# Patient Record
Sex: Female | Born: 1960 | Race: White | Hispanic: No | Marital: Married | Smoking: Former smoker
Health system: Southern US, Community
[De-identification: ages and names within clinical notes are randomized; demographics above are authoritative.]

## PROBLEM LIST (undated history)

## (undated) DIAGNOSIS — F329 Major depressive disorder, single episode, unspecified: Secondary | ICD-10-CM

## (undated) DIAGNOSIS — F410 Panic disorder [episodic paroxysmal anxiety] without agoraphobia: Secondary | ICD-10-CM

## (undated) DIAGNOSIS — G4730 Sleep apnea, unspecified: Secondary | ICD-10-CM

## (undated) DIAGNOSIS — D649 Anemia, unspecified: Secondary | ICD-10-CM

## (undated) DIAGNOSIS — K5790 Diverticulosis of intestine, part unspecified, without perforation or abscess without bleeding: Secondary | ICD-10-CM

## (undated) DIAGNOSIS — F32A Depression, unspecified: Secondary | ICD-10-CM

## (undated) DIAGNOSIS — G4733 Obstructive sleep apnea (adult) (pediatric): Secondary | ICD-10-CM

## (undated) DIAGNOSIS — R42 Dizziness and giddiness: Secondary | ICD-10-CM

## (undated) DIAGNOSIS — K219 Gastro-esophageal reflux disease without esophagitis: Secondary | ICD-10-CM

## (undated) DIAGNOSIS — I10 Essential (primary) hypertension: Secondary | ICD-10-CM

## (undated) DIAGNOSIS — T753XXA Motion sickness, initial encounter: Secondary | ICD-10-CM

## (undated) DIAGNOSIS — M1990 Unspecified osteoarthritis, unspecified site: Secondary | ICD-10-CM

## (undated) DIAGNOSIS — E785 Hyperlipidemia, unspecified: Secondary | ICD-10-CM

## (undated) DIAGNOSIS — E7800 Pure hypercholesterolemia, unspecified: Secondary | ICD-10-CM

## (undated) DIAGNOSIS — T7840XA Allergy, unspecified, initial encounter: Secondary | ICD-10-CM

## (undated) DIAGNOSIS — Z8619 Personal history of other infectious and parasitic diseases: Secondary | ICD-10-CM

## (undated) DIAGNOSIS — C801 Malignant (primary) neoplasm, unspecified: Secondary | ICD-10-CM

## (undated) DIAGNOSIS — Z820 Family history of epilepsy and other diseases of the nervous system: Secondary | ICD-10-CM

## (undated) HISTORY — DX: Essential (primary) hypertension: I10

## (undated) HISTORY — DX: Family history of epilepsy and other diseases of the nervous system: Z82.0

## (undated) HISTORY — PX: FRACTURE SURGERY: SHX138

## (undated) HISTORY — DX: Major depressive disorder, single episode, unspecified: F32.9

## (undated) HISTORY — DX: Unspecified osteoarthritis, unspecified site: M19.90

## (undated) HISTORY — DX: Pure hypercholesterolemia, unspecified: E78.00

## (undated) HISTORY — PX: ESOPHAGOGASTRODUODENOSCOPY: SHX1529

## (undated) HISTORY — DX: Diverticulosis of intestine, part unspecified, without perforation or abscess without bleeding: K57.90

## (undated) HISTORY — DX: Anemia, unspecified: D64.9

## (undated) HISTORY — DX: Depression, unspecified: F32.A

## (undated) HISTORY — PX: ABDOMINAL HYSTERECTOMY: SHX81

## (undated) HISTORY — PX: COLONOSCOPY: SHX174

## (undated) HISTORY — DX: Allergy, unspecified, initial encounter: T78.40XA

## (undated) HISTORY — DX: Personal history of other infectious and parasitic diseases: Z86.19

---
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
## 1985-10-13 HISTORY — PX: OTHER SURGICAL HISTORY: SHX169

## 1991-10-14 HISTORY — PX: TUBAL LIGATION: SHX77

## 2004-10-13 HISTORY — PX: LAPAROSCOPIC SUPRACERVICAL HYSTERECTOMY: SUR797

## 2005-01-07 ENCOUNTER — Ambulatory Visit: Payer: Self-pay | Admitting: Internal Medicine

## 2005-05-13 ENCOUNTER — Ambulatory Visit: Payer: Self-pay | Admitting: Internal Medicine

## 2005-06-30 ENCOUNTER — Ambulatory Visit: Payer: Self-pay | Admitting: Obstetrics and Gynecology

## 2006-08-31 ENCOUNTER — Ambulatory Visit: Payer: Self-pay | Admitting: Internal Medicine

## 2006-09-04 ENCOUNTER — Emergency Department: Payer: Self-pay | Admitting: Emergency Medicine

## 2006-09-07 ENCOUNTER — Ambulatory Visit: Payer: Self-pay | Admitting: General Practice

## 2006-09-08 ENCOUNTER — Other Ambulatory Visit: Payer: Self-pay

## 2007-09-03 ENCOUNTER — Ambulatory Visit: Payer: Self-pay | Admitting: Internal Medicine

## 2007-10-19 ENCOUNTER — Ambulatory Visit: Payer: Self-pay | Admitting: Gastroenterology

## 2007-10-21 ENCOUNTER — Ambulatory Visit: Payer: Self-pay | Admitting: Internal Medicine

## 2007-10-27 ENCOUNTER — Ambulatory Visit: Payer: Self-pay | Admitting: Gastroenterology

## 2007-12-07 ENCOUNTER — Ambulatory Visit: Payer: Self-pay | Admitting: Gastroenterology

## 2008-03-01 ENCOUNTER — Emergency Department: Payer: Self-pay | Admitting: Emergency Medicine

## 2008-09-04 ENCOUNTER — Ambulatory Visit: Payer: Self-pay | Admitting: Internal Medicine

## 2008-09-12 ENCOUNTER — Ambulatory Visit: Payer: Self-pay | Admitting: Gastroenterology

## 2008-12-26 ENCOUNTER — Ambulatory Visit: Payer: Self-pay | Admitting: Orthopedic Surgery

## 2009-01-02 ENCOUNTER — Ambulatory Visit: Payer: Self-pay | Admitting: Orthopedic Surgery

## 2009-09-10 ENCOUNTER — Ambulatory Visit: Payer: Self-pay | Admitting: Internal Medicine

## 2009-09-10 LAB — HM MAMMOGRAPHY

## 2009-11-09 ENCOUNTER — Ambulatory Visit: Payer: Self-pay | Admitting: Orthopedic Surgery

## 2009-11-16 ENCOUNTER — Ambulatory Visit: Payer: Self-pay | Admitting: Orthopedic Surgery

## 2011-09-08 ENCOUNTER — Ambulatory Visit: Payer: Self-pay | Admitting: Internal Medicine

## 2012-04-29 LAB — HM PAP SMEAR

## 2012-04-30 LAB — BASIC METABOLIC PANEL
Creatinine: 0.8 mg/dL (ref <=1.1)
Glucose: 88 mg/dL
Potassium: 4.9 mmol/L (ref 3.4–5.3)
Sodium: 137 mmol/L (ref 137–147)

## 2012-04-30 LAB — TSH: TSH: 1.05 u[IU]/mL (ref <=5.90)

## 2012-04-30 LAB — LIPID PANEL
Cholesterol: 155 mg/dL (ref 0–200)
HDL: 57 mg/dL (ref 35–70)

## 2012-04-30 LAB — HEPATIC FUNCTION PANEL: AST: 25 U/L (ref 13–35)

## 2012-10-14 ENCOUNTER — Other Ambulatory Visit: Payer: Self-pay | Admitting: Internal Medicine

## 2012-10-14 MED ORDER — AMLODIPINE BESYLATE 5 MG PO TABS: 5.0000 mg | ORAL_TABLET | Freq: Two times a day (BID) | ORAL | Status: DC

## 2012-10-14 NOTE — Telephone Encounter (Signed)
Sent in to pharmacy.  

## 2012-10-14 NOTE — Telephone Encounter (Signed)
Patient has an appointment on 3.18.14  Amlodipine Besylate 5 mg  Take 1 tablet by mouth twice daily  # 60

## 2012-12-21 ENCOUNTER — Encounter: Payer: Self-pay | Admitting: *Deleted

## 2012-12-23 ENCOUNTER — Encounter: Payer: Self-pay | Admitting: *Deleted

## 2012-12-28 ENCOUNTER — Ambulatory Visit: Payer: Self-pay | Admitting: Internal Medicine

## 2013-01-05 ENCOUNTER — Other Ambulatory Visit: Payer: Self-pay | Admitting: *Deleted

## 2013-01-06 MED ORDER — SPIRONOLACTONE 50 MG PO TABS: 50.0000 mg | ORAL_TABLET | Freq: Every day | ORAL | Status: DC

## 2013-01-06 NOTE — Telephone Encounter (Signed)
Rx sent in to pharmacy. 

## 2013-02-01 ENCOUNTER — Ambulatory Visit: Payer: Self-pay | Admitting: Internal Medicine

## 2013-02-09 ENCOUNTER — Ambulatory Visit (INDEPENDENT_AMBULATORY_CARE_PROVIDER_SITE_OTHER): Payer: Managed Care, Other (non HMO) | Admitting: Internal Medicine

## 2013-02-09 ENCOUNTER — Encounter: Payer: Self-pay | Admitting: Internal Medicine

## 2013-02-09 VITALS — BP 130/90 | HR 92 | Temp 98.9°F | Ht 68.0 in | Wt 271.8 lb

## 2013-02-09 DIAGNOSIS — F419 Anxiety disorder, unspecified: Secondary | ICD-10-CM

## 2013-02-09 DIAGNOSIS — E7800 Pure hypercholesterolemia, unspecified: Secondary | ICD-10-CM

## 2013-02-09 DIAGNOSIS — K5790 Diverticulosis of intestine, part unspecified, without perforation or abscess without bleeding: Secondary | ICD-10-CM

## 2013-02-09 DIAGNOSIS — I10 Essential (primary) hypertension: Secondary | ICD-10-CM

## 2013-02-09 DIAGNOSIS — F411 Generalized anxiety disorder: Secondary | ICD-10-CM

## 2013-02-09 DIAGNOSIS — K5730 Diverticulosis of large intestine without perforation or abscess without bleeding: Secondary | ICD-10-CM

## 2013-02-09 LAB — BASIC METABOLIC PANEL
BUN: 10 mg/dL (ref 6–23)
Calcium: 9.2 mg/dL (ref 8.4–10.5)
GFR: 136.7 mL/min (ref >60.00)
Potassium: 4.4 mEq/L (ref 3.5–5.1)
Sodium: 137 mEq/L (ref 135–145)

## 2013-02-09 LAB — CBC WITH DIFFERENTIAL/PLATELET
Basophils Relative: 0.9 % (ref 0.0–3.0)
Eosinophils Absolute: 0.1 10*3/uL (ref 0.0–0.7)
MCHC: 34.2 g/dL (ref 30.0–36.0)
MCV: 96.4 fl (ref 78.0–100.0)
Monocytes Absolute: 0.5 10*3/uL (ref 0.1–1.0)
Neutrophils Relative %: 52.1 % (ref 43.0–77.0)
Platelets: 243 10*3/uL (ref 150.0–400.0)

## 2013-02-09 LAB — HEPATIC FUNCTION PANEL
ALT: 20 U/L (ref 0–35)
AST: 24 U/L (ref 0–37)
Bilirubin, Direct: 0.1 mg/dL (ref 0.0–0.3)
Total Protein: 7.5 g/dL (ref 6.0–8.3)

## 2013-02-09 LAB — LIPID PANEL
Cholesterol: 175 mg/dL (ref 0–200)
VLDL: 25.6 mg/dL (ref 0.0–40.0)

## 2013-02-09 MED ORDER — ALPRAZOLAM 0.25 MG PO TABS: 0.2500 mg | ORAL_TABLET | Freq: Two times a day (BID) | ORAL | Status: DC | PRN

## 2013-02-10 ENCOUNTER — Encounter: Payer: Self-pay | Admitting: Internal Medicine

## 2013-02-10 ENCOUNTER — Ambulatory Visit: Payer: Managed Care, Other (non HMO)

## 2013-02-10 DIAGNOSIS — E039 Hypothyroidism, unspecified: Secondary | ICD-10-CM

## 2013-02-10 DIAGNOSIS — I10 Essential (primary) hypertension: Secondary | ICD-10-CM | POA: Insufficient documentation

## 2013-02-10 DIAGNOSIS — F419 Anxiety disorder, unspecified: Secondary | ICD-10-CM | POA: Insufficient documentation

## 2013-02-10 DIAGNOSIS — E7800 Pure hypercholesterolemia, unspecified: Secondary | ICD-10-CM | POA: Insufficient documentation

## 2013-02-10 DIAGNOSIS — K5790 Diverticulosis of intestine, part unspecified, without perforation or abscess without bleeding: Secondary | ICD-10-CM | POA: Insufficient documentation

## 2013-02-10 LAB — T4, FREE: Free T4: 0.63 ng/dL (ref 0.60–1.60)

## 2013-02-10 NOTE — Assessment & Plan Note (Signed)
Colonoscopy 12/09 - internal hemorrhoids and diverticulosis.  Currently asymptomatic.  Recommended f/u colonoscopy 2019.    
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
## 2013-02-10 NOTE — Assessment & Plan Note (Signed)
Blood pressure slightly elevated today.  Have her check and record her pressure and send in over the next few weeks.  Hold on making changes in her medication.  Follow.  Check metabolic panel.

## 2013-02-10 NOTE — Assessment & Plan Note (Signed)
Remains on Vytorin.  Low cholesterol diet and exercise.  Check lipid panel and liver function.

## 2013-02-10 NOTE — Assessment & Plan Note (Signed)
Increased anxiety with the weather as outlined.  On citalopram and has done well on this medication.  Will give her xanax .25mg  to have if needed.  Discussed referral to a counselor/psychiatrist.  She wants to hold at this point.  Follow.  Notify me if worsening symptoms.

## 2013-02-11 ENCOUNTER — Other Ambulatory Visit: Payer: Self-pay | Admitting: Internal Medicine

## 2013-02-11 ENCOUNTER — Encounter: Payer: Self-pay | Admitting: *Deleted

## 2013-02-11 DIAGNOSIS — R7989 Other specified abnormal findings of blood chemistry: Secondary | ICD-10-CM

## 2013-02-11 NOTE — Telephone Encounter (Signed)
Rx sent to pharmacy by escript  

## 2013-02-11 NOTE — Progress Notes (Signed)
Follow up thyroid function tests ordered.

## 2013-02-28 ENCOUNTER — Other Ambulatory Visit: Payer: Self-pay | Admitting: Internal Medicine

## 2013-02-28 ENCOUNTER — Encounter: Payer: Self-pay | Admitting: Internal Medicine

## 2013-02-28 NOTE — Telephone Encounter (Signed)
Rx sent to pharmacy by escript  

## 2013-03-01 ENCOUNTER — Other Ambulatory Visit: Payer: Self-pay | Admitting: Internal Medicine

## 2013-03-01 NOTE — Telephone Encounter (Signed)
Ok to refill 

## 2013-03-01 NOTE — Telephone Encounter (Signed)
Rx faxed to pharmacy  

## 2013-03-15 ENCOUNTER — Other Ambulatory Visit (INDEPENDENT_AMBULATORY_CARE_PROVIDER_SITE_OTHER): Payer: Managed Care, Other (non HMO)

## 2013-03-15 DIAGNOSIS — R7989 Other specified abnormal findings of blood chemistry: Secondary | ICD-10-CM

## 2013-03-15 DIAGNOSIS — R946 Abnormal results of thyroid function studies: Secondary | ICD-10-CM

## 2013-03-15 LAB — TSH: TSH: 0.45 u[IU]/mL (ref 0.35–5.50)

## 2013-03-15 LAB — T3, FREE: T3, Free: 2.8 pg/mL (ref 2.3–4.2)

## 2013-03-16 ENCOUNTER — Encounter: Payer: Self-pay | Admitting: Internal Medicine

## 2013-03-21 ENCOUNTER — Telehealth: Payer: Self-pay

## 2013-03-21 NOTE — Telephone Encounter (Signed)
My Chart Message: I reviewed your recent lab results. Your thyroid function tests are within normal limits.   Patient notified of recent thyroid function tests are within normal limits

## 2013-03-31 ENCOUNTER — Encounter: Payer: Self-pay | Admitting: Internal Medicine

## 2013-04-14 ENCOUNTER — Encounter: Payer: Self-pay | Admitting: Internal Medicine

## 2013-04-18 ENCOUNTER — Other Ambulatory Visit: Payer: Self-pay | Admitting: *Deleted

## 2013-04-18 MED ORDER — PANTOPRAZOLE SODIUM 40 MG PO TBEC: 40.0000 mg | DELAYED_RELEASE_TABLET | Freq: Two times a day (BID) | ORAL | Status: DC

## 2013-04-19 ENCOUNTER — Telehealth: Payer: Self-pay | Admitting: *Deleted

## 2013-04-19 NOTE — Telephone Encounter (Signed)
Called [phone] for Prior authorization on the Pantoprazole 40 mg, form is being faxed over

## 2013-04-21 ENCOUNTER — Telehealth: Payer: Self-pay | Admitting: *Deleted

## 2013-04-21 NOTE — Telephone Encounter (Signed)
Richard @ [facility] called to check on the status of the Pantoprazole PA  (Member ID# [id number])

## 2013-04-21 NOTE — Telephone Encounter (Signed)
Waiting for the PA request form from the insurance

## 2013-04-22 NOTE — Telephone Encounter (Signed)
Received PA request, placed in Dr. Lorin Picket folder

## 2013-04-26 ENCOUNTER — Other Ambulatory Visit: Payer: Self-pay | Admitting: Internal Medicine

## 2013-04-27 ENCOUNTER — Other Ambulatory Visit: Payer: Self-pay | Admitting: Internal Medicine

## 2013-05-02 ENCOUNTER — Ambulatory Visit (INDEPENDENT_AMBULATORY_CARE_PROVIDER_SITE_OTHER): Payer: Managed Care, Other (non HMO) | Admitting: Internal Medicine

## 2013-05-02 ENCOUNTER — Encounter: Payer: Self-pay | Admitting: Internal Medicine

## 2013-05-02 ENCOUNTER — Telehealth: Payer: Self-pay | Admitting: *Deleted

## 2013-05-02 VITALS — BP 130/80 | HR 95 | Temp 98.5°F | Ht 68.0 in | Wt 267.2 lb

## 2013-05-02 DIAGNOSIS — K5790 Diverticulosis of intestine, part unspecified, without perforation or abscess without bleeding: Secondary | ICD-10-CM

## 2013-05-02 DIAGNOSIS — F411 Generalized anxiety disorder: Secondary | ICD-10-CM

## 2013-05-02 DIAGNOSIS — E7800 Pure hypercholesterolemia, unspecified: Secondary | ICD-10-CM

## 2013-05-02 DIAGNOSIS — I10 Essential (primary) hypertension: Secondary | ICD-10-CM

## 2013-05-02 DIAGNOSIS — F419 Anxiety disorder, unspecified: Secondary | ICD-10-CM

## 2013-05-02 DIAGNOSIS — K5730 Diverticulosis of large intestine without perforation or abscess without bleeding: Secondary | ICD-10-CM

## 2013-05-02 DIAGNOSIS — Z23 Encounter for immunization: Secondary | ICD-10-CM
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
## 2013-05-02 NOTE — Assessment & Plan Note (Signed)
Colonoscopy 12/09 - internal hemorrhoids and diverticulosis.  Currently asymptomatic.  Recommended f/u colonoscopy 2019.    

## 2013-05-02 NOTE — Addendum Note (Signed)
Addended by: Warden Fillers on: 05/02/2013 11:19 AM   Modules accepted: Orders

## 2013-05-02 NOTE — Telephone Encounter (Signed)
I think you have this form.  Thanks.
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
## 2013-05-02 NOTE — Assessment & Plan Note (Signed)
Remains on Vytorin.  Low cholesterol diet and exercise.  Follow lipid panel and liver function.   

## 2013-05-02 NOTE — Assessment & Plan Note (Signed)
Blood pressure better.  Follow metabolic panel.    

## 2013-05-02 NOTE — Telephone Encounter (Signed)
Pt wanted to check on the status of her Protonix Prior authorization-pt in office now for a OV.

## 2013-05-04 ENCOUNTER — Encounter: Payer: Self-pay | Admitting: *Deleted

## 2013-05-09 ENCOUNTER — Encounter: Payer: Managed Care, Other (non HMO) | Admitting: Internal Medicine

## 2013-05-10 ENCOUNTER — Other Ambulatory Visit: Payer: Self-pay | Admitting: Internal Medicine

## 2013-06-06 ENCOUNTER — Encounter: Payer: Self-pay | Admitting: Internal Medicine

## 2013-06-14 ENCOUNTER — Encounter: Payer: Managed Care, Other (non HMO) | Admitting: Internal Medicine

## 2013-06-21 ENCOUNTER — Other Ambulatory Visit (INDEPENDENT_AMBULATORY_CARE_PROVIDER_SITE_OTHER): Payer: Managed Care, Other (non HMO)

## 2013-06-21 DIAGNOSIS — F411 Generalized anxiety disorder: Secondary | ICD-10-CM

## 2013-06-21 DIAGNOSIS — E7800 Pure hypercholesterolemia, unspecified: Secondary | ICD-10-CM

## 2013-06-21 DIAGNOSIS — I10 Essential (primary) hypertension: Secondary | ICD-10-CM

## 2013-06-21 DIAGNOSIS — F419 Anxiety disorder, unspecified: Secondary | ICD-10-CM

## 2013-06-21 LAB — LIPID PANEL
Cholesterol: 169 mg/dL (ref 0–200)
HDL: 49.1 mg/dL (ref >39.00)
LDL Cholesterol: 83 mg/dL (ref 0–99)
Triglycerides: 187 mg/dL — ABNORMAL HIGH (ref 0.0–149.0)
VLDL: 37.4 mg/dL (ref 0.0–40.0)

## 2013-06-21 LAB — TSH: TSH: 0.53 u[IU]/mL (ref 0.35–5.50)

## 2013-06-21 LAB — HEPATIC FUNCTION PANEL
Albumin: 4.5 g/dL (ref 3.5–5.2)
Bilirubin, Direct: 0.1 mg/dL (ref 0.0–0.3)
Total Protein: 7.6 g/dL (ref 6.0–8.3)

## 2013-06-21 LAB — BASIC METABOLIC PANEL
BUN: 14 mg/dL (ref 6–23)
CO2: 27 mEq/L (ref 19–32)
Chloride: 103 mEq/L (ref 96–112)
Creatinine, Ser: 0.5 mg/dL (ref 0.4–1.2)
Glucose, Bld: 96 mg/dL (ref 70–99)

## 2013-06-23 ENCOUNTER — Telehealth: Payer: Self-pay | Admitting: Internal Medicine

## 2013-06-23 NOTE — Telephone Encounter (Signed)
Hold form until her physical.

## 2013-06-23 NOTE — Telephone Encounter (Signed)
Pt dropped off health screening form  In box

## 2013-06-23 NOTE — Telephone Encounter (Signed)
In your folder-I completed the lab portion. She has an appt on 9/16 for physical (in case you want me to hold it until then)

## 2013-06-24 NOTE — Telephone Encounter (Signed)
Noted & pt aware also

## 2013-06-28 ENCOUNTER — Other Ambulatory Visit (HOSPITAL_COMMUNITY)
Admission: RE | Admit: 2013-06-28 | Discharge: 2013-06-28 | Disposition: A | Discharge status: Home / Self Care | Payer: Managed Care, Other (non HMO) | Source: Ambulatory Visit | Attending: Internal Medicine | Admitting: Internal Medicine

## 2013-06-28 ENCOUNTER — Encounter: Payer: Self-pay | Admitting: Internal Medicine

## 2013-06-28 ENCOUNTER — Ambulatory Visit (INDEPENDENT_AMBULATORY_CARE_PROVIDER_SITE_OTHER): Payer: Managed Care, Other (non HMO) | Admitting: Internal Medicine

## 2013-06-28 VITALS — BP 120/80 | HR 82 | Temp 98.7°F | Ht 67.75 in | Wt 267.5 lb

## 2013-06-28 DIAGNOSIS — Z124 Encounter for screening for malignant neoplasm of cervix: Secondary | ICD-10-CM

## 2013-06-28 DIAGNOSIS — Z1151 Encounter for screening for human papillomavirus (HPV): Secondary | ICD-10-CM | POA: Insufficient documentation

## 2013-06-28 DIAGNOSIS — R079 Chest pain, unspecified: Secondary | ICD-10-CM

## 2013-06-28 DIAGNOSIS — K5730 Diverticulosis of large intestine without perforation or abscess without bleeding: Secondary | ICD-10-CM

## 2013-06-28 DIAGNOSIS — E7800 Pure hypercholesterolemia, unspecified: Secondary | ICD-10-CM

## 2013-06-28 DIAGNOSIS — Z23 Encounter for immunization: Secondary | ICD-10-CM

## 2013-06-28 DIAGNOSIS — F419 Anxiety disorder, unspecified: Secondary | ICD-10-CM

## 2013-06-28 DIAGNOSIS — F411 Generalized anxiety disorder: Secondary | ICD-10-CM

## 2013-06-28 DIAGNOSIS — K5790 Diverticulosis of intestine, part unspecified, without perforation or abscess without bleeding: Secondary | ICD-10-CM

## 2013-06-28 DIAGNOSIS — Z01419 Encounter for gynecological examination (general) (routine) without abnormal findings: Secondary | ICD-10-CM | POA: Insufficient documentation

## 2013-06-28 DIAGNOSIS — I10 Essential (primary) hypertension: Secondary | ICD-10-CM

## 2013-07-01 ENCOUNTER — Encounter: Payer: Self-pay | Admitting: Internal Medicine

## 2013-07-02 ENCOUNTER — Other Ambulatory Visit: Payer: Self-pay | Admitting: Internal Medicine

## 2013-07-02 ENCOUNTER — Encounter: Payer: Self-pay | Admitting: Internal Medicine

## 2013-07-02 NOTE — Assessment & Plan Note (Signed)
Blood pressure better.  Follow metabolic panel.    

## 2013-07-02 NOTE — Progress Notes (Signed)
Subjective:    [name], female    DOB: 03-30-1961, 52 y.o.   MRN: [id number]  HPI 52 year old female with past history of GERD, hypertension and hypercholesterolemia who comes in today to follow up on these issues as well as for a complete physical exam.  She states she has been doing relatively well.  Her daughter and granddaughter have moved to [place].  States she is coping well with this.  No increased cough or congestion.  Does report some sob with exertion.  Unable to do a lot of walking secondary to increased sob.  Some chest pressure with exertion.  No acid reflux.  Bowels stable.  No depression.  Anxiety better.  Overall feels good.     Past Medical History  Diagnosis Date  . Hypercholesterolemia   . Diverticulosis   . Anemia   . Hypertension   . Depression   . Allergy   . History of chickenpox   . Osteoarthritis   . FHx: migraine headaches     Outpatient Encounter Prescriptions as of 06/28/2013  Medication Sig Dispense Refill  . acetaminophen (TYLENOL) 650 MG CR tablet Take 650 mg by mouth every 8 (eight) hours as needed for pain.      Marland Kitchen ALPRAZolam (XANAX) 0.25 MG tablet TAKE 1 TABLET BY MOUTH TWICE DAILY AS NEEDED FOR SLEEP  20 tablet  0  . amLODipine (NORVASC) 5 MG tablet TAKE 1 TABLET BY MOUTH TWICE DAILY  60 tablet  5  . atorvastatin (LIPITOR) 20 MG tablet Take 1 tablet (20 mg total) by mouth daily.  30 tablet  5  . Calcium Carbonate-Vit D-Min (CALCIUM 1200 PO) Take by mouth daily.      . Cholecalciferol (VITAMIN D PO) Take 800 Units by mouth daily.      . citalopram (CELEXA) 40 MG tablet TAKE ONE TABLET BY MOUTH DAILY  30 tablet  2  . Lactase (LACTAID FAST ACT) 9000 UNITS TABS Take by mouth.      Marland Kitchen lisinopril (PRINIVIL,ZESTRIL) 40 MG tablet TAKE 1 TABLET BY MOUTH EVERY DAY  30 tablet  5  . Methylcellulose, Laxative, (CITRUCEL PO) Take by mouth.      . metoprolol succinate (TOPROL-XL) 50 MG 24 hr tablet TAKE ONE TABLET BY MOUTH TWICE DAILY  60 tablet  5   . pantoprazole (PROTONIX) 40 MG tablet Take 1 tablet (40 mg total) by mouth 2 (two) times daily.  60 tablet  5  . spironolactone (ALDACTONE) 50 MG tablet TAKE 1 TABLET BY MOUTH EVERY DAY  30 tablet  5   No facility-administered encounter medications on file as of 06/28/2013.    Review of Systems Patient denies any headache, lightheadedness or dizziness.  No cough or congestion.  Some sob with exertion.  Some chest pressure with exertion.  No nausea or vomiting.  No acid reflux.  No abdominal pain or cramping.  No bowel change, such as diarrhea, constipation, BRBPR or melana.  No urine change.  Anxiety better.  No depression.        Objective:   Physical Exam  Filed Vitals:   06/28/13 1537  BP: 120/80  Pulse: 82  Temp: 98.7 F (36.69 C)   52 year old female in no acute distress.   HEENT:  Nares- clear.  Oropharynx - without lesions. NECK:  Supple.  Nontender.  No audible bruit.  HEART:  Appears to be regular. LUNGS:  No crackles or wheezing audible.  Respirations even and unlabored.  RADIAL  PULSE:  Equal bilaterally.    BREASTS:  No nipple discharge or nipple retraction present.  Could not appreciate any distinct nodules or axillary adenopathy.  ABDOMEN:  Soft, nontender.  Bowel sounds present and normal.  No audible abdominal bruit.  GU:  Normal external genitalia.  Vaginal vault without lesions.  Cervix identified.  Pap performed. Could not appreciate any adnexal masses or tenderness.   RECTAL:  Heme negative.   EXTREMITIES:  No increased edema present.  DP pulses palpable and equal bilaterally.          Assessment & Plan:  CARDIOVASCULAR.  Sob and chest tightness with exertion.  Some worsening symptoms.  EKG obtained and revealed SR with TWI in Lead III and flattening in aVF.  Given risk factors, symptoms and EKG - obtain stress test to confirm no active ischemia.  Continue risk factor modification.    PULMONARY.  Some sob with exertion.  Pursue cardiac w/up as outlined.      MSK.  Saw Dr Rosita Kea. S/p injection of her shoulder.  Did not help.  Saw Dr Yves Dill.  Repeat injection.  Pain resolved.    HEALTH MAINTENANCE.  Physical today.  Colonoscopy 09/12/08 - internal hemorrhoids and diverticulosis.  Recommended f/u colonoscopy 2019.  Had mammogram 02/01/13 - Birads I.

## 2013-07-02 NOTE — Assessment & Plan Note (Signed)
Remains on Vytorin.  Low cholesterol diet and exercise.  Follow lipid panel and liver function.   

## 2013-07-02 NOTE — Assessment & Plan Note (Signed)
Colonoscopy 12/09 - internal hemorrhoids and diverticulosis.  Currently asymptomatic.  Recommended f/u colonoscopy 2019.    

## 2013-07-02 NOTE — Assessment & Plan Note (Signed)
Anxiety better.  On citalopram and has done well on this medication.  She has xanax .25mg  to have if needed.  Works well for her.  Rarely takes.  Follow.

## 2013-07-04 NOTE — Telephone Encounter (Signed)
Mailed unread message to pt  

## 2013-07-21 ENCOUNTER — Other Ambulatory Visit: Payer: Self-pay | Admitting: Internal Medicine

## 2013-07-22 ENCOUNTER — Other Ambulatory Visit (INDEPENDENT_AMBULATORY_CARE_PROVIDER_SITE_OTHER): Payer: Managed Care, Other (non HMO)

## 2013-07-22 DIAGNOSIS — R079 Chest pain, unspecified: Secondary | ICD-10-CM

## 2013-08-07 ENCOUNTER — Other Ambulatory Visit: Payer: Self-pay | Admitting: Internal Medicine

## 2013-08-19 ENCOUNTER — Other Ambulatory Visit: Payer: Self-pay | Admitting: Internal Medicine

## 2013-09-01 ENCOUNTER — Ambulatory Visit (INDEPENDENT_AMBULATORY_CARE_PROVIDER_SITE_OTHER): Payer: Managed Care, Other (non HMO) | Admitting: Internal Medicine

## 2013-09-01 ENCOUNTER — Encounter: Payer: Self-pay | Admitting: Internal Medicine

## 2013-09-01 VITALS — BP 118/78 | HR 74 | Temp 98.1°F | Wt 271.2 lb

## 2013-09-01 DIAGNOSIS — K5730 Diverticulosis of large intestine without perforation or abscess without bleeding: Secondary | ICD-10-CM

## 2013-09-01 DIAGNOSIS — E7800 Pure hypercholesterolemia, unspecified: Secondary | ICD-10-CM

## 2013-09-01 DIAGNOSIS — I10 Essential (primary) hypertension: Secondary | ICD-10-CM

## 2013-09-01 DIAGNOSIS — F411 Generalized anxiety disorder: Secondary | ICD-10-CM

## 2013-09-01 DIAGNOSIS — F419 Anxiety disorder, unspecified: Secondary | ICD-10-CM

## 2013-09-01 DIAGNOSIS — K5790 Diverticulosis of intestine, part unspecified, without perforation or abscess without bleeding: Secondary | ICD-10-CM

## 2013-09-01 MED ORDER — ALPRAZOLAM 0.25 MG PO TABS: ORAL_TABLET | ORAL | Status: DC

## 2013-09-01 NOTE — Progress Notes (Signed)
Pre-visit discussion using our clinic review tool. No additional management support is needed unless otherwise documented below in the visit note.  

## 2013-09-04 ENCOUNTER — Encounter: Payer: Self-pay | Admitting: Internal Medicine

## 2013-09-04 NOTE — Progress Notes (Signed)
Subjective:    [name], female    DOB: February 01, 1961, 52 y.o.   MRN: [id number]  HPI 52 year old female with past history of GERD, hypertension and hypercholesterolemia who comes in today for a scheduled follow up.  She states she has been doing relatively well.  Doing better.  Stress better.   No increased cough or congestion.  Did not report any sob today.  No chest pain or tightness.  Recent stress test negative.   No acid reflux.  Bowels stable.  No depression.  Anxiety better.  Overall feels good.     Past Medical History  Diagnosis Date  . Hypercholesterolemia   . Diverticulosis   . Anemia   . Hypertension   . Depression   . Allergy   . History of chickenpox   . Osteoarthritis   . FHx: migraine headaches     Outpatient Encounter Prescriptions as of 09/01/2013  Medication Sig  . acetaminophen (TYLENOL) 650 MG CR tablet Take 650 mg by mouth every 8 (eight) hours as needed for pain.  Marland Kitchen ALPRAZolam (XANAX) 0.25 MG tablet TAKE 1 TABLET BY MOUTH TWICE DAILY AS NEEDED FOR SLEEP  . amLODipine (NORVASC) 5 MG tablet TAKE 1 TABLET BY MOUTH TWICE DAILY  . atorvastatin (LIPITOR) 20 MG tablet Take 1 tablet (20 mg total) by mouth daily.  . citalopram (CELEXA) 40 MG tablet TAKE 1 TABLET BY MOUTH EVERY DAY  . Lactase (LACTAID FAST ACT) 9000 UNITS TABS Take by mouth.  Marland Kitchen lisinopril (PRINIVIL,ZESTRIL) 40 MG tablet TAKE 1 TABLET BY MOUTH EVERY DAY  . metoprolol succinate (TOPROL-XL) 50 MG 24 hr tablet TAKE ONE TABLET BY MOUTH TWICE DAILY  . pantoprazole (PROTONIX) 40 MG tablet Take 40 mg by mouth daily.  . ranitidine (ZANTAC) 150 MG tablet Take 150 mg by mouth at bedtime.  Marland Kitchen spironolactone (ALDACTONE) 50 MG tablet TAKE 1 TABLET BY MOUTH EVERY DAY  . [DISCONTINUED] ALPRAZolam (XANAX) 0.25 MG tablet TAKE 1 TABLET BY MOUTH TWICE DAILY AS NEEDED FOR SLEEP  . [DISCONTINUED] pantoprazole (PROTONIX) 40 MG tablet Take 1 tablet (40 mg total) by mouth 2 (two) times daily.  . [DISCONTINUED]  Calcium Carbonate-Vit D-Min (CALCIUM 1200 PO) Take by mouth daily.  . [DISCONTINUED] Cholecalciferol (VITAMIN D PO) Take 800 Units by mouth daily.  . [DISCONTINUED] Methylcellulose, Laxative, (CITRUCEL PO) Take by mouth.    Review of Systems Patient denies any headache, lightheadedness or dizziness.  No cough or congestion.  Breathing stable.  No chest pain or pressure reported.   No nausea or vomiting.  No acid reflux.  No abdominal pain or cramping.  No bowel change, such as diarrhea, constipation, BRBPR or melana.  No urine change.  Anxiety better.  No depression.        Objective:   Physical Exam  Filed Vitals:   09/01/13 1123  BP: 118/78  Pulse: 74  Temp: 98.1 F (38.13 C)   52 year old female in no acute distress.   HEENT:  Nares- clear.  Oropharynx - without lesions. NECK:  Supple.  Nontender.  No audible bruit.  HEART:  Appears to be regular. LUNGS:  No crackles or wheezing audible.  Respirations even and unlabored.  RADIAL PULSE:  Equal bilaterally.  ABDOMEN:  Soft, nontender.  Bowel sounds present and normal.  No audible abdominal bruit.    EXTREMITIES:  No increased edema present.  DP pulses palpable and equal bilaterally.          Assessment &  Plan:  CARDIOVASCULAR.  No chest pain or tightness reported today.  Recent stress test negative.      PULMONARY.  Breathing ok.  No sob reported today.      MSK.  Saw Dr Rosita Kea. S/p injection of her shoulder.  Did not help.  Saw Dr Yves Dill.  Repeat injection.  Pain resolved.    HEALTH MAINTENANCE.  Physical last visit.   Colonoscopy 09/12/08 - internal hemorrhoids and diverticulosis.  Recommended f/u colonoscopy 2019.  Had mammogram 02/01/13 - Birads I.

## 2013-09-04 NOTE — Assessment & Plan Note (Signed)
Anxiety better.  On citalopram and has done well on this medication.  She has xanax .25mg  to have if needed.  Works well for her.  Rarely takes.  Follow.

## 2013-09-04 NOTE — Assessment & Plan Note (Signed)
Colonoscopy 12/09 - internal hemorrhoids and diverticulosis.  Currently asymptomatic.  Recommended f/u colonoscopy 2019.    

## 2013-09-04 NOTE — Assessment & Plan Note (Signed)
Remains on Vytorin.  Low cholesterol diet and exercise.  Follow lipid panel and liver function.   

## 2013-09-04 NOTE — Assessment & Plan Note (Signed)
Blood pressure better.  Follow metabolic panel.    

## 2013-09-05 ENCOUNTER — Other Ambulatory Visit: Payer: Self-pay | Admitting: Internal Medicine

## 2013-09-05 MED ORDER — PANTOPRAZOLE SODIUM 40 MG PO TBEC: 40.0000 mg | DELAYED_RELEASE_TABLET | Freq: Every day | ORAL | Status: DC

## 2013-09-05 NOTE — Telephone Encounter (Signed)
Rx was printed 09/01/13, left message for pt to return call to see if this refill is necessary

## 2013-09-05 NOTE — Telephone Encounter (Signed)
Pt states that she never received the Xanax Rx at her visit on 09/01/13

## 2013-10-07 ENCOUNTER — Other Ambulatory Visit: Payer: Self-pay | Admitting: Internal Medicine

## 2013-10-07 NOTE — Telephone Encounter (Signed)
Rx phoned to pharmacy.  

## 2013-10-07 NOTE — Telephone Encounter (Signed)
ok'd #20 xanax with no refills.

## 2013-10-07 NOTE — Telephone Encounter (Signed)
Last visit 09/01/13, last refill 09/05/13 #20

## 2013-10-17 ENCOUNTER — Other Ambulatory Visit: Payer: Self-pay | Admitting: Internal Medicine

## 2013-11-11 ENCOUNTER — Other Ambulatory Visit: Payer: Self-pay | Admitting: Internal Medicine

## 2013-11-11 NOTE — Telephone Encounter (Signed)
Refilled xanax #20 with no refills.   

## 2013-11-29 ENCOUNTER — Other Ambulatory Visit: Payer: Managed Care, Other (non HMO)

## 2013-12-02 ENCOUNTER — Ambulatory Visit: Payer: Managed Care, Other (non HMO) | Admitting: Internal Medicine

## 2013-12-07 ENCOUNTER — Other Ambulatory Visit: Payer: Self-pay | Admitting: Internal Medicine

## 2013-12-07 NOTE — Telephone Encounter (Signed)
Refill

## 2013-12-07 NOTE — Telephone Encounter (Signed)
Refilled xanax #20 with no refills.   

## 2013-12-07 NOTE — Telephone Encounter (Signed)
Rx faxed to pharmacy  

## 2013-12-12 ENCOUNTER — Other Ambulatory Visit (INDEPENDENT_AMBULATORY_CARE_PROVIDER_SITE_OTHER): Payer: Managed Care, Other (non HMO)

## 2013-12-12 ENCOUNTER — Encounter: Payer: Self-pay | Admitting: Internal Medicine

## 2013-12-12 DIAGNOSIS — E7800 Pure hypercholesterolemia, unspecified: Secondary | ICD-10-CM

## 2013-12-12 DIAGNOSIS — I10 Essential (primary) hypertension: Secondary | ICD-10-CM

## 2013-12-12 LAB — HEPATIC FUNCTION PANEL
ALBUMIN: 4.4 g/dL (ref 3.5–5.2)
ALT: 18 U/L (ref 0–35)
AST: 16 U/L (ref 0–37)
Alkaline Phosphatase: 33 U/L — ABNORMAL LOW (ref 39–117)
Bilirubin, Direct: 0 mg/dL (ref 0.0–0.3)
TOTAL PROTEIN: 7.6 g/dL (ref 6.0–8.3)
Total Bilirubin: 0.6 mg/dL (ref 0.3–1.2)

## 2013-12-12 LAB — BASIC METABOLIC PANEL
BUN: 14 mg/dL (ref 6–23)
CHLORIDE: 104 meq/L (ref 96–112)
CO2: 26 mEq/L (ref 19–32)
Calcium: 9.7 mg/dL (ref 8.4–10.5)
Creatinine, Ser: 0.6 mg/dL (ref 0.4–1.2)
GFR: 106.31 mL/min (ref >60.00)
Glucose, Bld: 94 mg/dL (ref 70–99)
Potassium: 4.7 mEq/L (ref 3.5–5.1)
SODIUM: 138 meq/L (ref 135–145)

## 2013-12-12 LAB — LIPID PANEL
CHOL/HDL RATIO: 5
Cholesterol: 238 mg/dL — ABNORMAL HIGH (ref 0–200)
HDL: 47.2 mg/dL (ref >39.00)
LDL CALC: 103 mg/dL — AB (ref 0–99)
TRIGLYCERIDES: 438 mg/dL — AB (ref 0.0–149.0)
VLDL: 87.6 mg/dL — ABNORMAL HIGH (ref 0.0–40.0)

## 2013-12-13 ENCOUNTER — Encounter: Payer: Self-pay | Admitting: *Deleted

## 2013-12-14 NOTE — Telephone Encounter (Signed)
Mailed unread message to pt  

## 2013-12-23 ENCOUNTER — Encounter: Payer: Self-pay | Admitting: Internal Medicine

## 2013-12-23 ENCOUNTER — Ambulatory Visit (INDEPENDENT_AMBULATORY_CARE_PROVIDER_SITE_OTHER): Payer: Managed Care, Other (non HMO) | Admitting: Internal Medicine

## 2013-12-23 VITALS — BP 112/80 | HR 78 | Temp 98.2°F | Ht 67.75 in | Wt 265.5 lb

## 2013-12-23 DIAGNOSIS — K5790 Diverticulosis of intestine, part unspecified, without perforation or abscess without bleeding: Secondary | ICD-10-CM

## 2013-12-23 DIAGNOSIS — F411 Generalized anxiety disorder: Secondary | ICD-10-CM

## 2013-12-23 DIAGNOSIS — F419 Anxiety disorder, unspecified: Secondary | ICD-10-CM

## 2013-12-23 DIAGNOSIS — K5730 Diverticulosis of large intestine without perforation or abscess without bleeding: Secondary | ICD-10-CM

## 2013-12-23 DIAGNOSIS — E7800 Pure hypercholesterolemia, unspecified: Secondary | ICD-10-CM

## 2013-12-23 DIAGNOSIS — K219 Gastro-esophageal reflux disease without esophagitis: Secondary | ICD-10-CM

## 2013-12-23 DIAGNOSIS — I10 Essential (primary) hypertension: Secondary | ICD-10-CM

## 2013-12-23 NOTE — Progress Notes (Signed)
Pre-visit discussion using our clinic review tool. No additional management support is needed unless otherwise documented below in the visit note.  

## 2013-12-23 NOTE — Progress Notes (Signed)
Subjective:    [name], female    DOB: 21-Nov-1960, 53 y.o.   MRN: [id number]  HPI 53 year old female with past history of GERD, hypertension and hypercholesterolemia who comes in today for a scheduled follow up.  She states she has been doing relatively well.  Handling stress relatively well.   No increased cough or congestion.  Did not report any sob today.  No chest pain or tightness.  Recent stress test negative.   No acid reflux.  Bowels stable.  No depression.  Overall feels she is doing relatively well.  She did fall on the ice.  Fell forward.  Hurt her left knee.  Is much better.  Did not hit her head.  On omeprazole 20mg  q day.  Controlling her acid reflux.     Past Medical History  Diagnosis Date  . Hypercholesterolemia   . Diverticulosis   . Anemia   . Hypertension   . Depression   . Allergy   . History of chickenpox   . Osteoarthritis   . FHx: migraine headaches     Outpatient Encounter Prescriptions as of 12/23/2013  Medication Sig  . acetaminophen (TYLENOL) 650 MG CR tablet Take 650 mg by mouth every 8 (eight) hours as needed for pain.  Marland Kitchen ALPRAZolam (XANAX) 0.25 MG tablet TAKE 1 TABLET BY MOUTH TWICE DAILY AS NEEDED FOR SLEEP  . amLODipine (NORVASC) 5 MG tablet TAKE 1 TABLET BY MOUTH TWICE DAILY  . atorvastatin (LIPITOR) 20 MG tablet Take 1 tablet (20 mg total) by mouth daily.  . citalopram (CELEXA) 40 MG tablet TAKE ONE TABLET BY MOUTH EVERY DAY  . Lactase (LACTAID FAST ACT) 9000 UNITS TABS Take by mouth.  Marland Kitchen lisinopril (PRINIVIL,ZESTRIL) 40 MG tablet TAKE 1 TABLET BY MOUTH EVERY DAY  . metoprolol succinate (TOPROL-XL) 50 MG 24 hr tablet TAKE 1 TABLET BY MOUTH TWICE DAILY  . omeprazole (PRILOSEC) 20 MG capsule Take 20 mg by mouth daily.  Marland Kitchen spironolactone (ALDACTONE) 50 MG tablet TAKE 1 TABLET BY MOUTH EVERY DAY  . [DISCONTINUED] pantoprazole (PROTONIX) 40 MG tablet Take 1 tablet (40 mg total) by mouth daily.  . [DISCONTINUED] ranitidine (ZANTAC) 150 MG  tablet Take 150 mg by mouth at bedtime.    Review of Systems Patient denies any headache, lightheadedness or dizziness.  No sinus or allergy symptoms.  No cough or congestion.  Breathing stable.  No chest pain or pressure reported.   No nausea or vomiting.  No acid reflux.  No abdominal pain or cramping.  No bowel change, such as diarrhea, constipation, BRBPR or melana.  No urine change.  No depression.  Knee better s/p fall.          Objective:   Physical Exam  Filed Vitals:   12/23/13 1111  BP: 112/80  Pulse: 78  Temp: 98.2 F (36.8 C)   Blood pressure recheck:  55/63  53 year old female in no acute distress.   HEENT:  Nares- clear.  Oropharynx - without lesions. NECK:  Supple.  Nontender.  No audible bruit.  HEART:  Appears to be regular. LUNGS:  No crackles or wheezing audible.  Respirations even and unlabored.  RADIAL PULSE:  Equal bilaterally.  ABDOMEN:  Soft, nontender.  Bowel sounds present and normal.  No audible abdominal bruit.    EXTREMITIES:  No increased edema present.  DP pulses palpable and equal bilaterally.          Assessment & Plan:  CARDIOVASCULAR.  No  chest pain or tightness reported today.  Recent stress test negative.      PULMONARY.  Breathing ok.  No sob reported today.      MSK.  Saw Dr Rudene Christians. S/p injection of her shoulder.  Did not help.  Saw Dr Sharlet Salina.  Repeat injection.  Pain resolved.    HEALTH MAINTENANCE.  Physical 06/28/13.   Colonoscopy 09/12/08 - internal hemorrhoids and diverticulosis.  Recommended f/u colonoscopy 2019.  Had mammogram 02/01/13 - Birads I.

## 2013-12-25 ENCOUNTER — Encounter: Payer: Self-pay | Admitting: Internal Medicine

## 2013-12-25 DIAGNOSIS — K219 Gastro-esophageal reflux disease without esophagitis: Secondary | ICD-10-CM | POA: Insufficient documentation

## 2013-12-25 NOTE — Assessment & Plan Note (Signed)
Controlled on omeprazole.   

## 2013-12-25 NOTE — Assessment & Plan Note (Signed)
On citalopram and has done well on this medication.  She has xanax .25mg  to have if needed.  Works well for her.  Rarely takes.  Follow.

## 2013-12-25 NOTE — Assessment & Plan Note (Signed)
Remains on Vytorin.  Low cholesterol diet and exercise.  Follow lipid panel and liver function.

## 2013-12-25 NOTE — Assessment & Plan Note (Signed)
Blood pressure better.  Follow metabolic panel.

## 2013-12-25 NOTE — Assessment & Plan Note (Signed)
Colonoscopy 12/09 - internal hemorrhoids and diverticulosis.  Currently asymptomatic.  Recommended f/u colonoscopy 2019.

## 2014-01-03 ENCOUNTER — Other Ambulatory Visit: Payer: Self-pay | Admitting: Internal Medicine

## 2014-01-04 NOTE — Telephone Encounter (Signed)
Refilled xanax #30 with no refills.   

## 2014-01-04 NOTE — Telephone Encounter (Signed)
Ok to fill 

## 2014-01-09 ENCOUNTER — Telehealth: Payer: Self-pay | Admitting: Internal Medicine

## 2014-01-09 ENCOUNTER — Ambulatory Visit (INDEPENDENT_AMBULATORY_CARE_PROVIDER_SITE_OTHER)
Admission: RE | Admit: 2014-01-09 | Discharge: 2014-01-09 | Disposition: A | Discharge status: Home / Self Care | Payer: Managed Care, Other (non HMO) | Source: Ambulatory Visit | Attending: Internal Medicine | Admitting: Internal Medicine

## 2014-01-09 ENCOUNTER — Ambulatory Visit (INDEPENDENT_AMBULATORY_CARE_PROVIDER_SITE_OTHER): Payer: Managed Care, Other (non HMO) | Admitting: Internal Medicine

## 2014-01-09 ENCOUNTER — Other Ambulatory Visit: Payer: Self-pay | Admitting: Internal Medicine

## 2014-01-09 ENCOUNTER — Encounter: Payer: Self-pay | Admitting: Internal Medicine

## 2014-01-09 VITALS — BP 162/104 | HR 93 | Temp 98.3°F | Resp 15 | Wt 269.0 lb

## 2014-01-09 DIAGNOSIS — F419 Anxiety disorder, unspecified: Secondary | ICD-10-CM

## 2014-01-09 DIAGNOSIS — M25551 Pain in right hip: Secondary | ICD-10-CM

## 2014-01-09 DIAGNOSIS — F411 Generalized anxiety disorder: Secondary | ICD-10-CM

## 2014-01-09 DIAGNOSIS — I10 Essential (primary) hypertension: Secondary | ICD-10-CM

## 2014-01-09 DIAGNOSIS — M25559 Pain in unspecified hip: Secondary | ICD-10-CM

## 2014-01-09 MED ORDER — TRAMADOL HCL 50 MG PO TABS: 50.0000 mg | ORAL_TABLET | Freq: Three times a day (TID) | ORAL | Status: DC | PRN

## 2014-01-09 NOTE — Telephone Encounter (Signed)
Pt called to schedule acute appt with Dr. Nicki Reaper for R hip pain.  Pt scheduled with Dr. Gilford Rile.  Pt wants Dr. Nicki Reaper to be aware that she is seeing Dr. Gilford Rile.  Advised pt Dr. Nicki Reaper would have access to all records of today's visit.

## 2014-01-09 NOTE — Telephone Encounter (Signed)
Dr. Gilford Rile discussed with Dr. Nicki Reaper

## 2014-01-09 NOTE — Progress Notes (Signed)
Pre visit review using our clinic review tool, if applicable. No additional management support is needed unless otherwise documented below in the visit note. 

## 2014-01-09 NOTE — Assessment & Plan Note (Signed)
Sudden onset right lateral hip pain. Exam remarkable for tenderness right lateral hip but ROM and strength intact. Given recent falls, will check plain xray for evaluation. Will start Tramadol prn pain. Note pt unable to tolerate NSAIDS because of h/o worsening GERD with these meds. Follow up after xray and in 1 week.

## 2014-01-09 NOTE — Assessment & Plan Note (Signed)
BP Readings from Last 3 Encounters:  01/09/14 162/104  12/23/13 112/80  09/01/13 118/78   BP elevated today, however pt has not taken BP meds and is very anxious on exam. Previously BP well controlled. Encouraged her to take BP meds as directed today. Will recheck BP next week in visit.

## 2014-01-09 NOTE — Assessment & Plan Note (Addendum)
Significant recent increase in anxiety with ongoing family issues. Encouraged her to consider counseling. She declines for now. Continue Citalopram and prn Alprazolam. Follow up 1 week.

## 2014-01-09 NOTE — Progress Notes (Signed)
Subjective:    [name], female    DOB: 10/12/1961, 53 y.o.   MRN: [id number]  HPI 53YO female presents for acute visit.  Fell on ice a few weeks ago. No injury noted.  Yesterday developed severe right hip pain. Sharp intermittent pain lateral hip. Occurs both at rest and with ambulation. No weakness or numbness. Taking occasional Tylenol with no improvement.  Notes significant increase in anxiety this week. Uncle passed away unexpectedly last week. Also very anxious about her mother's wellbeing. [place] out of state in [place]. Having some trouble with anxious mood and episodes of panic. Using alprazolam prn with slight improvement. Has had counseling in the past, not interested at present.  Also notes she did not have time to take BP meds this morning. Otherwise, has been compliant with medications. No chest pain, headache, palpitations.  Review of Systems  Constitutional: Negative for fever, chills, appetite change, fatigue and unexpected weight change.  HENT: Negative for congestion, ear pain, sinus pressure, sore throat, trouble swallowing and voice change.   Eyes: Negative for visual disturbance.  Respiratory: Negative for cough, shortness of breath, wheezing and stridor.   Cardiovascular: Negative for chest pain, palpitations and leg swelling.  Gastrointestinal: Negative for nausea, vomiting, abdominal pain, diarrhea, constipation, blood in stool, abdominal distention and anal bleeding.  Genitourinary: Negative for dysuria and flank pain.  Musculoskeletal: Positive for arthralgias (righit hip) and myalgias. Negative for gait problem and neck pain.  Skin: Negative for color change and rash.  Neurological: Negative for dizziness and headaches.  Hematological: Negative for adenopathy. Does not bruise/bleed easily.  Psychiatric/Behavioral: Positive for sleep disturbance and agitation. Negative for suicidal ideas and dysphoric mood. The patient is nervous/anxious.         Objective:    BP 162/104  Pulse 93  Temp(Src) 98.3 F (36.8 C) (Oral)  Resp 15  Wt 269 lb (122.018 kg)  SpO2 96% Physical Exam  Constitutional: She is oriented to person, place, and time. She appears well-developed and well-nourished. No distress.  HENT:  Head: Normocephalic and atraumatic.  Right Ear: External ear normal.  Left Ear: External ear normal.  Nose: Nose normal.  Mouth/Throat: Oropharynx is clear and moist. No oropharyngeal exudate.  Eyes: Conjunctivae are normal. Pupils are equal, round, and reactive to light. Right eye exhibits no discharge. Left eye exhibits no discharge. No scleral icterus.  Neck: Normal range of motion. Neck supple. No tracheal deviation present. No thyromegaly present.  Pulmonary/Chest: Effort normal. No accessory muscle usage. Not tachypneic. She has no decreased breath sounds. She has no rhonchi.  Musculoskeletal: Normal range of motion. She exhibits no edema.       Right hip: She exhibits tenderness (right lateral hip) and bony tenderness (right lateral hip). She exhibits normal range of motion, normal strength, no swelling and no deformity.  Lymphadenopathy:    She has no cervical adenopathy.  Neurological: She is alert and oriented to person, place, and time. No cranial nerve deficit. She exhibits normal muscle tone. Coordination normal.  Skin: Skin is warm and dry. No rash noted. She is not diaphoretic. No erythema. No pallor.  Psychiatric: Judgment and thought content normal. Her mood appears anxious. Her speech is rapid and/or pressured. She is agitated. Cognition and memory are normal.          Assessment & Plan:   Problem List Items Addressed This Visit   Anxiety     Significant recent increase in anxiety with ongoing family issues. Encouraged her  to consider counseling. She declines for now. Continue Citalopram and prn Alprazolam. Follow up 1 week.    Essential hypertension, benign      BP Readings from Last 3  Encounters:  01/09/14 162/104  12/23/13 112/80  09/01/13 118/78   BP elevated today, however pt has not taken BP meds and is very anxious on exam. Previously BP well controlled. Encouraged her to take BP meds as directed today. Will recheck BP next week in visit.    Right hip pain - Primary     Sudden onset right lateral hip pain. Exam remarkable for tenderness right lateral hip but ROM and strength intact. Given recent falls, will check plain xray for evaluation. Will start Tramadol prn pain. Note pt unable to tolerate NSAIDS because of h/o worsening GERD with these meds. Follow up after xray and in 1 week.    Relevant Medications      traMADol (ULTRAM) tablet 50 mg   Other Relevant Orders      DG Hip Complete Right       Return in about 1 week (around 01/16/2014) for Recheck.

## 2014-01-16 ENCOUNTER — Encounter: Payer: Self-pay | Admitting: Adult Health

## 2014-01-16 ENCOUNTER — Ambulatory Visit (INDEPENDENT_AMBULATORY_CARE_PROVIDER_SITE_OTHER): Payer: Managed Care, Other (non HMO) | Admitting: Adult Health

## 2014-01-16 VITALS — BP 168/98 | HR 112 | Temp 98.3°F | Wt 266.0 lb

## 2014-01-16 DIAGNOSIS — M79646 Pain in unspecified finger(s): Secondary | ICD-10-CM

## 2014-01-16 DIAGNOSIS — M79609 Pain in unspecified limb: Secondary | ICD-10-CM

## 2014-01-16 LAB — CBC WITH DIFFERENTIAL/PLATELET
Basophils Absolute: 0 10*3/uL (ref 0.0–0.1)
Basophils Relative: 0.5 % (ref 0.0–3.0)
EOS PCT: 1.2 % (ref 0.0–5.0)
Eosinophils Absolute: 0.1 10*3/uL (ref 0.0–0.7)
HCT: 42.7 % (ref 36.0–46.0)
HEMOGLOBIN: 14.7 g/dL (ref 12.0–15.0)
LYMPHS PCT: 29.1 % (ref 12.0–46.0)
Lymphs Abs: 2.5 10*3/uL (ref 0.7–4.0)
MCHC: 34.4 g/dL (ref 30.0–36.0)
MCV: 99.5 fl (ref 78.0–100.0)
MONOS PCT: 8.3 % (ref 3.0–12.0)
Monocytes Absolute: 0.7 10*3/uL (ref 0.1–1.0)
NEUTROS ABS: 5.2 10*3/uL (ref 1.4–7.7)
Neutrophils Relative %: 60.9 % (ref 43.0–77.0)
Platelets: 233 10*3/uL (ref 150.0–400.0)
RBC: 4.3 Mil/uL (ref 3.87–5.11)
RDW: 13.9 % (ref 11.5–14.6)
WBC: 8.6 10*3/uL (ref 4.5–10.5)

## 2014-01-16 LAB — SEDIMENTATION RATE: Sed Rate: 0 mm/hr (ref 0–22)

## 2014-01-16 LAB — URIC ACID: URIC ACID, SERUM: 5.4 mg/dL (ref 2.4–7.0)

## 2014-01-16 NOTE — Progress Notes (Signed)
Pre visit review using our clinic review tool, if applicable. No additional management support is needed unless otherwise documented below in the visit note. 

## 2014-01-16 NOTE — Progress Notes (Signed)
   Subjective:    [name], female    DOB: 1961/02/27, 53 y.o.   MRN: [id number]  HPI  Patient is a pleasant 53 year old female who presents to clinic with a swollen index finger. Symptoms started on Thursday. She reports that the tip of her finger was very painful Saturday and Sunday. Symptoms started to subside yesterday. Denies history of gout. Patient does have history of arthritis. She has been soaking her finger in salt water solution and applying Neosporin. Denies any systemic symptoms.  Past Medical History  Diagnosis Date  . Hypercholesterolemia   . Diverticulosis   . Anemia   . Hypertension   . Depression   . Allergy   . History of chickenpox   . Osteoarthritis   . FHx: migraine headaches      Review of Systems  Constitutional: Negative.   Musculoskeletal: Positive for arthralgias (pain right index finger) and joint swelling.  All other systems reviewed and are negative.       Objective:   Physical Exam  Constitutional: She is oriented to person, place, and time. No distress.  Cardiovascular: Normal rate and regular rhythm.   Pulmonary/Chest: Effort normal. No respiratory distress.  Musculoskeletal: Normal range of motion. She exhibits edema and tenderness.  Right index finger DIP tender, erythematous. Erythema noted around cuticle area.  Neurological: She is alert and oriented to person, place, and time.  Skin: There is erythema.  Psychiatric: She has a normal mood and affect. Her behavior is normal. Judgment and thought content normal.   BP 168/98  Pulse 112  Temp(Src) 98.3 F (36.8 C) (Oral)  Wt 266 lb (120.657 kg)  SpO2 94%      Assessment & Plan:   1. Pain in finger Right index finger. Appears to be localized infection around cuticle. However, may also be gout. Will check labs. Start ibuprofen. Continue to soak for 10-15 min 2-3 times a day. Apply neosporin. If labs indicative of gout will contact pt with further instructions.  -  Uric acid - CBC with Differential - Sedimentation rate

## 2014-01-16 NOTE — Patient Instructions (Signed)
  Your symptoms appear to be related to a localized infection at the cuticle.  Continue to soak the finger in Epson salt 2-3 times a day. Do this for 10-15 minutes.  Apply Neosporin around the cuticle.  Take ibuprofen 1-2 tablets every 6 hours for the next 2-3 days.  I am checking labs for gout. Should the uric acid levels be elevated I will contact you with further instructions. I will do this through your mychart at count

## 2014-01-27 ENCOUNTER — Ambulatory Visit: Payer: Managed Care, Other (non HMO) | Admitting: Internal Medicine

## 2014-01-30 ENCOUNTER — Other Ambulatory Visit: Payer: Self-pay | Admitting: Internal Medicine

## 2014-01-30 NOTE — Telephone Encounter (Signed)
Is she taking xanax every day now.  She previously just took this prn.  This was just filled on 01/05/14.  Just need a little more information.

## 2014-01-30 NOTE — Telephone Encounter (Signed)
Electronic Rx request, please advise. 

## 2014-02-01 NOTE — Telephone Encounter (Signed)
Pt states that does not take it daily. She just picked up her Celexa yesterday that she takes daily. She states that she does not take the Xanax daily on during storms and etc. She has plenty left. I notified her that I would cancel the refill request.

## 2014-02-27 ENCOUNTER — Other Ambulatory Visit: Payer: Self-pay | Admitting: Internal Medicine

## 2014-02-27 NOTE — Telephone Encounter (Signed)
Refill

## 2014-02-27 NOTE — Telephone Encounter (Signed)
Rx called to pharmacy

## 2014-02-27 NOTE — Telephone Encounter (Signed)
Refilled alprazolam #30 with no refills.   

## 2014-03-27 ENCOUNTER — Other Ambulatory Visit: Payer: Self-pay | Admitting: Internal Medicine

## 2014-04-19 ENCOUNTER — Other Ambulatory Visit: Payer: Managed Care, Other (non HMO)

## 2014-04-24 ENCOUNTER — Ambulatory Visit: Payer: Managed Care, Other (non HMO) | Admitting: Internal Medicine

## 2014-04-28 ENCOUNTER — Encounter: Payer: Self-pay | Admitting: Internal Medicine

## 2014-06-19 ENCOUNTER — Other Ambulatory Visit: Payer: Self-pay | Admitting: Internal Medicine

## 2014-06-20 ENCOUNTER — Other Ambulatory Visit: Payer: Managed Care, Other (non HMO)

## 2014-06-20 NOTE — Telephone Encounter (Signed)
Will need physical scheduled prior to next refill.

## 2014-06-20 NOTE — Telephone Encounter (Signed)
Needs a physical scheduled.  Ok to refill #30 with one refill.  Make sure gets physical scheduled.

## 2014-06-20 NOTE — Telephone Encounter (Signed)
Pt refused to schedule appointment at this time.  States she would call back.

## 2014-06-20 NOTE — Telephone Encounter (Signed)
Last refill 8.11.15, last OV 7.13.15.  Please advise refill

## 2014-06-23 ENCOUNTER — Ambulatory Visit: Payer: Managed Care, Other (non HMO) | Admitting: Internal Medicine

## 2014-07-26 ENCOUNTER — Telehealth: Payer: Self-pay | Admitting: Internal Medicine

## 2014-07-26 NOTE — Telephone Encounter (Addendum)
Heather Blake dropped off paperwork to be filled out (Health Screening Results). I placed the paperwork in the Physician box. Call her if needed. Thank you.

## 2014-07-27 DIAGNOSIS — Z7689 Persons encountering health services in other specified circumstances: Secondary | ICD-10-CM

## 2014-07-27 NOTE — Telephone Encounter (Signed)
Form completed.  In your basket.  She will need to sign her part before information can be sent or released.  Also, she is overdue a f/u appt with me.  Need to schedule.

## 2014-07-27 NOTE — Telephone Encounter (Signed)
Form placed in red folder  

## 2014-07-28 NOTE — Telephone Encounter (Signed)
Pt has signed her portion, form faxed, & copy mailed to patient

## 2014-10-19 ENCOUNTER — Encounter: Payer: Self-pay | Admitting: *Deleted

## 2014-10-19 NOTE — Telephone Encounter (Signed)
From: Myrtie Cruise Sent: 07/24/2014 12:10 PM EDT To: Aviva Kluver Subject: RE:Flu Shot Clinic Saturday, 07/29/14 [place]  Thank you for the offer of a Flu Shot but I already had a Flu Shot on June 29, 2014 at [facility] in [place], [place].  ----- Message ----- From: Genia Plants Sent: 07/24/2014 8:44 AM EDT To: Myrtie Cruise Subject: Flu Shot Clinic Saturday, 07/29/14 [facility] will hold a [employer] on Saturday, October 17 from 9 am to noon. In order to plan appropriately, we ask you to please call the office to schedule an appointment time during the clinic. Our schedulers are ready to assist you, Monday through Friday, 8a to 5p at [phone]. Thank you for choosing [facility] for your healthcare needs.

## 2015-06-07 ENCOUNTER — Telehealth: Payer: Self-pay | Admitting: Internal Medicine

## 2015-06-07 NOTE — Telephone Encounter (Signed)
Pt wants an appoint just for a medication update only. Will Dr Nicki Reaper see pt just to update medication? Pt does not want a CPE. Let me know. Thank You!

## 2015-06-07 NOTE — Telephone Encounter (Signed)
Sent pt a my chart message (per her request) about scheduling an appt.  Ok to schedule med recheck appt.

## 2015-06-07 NOTE — Telephone Encounter (Signed)
I returned pts called by calling the home phone number listed.  Pt became upset that I called home phone number, stated she was going to change providers because I called the home phone.  I attempted to advise pt that since her last OV was 01/2014 she would need a CPE instead of just a medication f/u.  Pt states further stated that she does not want to speak with me that she usually corresponds only with Dr Nicki Reaper via [facility].  I attempted to advise pt that she was speaking with me because she left a voice message and not a MYCHART message.  Pt stated she DOES NOT want a CPE she only wants a medication f/u and if she can not have that she would stop taking all medications while on vacation and change providers once she returns.  Then she hung up on me.

## 2015-06-07 NOTE — Telephone Encounter (Signed)
Sent pt my chart message about scheduling an appt.  I do not mind seeing her for med f/u.

## 2015-06-11 ENCOUNTER — Ambulatory Visit (INDEPENDENT_AMBULATORY_CARE_PROVIDER_SITE_OTHER): Payer: Managed Care, Other (non HMO) | Admitting: Nurse Practitioner

## 2015-06-11 VITALS — BP 132/84 | HR 63 | Temp 98.4°F | Resp 16 | Ht 67.75 in | Wt 241.0 lb

## 2015-06-11 DIAGNOSIS — R221 Localized swelling, mass and lump, neck: Secondary | ICD-10-CM

## 2015-06-11 DIAGNOSIS — I10 Essential (primary) hypertension: Secondary | ICD-10-CM

## 2015-06-11 DIAGNOSIS — E7800 Pure hypercholesterolemia, unspecified: Secondary | ICD-10-CM

## 2015-06-11 DIAGNOSIS — F419 Anxiety disorder, unspecified: Secondary | ICD-10-CM

## 2015-06-11 MED ORDER — AMLODIPINE BESYLATE 5 MG PO TABS: 5.0000 mg | ORAL_TABLET | Freq: Two times a day (BID) | ORAL | Status: DC

## 2015-06-11 MED ORDER — CITALOPRAM HYDROBROMIDE 40 MG PO TABS: 40.0000 mg | ORAL_TABLET | Freq: Every day | ORAL | Status: DC

## 2015-06-11 MED ORDER — METOPROLOL SUCCINATE ER 50 MG PO TB24
50.0000 mg | ORAL_TABLET | Freq: Two times a day (BID) | ORAL | Status: DC
Start: 2015-06-11 — End: 2015-12-06

## 2015-06-11 MED ORDER — ALPRAZOLAM 0.25 MG PO TABS: ORAL_TABLET | ORAL | Status: DC

## 2015-06-11 MED ORDER — SPIRONOLACTONE 50 MG PO TABS: 50.0000 mg | ORAL_TABLET | Freq: Every day | ORAL | Status: DC

## 2015-06-11 MED ORDER — ATORVASTATIN CALCIUM 20 MG PO TABS: 20.0000 mg | ORAL_TABLET | Freq: Every day | ORAL | Status: DC

## 2015-06-11 MED ORDER — LISINOPRIL 40 MG PO TABS: 40.0000 mg | ORAL_TABLET | Freq: Every day | ORAL | Status: DC

## 2015-06-11 NOTE — Progress Notes (Signed)
Pre visit review using our clinic review tool, if applicable. No additional management support is needed unless otherwise documented below in the visit note. 

## 2015-06-11 NOTE — Progress Notes (Signed)
[name], female    DOB: June 29, 1961  Age: 54 y.o. MRN: [id number]  CC: Medication Refill   HPI Heather Blake presents for medication refills.   1) Xanax and Celexa- stable currently on these would like to continue  2) Norvasc, Lisinopril, Metoprolol-  Stable on these would like to continue requesting 90 day supply  3) Lipitor- stable on this medication would like to continue 90 day supply  5) Spironolactone- stable on spironolactone will continue 90 day supply  6) Lump on neck 1.5 inches approx. Pt did not notice until I asked about this. She does not report trouble swallowing   History Heather Blake has a past medical history of Hypercholesterolemia; Diverticulosis; Anemia; Hypertension; Depression; Allergy; History of chickenpox; Osteoarthritis; and FHx: migraine headaches.   She has past surgical history that includes Tubal ligation (1993); Miscarriage (1987); Child Birth [phone] and 20); Tonsillectomy (1963); Laparoscopic supracervical hysterectomy (2006); left knee; and left elbow.   Her family history includes Arthritis in her mother; Diabetes in her mother; Heart disease in her mother and sister; Heart disease (age of onset: 89) in her father; Hypertension in her mother; Migraines in her mother and sister; Nephrolithiasis in her mother.She reports that she has quit smoking. She has never used smokeless tobacco. She reports that she drinks alcohol. She reports that she does not use illicit drugs.  Outpatient Prescriptions Prior to Visit  Medication Sig Dispense Refill  . acetaminophen (TYLENOL) 650 MG CR tablet Take 650 mg by mouth every 8 (eight) hours as needed for pain.    . Lactase (LACTAID FAST ACT) 9000 UNITS TABS Take by mouth.    Marland Kitchen omeprazole (PRILOSEC) 20 MG capsule Take 20 mg by mouth daily.    Marland Kitchen ALPRAZolam (XANAX) 0.25 MG tablet TAKE ONE TABLET BY MOUTH DAILY AS NEEDED FOR ANXIETY 30 tablet 0  . amLODipine (NORVASC) 5 MG tablet TAKE 1 TABLET BY MOUTH TWICE  DAILY 60 tablet 5  . atorvastatin (LIPITOR) 20 MG tablet Take 1 tablet (20 mg total) by mouth daily. 30 tablet 5  . citalopram (CELEXA) 40 MG tablet TAKE 1 TABLET BY MOUTH EVERY DAY 30 tablet 0  . lisinopril (PRINIVIL,ZESTRIL) 40 MG tablet TAKE 1 TABLET BY MOUTH EVERY DAY 30 tablet 5  . metoprolol succinate (TOPROL-XL) 50 MG 24 hr tablet TAKE 1 TABLET BY MOUTH TWICE DAILY 60 tablet 5  . spironolactone (ALDACTONE) 50 MG tablet TAKE 1 TABLET BY MOUTH EVERY DAY 30 tablet 5  . traMADol (ULTRAM) 50 MG tablet Take 1 tablet (50 mg total) by mouth every 8 (eight) hours as needed. (Patient not taking: Reported on 06/11/2015) 30 tablet 0   No facility-administered medications prior to visit.    ROS Review of Systems  Constitutional: Negative for fever, chills, diaphoresis and fatigue.  HENT: Negative for tinnitus and trouble swallowing.   Respiratory: Negative for chest tightness, shortness of breath and wheezing.   Cardiovascular: Negative for chest pain, palpitations and leg swelling.  Gastrointestinal: Negative for nausea, vomiting and diarrhea.  Skin: Negative for rash.  Neurological: Negative for dizziness, weakness, numbness and headaches.  Psychiatric/Behavioral: The patient is not nervous/anxious.    Objective:  BP 132/84 mmHg  Pulse 63  Temp(Src) 98.4 F (36.9 C)  Resp 16  Ht 5' 7.75" (1.721 m)  Wt 241 lb (109.317 kg)  BMI 36.91 kg/m2  SpO2 97%  Physical Exam  Constitutional: She is oriented to person, place, and time. She appears well-developed and well-nourished. No distress.  HENT:  Head: Normocephalic and atraumatic.  Right Ear: External ear normal.  Left Ear: External ear normal.  Neck: Normal range of motion. No thyromegaly present.  1.5 inch mobile lump on anterior left side of neck. Non-tender, able to illuminate   Cardiovascular: Normal rate.   Pulmonary/Chest: Effort normal.  Lymphadenopathy:    She has no cervical adenopathy.  Neurological: She is alert and  oriented to person, place, and time. No cranial nerve deficit. She exhibits normal muscle tone. Coordination normal.  Skin: Skin is warm and dry. No rash noted. She is not diaphoretic.  Psychiatric: She has a normal mood and affect. Her behavior is normal. Judgment and thought content normal.    Assessment & Plan:   Heather Blake was seen today for medication refill.  Diagnoses and all orders for this visit:  Essential hypertension, benign -     Cancel: Comprehensive metabolic panel -     Cancel: CBC with Differential/Platelet -     CBC with Differential/Platelet; Future -     Comprehensive metabolic panel; Future  Anxiety  Hypercholesterolemia -     Cancel: Lipid panel -     Lipid panel; Future  Localized swelling, mass or lump of neck  Other orders -     ALPRAZolam (XANAX) 0.25 MG tablet; TAKE ONE TABLET BY MOUTH DAILY AS NEEDED FOR ANXIETY -     amLODipine (NORVASC) 5 MG tablet; Take 1 tablet (5 mg total) by mouth 2 (two) times daily. -     atorvastatin (LIPITOR) 20 MG tablet; Take 1 tablet (20 mg total) by mouth daily. -     citalopram (CELEXA) 40 MG tablet; Take 1 tablet (40 mg total) by mouth daily. -     lisinopril (PRINIVIL,ZESTRIL) 40 MG tablet; Take 1 tablet (40 mg total) by mouth daily. -     metoprolol succinate (TOPROL-XL) 50 MG 24 hr tablet; Take 1 tablet (50 mg total) by mouth 2 (two) times daily. Take with or immediately following a meal. -     spironolactone (ALDACTONE) 50 MG tablet; Take 1 tablet (50 mg total) by mouth daily.   I have discontinued Heather Blake's traMADol. I have also changed her amLODipine, citalopram, lisinopril, metoprolol succinate, and spironolactone. Additionally, I am having her maintain her acetaminophen, Lactase, omeprazole, ALPRAZolam, and atorvastatin.  Meds ordered this encounter  Medications  . ALPRAZolam (XANAX) 0.25 MG tablet    Sig: TAKE ONE TABLET BY MOUTH DAILY AS NEEDED FOR ANXIETY    Dispense:  30 tablet    Refill:  0    Order  Specific Question:  Supervising Provider    Answer:  Deborra Medina L [[id number]]  . amLODipine (NORVASC) 5 MG tablet    Sig: Take 1 tablet (5 mg total) by mouth 2 (two) times daily.    Dispense:  180 tablet    Refill:  1    Order Specific Question:  Supervising Provider    Answer:  Deborra Medina L [[id number]]  . atorvastatin (LIPITOR) 20 MG tablet    Sig: Take 1 tablet (20 mg total) by mouth daily.    Dispense:  90 tablet    Refill:  1    Order Specific Question:  Supervising Provider    Answer:  Deborra Medina L [[id number]]  . citalopram (CELEXA) 40 MG tablet    Sig: Take 1 tablet (40 mg total) by mouth daily.    Dispense:  90 tablet    Refill:  1    Order Specific Question:  Supervising Provider    Answer:  Crecencio Mc [[id number]]  . lisinopril (PRINIVIL,ZESTRIL) 40 MG tablet    Sig: Take 1 tablet (40 mg total) by mouth daily.    Dispense:  90 tablet    Refill:  1    Order Specific Question:  Supervising Provider    Answer:  Deborra Medina L [[id number]]  . metoprolol succinate (TOPROL-XL) 50 MG 24 hr tablet    Sig: Take 1 tablet (50 mg total) by mouth 2 (two) times daily. Take with or immediately following a meal.    Dispense:  180 tablet    Refill:  1    Order Specific Question:  Supervising Provider    Answer:  Deborra Medina L [[id number]]  . spironolactone (ALDACTONE) 50 MG tablet    Sig: Take 1 tablet (50 mg total) by mouth daily.    Dispense:  90 tablet    Refill:  1    Order Specific Question:  Supervising Provider    Answer:  Crecencio Mc [[id number]]     Follow-up: Return in about 4 weeks (around 07/09/2015) for Lab appointment.

## 2015-06-11 NOTE — Patient Instructions (Addendum)
Follow up in 1 month for labs (make LAB appointment)

## 2015-06-12 ENCOUNTER — Ambulatory Visit: Payer: Managed Care, Other (non HMO) | Admitting: Internal Medicine

## 2015-06-21 ENCOUNTER — Encounter: Payer: Self-pay | Admitting: Nurse Practitioner

## 2015-06-21 DIAGNOSIS — R221 Localized swelling, mass and lump, neck: Secondary | ICD-10-CM | POA: Insufficient documentation

## 2015-06-21 NOTE — Assessment & Plan Note (Signed)
Medications refilled as requested. Xanax was only filled x 1 month due to not being her PCP. [facility] checked for polypharmacy and pt was found compliant.

## 2015-06-21 NOTE — Assessment & Plan Note (Signed)
Possible lipoma. Pt refuses [place] at this time. Discussed w/ Dr. Nicki Reaper since she is pt PCP and this is our first face to face visit. Pt reports due to financial difficulties she must space out her visit/labs/US. Will consider later.

## 2015-06-21 NOTE — Assessment & Plan Note (Signed)
Lipid panel will be obtained today. Filled statin as requested.

## 2015-06-21 NOTE — Assessment & Plan Note (Signed)
Patient's medications as requested.  BP Readings from Last 3 Encounters:  06/11/15 132/84  01/16/14 168/98  01/09/14 162/104   Will obtain CMET and CBC w/ diff today.

## 2015-07-19 ENCOUNTER — Other Ambulatory Visit: Payer: Self-pay | Admitting: Nurse Practitioner

## 2015-07-19 NOTE — Telephone Encounter (Signed)
Please advise 

## 2015-09-11 ENCOUNTER — Other Ambulatory Visit: Payer: Self-pay | Admitting: Nurse Practitioner

## 2015-09-14 ENCOUNTER — Other Ambulatory Visit: Payer: Self-pay | Admitting: Internal Medicine

## 2015-09-14 NOTE — Telephone Encounter (Signed)
Please advise refill as last OV was March of 2015 with you.  Thanks

## 2015-09-16 NOTE — Telephone Encounter (Signed)
I have not seen her since 12/2013.  She was given 30 tablets in 07/2015.  When I last saw her, she was not taking these on a regular basis (was using infrequently).  Needs an earlier f/u appt with me.

## 2015-11-21 ENCOUNTER — Encounter: Payer: Managed Care, Other (non HMO) | Admitting: Internal Medicine

## 2015-12-06 ENCOUNTER — Other Ambulatory Visit: Payer: Self-pay | Admitting: Nurse Practitioner

## 2016-01-17 ENCOUNTER — Encounter: Payer: Managed Care, Other (non HMO) | Admitting: Internal Medicine

## 2016-02-28 ENCOUNTER — Other Ambulatory Visit: Payer: Self-pay

## 2016-02-28 MED ORDER — CITALOPRAM HYDROBROMIDE 40 MG PO TABS: ORAL_TABLET | ORAL | Status: DC

## 2016-02-28 MED ORDER — AMLODIPINE BESYLATE 5 MG PO TABS: ORAL_TABLET | ORAL | Status: DC

## 2016-02-28 MED ORDER — LISINOPRIL 40 MG PO TABS: ORAL_TABLET | ORAL | Status: DC

## 2016-02-28 MED ORDER — METOPROLOL SUCCINATE ER 50 MG PO TB24: ORAL_TABLET | ORAL | Status: DC

## 2016-02-28 MED ORDER — SPIRONOLACTONE 50 MG PO TABS: ORAL_TABLET | ORAL | Status: DC

## 2016-02-29 ENCOUNTER — Ambulatory Visit (INDEPENDENT_AMBULATORY_CARE_PROVIDER_SITE_OTHER): Payer: Managed Care, Other (non HMO) | Admitting: Family Medicine

## 2016-02-29 ENCOUNTER — Encounter: Payer: Self-pay | Admitting: Family Medicine

## 2016-02-29 VITALS — BP 130/86 | HR 65 | Temp 98.6°F | Ht 67.75 in | Wt 254.1 lb

## 2016-02-29 DIAGNOSIS — T148 Other injury of unspecified body region: Secondary | ICD-10-CM

## 2016-02-29 DIAGNOSIS — W57XXXA Bitten or stung by nonvenomous insect and other nonvenomous arthropods, initial encounter: Secondary | ICD-10-CM | POA: Insufficient documentation

## 2016-02-29 MED ORDER — DOXYCYCLINE HYCLATE 100 MG PO TABS: 100.0000 mg | ORAL_TABLET | Freq: Two times a day (BID) | ORAL | Status: DC

## 2016-02-29 NOTE — Assessment & Plan Note (Signed)
New problem. Given erythema and persistence, treating empirically with Doxycycline.

## 2016-02-29 NOTE — Progress Notes (Signed)
   Subjective:  [name], female    DOB: [birth year]  Age: 54 y.o. MRN: [id number]  CC: Tick bite  HPI:  55 year old female presents with complaints of tick bite.  Tick bite  Patient states she was bitten by a tick on 4/23.  Tick was removed without difficulty by her husband.  Since that time she's had pain at the site as well as intermittent redness.  No associated fevers or chills. No rash noted.  She does report that she's been fatigued and has been experiencing headaches more frequently than usual.  No known exacerbating or relieving factors.  No interventions tried other than local wound care.  No other complaints at this time.  Social Hx   Social History   Social History  . Marital Status: Married    Spouse Name: N/A  . Number of Children: N/A  . Years of Education: N/A   Social History Main Topics  . Smoking status: Former Research scientist (life sciences)  . Smokeless tobacco: Never Used  . Alcohol Use: Yes  . Drug Use: No  . Sexual Activity: Not Asked   Other Topics Concern  . None   Social History Narrative   Review of Systems  Constitutional: Positive for fatigue. Negative for fever.  Skin: Negative for rash.   Objective:  BP 130/86 mmHg  Pulse 65  Temp(Src) 98.6 F (37 C) (Oral)  Ht 5' 7.75" (1.721 m)  Wt 254 lb 2 oz (115.27 kg)  BMI 38.92 kg/m2  SpO2 98%  BP/Weight 02/29/2016 [id number]  Systolic BP [id number]  Diastolic BP 86 84 98  Wt. (Lbs) 254.13 241 266  BMI 38.92 36.91 40.74   Physical Exam  Constitutional: She is oriented to person, place, and time. She appears well-developed.  Cardiovascular: Normal rate and regular rhythm.   Pulmonary/Chest: Effort normal. She has no wheezes. She has no rales.  Neurological: She is alert and oriented to person, place, and time.  Skin: She is not diaphoretic.  Left lateral thoracic region - less than 0.5 cm raised area with 2 central small openings. Surrounding erythema. Small amount of  induration. No drainage or fluctuance.  Psychiatric: She has a normal mood and affect.  Vitals reviewed.  Lab Results  Component Value Date   WBC 8.6 01/16/2014   HGB 14.7 01/16/2014   HCT 42.7 01/16/2014   PLT 233.0 01/16/2014   GLUCOSE 94 12/12/2013   CHOL 238* 12/12/2013   TRIG 438.0* 12/12/2013   HDL 47.20 12/12/2013   LDLCALC 103* 12/12/2013   ALT 18 12/12/2013   AST 16 12/12/2013   NA 138 12/12/2013   K 4.7 12/12/2013   CL 104 12/12/2013   CREATININE 0.6 12/12/2013   BUN 14 12/12/2013   CO2 26 12/12/2013   TSH 0.53 06/21/2013    Assessment & Plan:   Problem List Items Addressed This Visit    Tick bite - Primary    New problem. Given erythema and persistence, treating empirically with Doxycycline.         Meds ordered this encounter  Medications  . doxycycline (VIBRA-TABS) 100 MG tablet    Sig: Take 1 tablet (100 mg total) by mouth 2 (two) times daily.    Dispense:  14 tablet    Refill:  0   Follow-up: PRN  [place]

## 2016-02-29 NOTE — Patient Instructions (Signed)
Take the medication as prescribed. Take with food. Don't forget it causes photosensitivity.  Follow-up if you fail to improve or worsen.  Take care  Dr. Lacinda Axon

## 2016-03-11 ENCOUNTER — Other Ambulatory Visit: Payer: Self-pay | Admitting: Family Medicine

## 2016-03-11 MED ORDER — ATORVASTATIN CALCIUM 20 MG PO TABS: ORAL_TABLET | ORAL | Status: DC

## 2016-03-11 NOTE — Telephone Encounter (Signed)
Disregard this patient is Dr.Scott's.

## 2016-03-11 NOTE — Telephone Encounter (Signed)
Last refilled 11/2015. Last seen by Dr.Cook. No future appointment with another provider. Please advise?

## 2016-08-26 ENCOUNTER — Other Ambulatory Visit: Payer: Self-pay | Admitting: Internal Medicine

## 2016-09-15 ENCOUNTER — Other Ambulatory Visit: Payer: Self-pay | Admitting: Internal Medicine

## 2016-12-13 ENCOUNTER — Other Ambulatory Visit: Payer: Self-pay | Admitting: Internal Medicine

## 2016-12-15 NOTE — Telephone Encounter (Signed)
Last office visit was a acute on on 02/29/16, prior to that a yearly was done on 06/11/15 with NP.  Last visit with you was with in 2015, please advise for refills. thanks

## 2016-12-16 NOTE — Telephone Encounter (Signed)
She has not been seen in our office since 2016.  I have not seen her since 2015.  She has been informed multiple times, needs appts before refills.  Needs appt!

## 2017-03-02 ENCOUNTER — Telehealth: Payer: Self-pay | Admitting: Internal Medicine

## 2017-03-02 ENCOUNTER — Other Ambulatory Visit: Payer: Self-pay | Admitting: Internal Medicine

## 2017-03-02 NOTE — Telephone Encounter (Signed)
Pt came in wanting to get fasting labs done. Need orders please and thank you!  Call pt @ [phone]. Message can be left.

## 2017-03-03 NOTE — Telephone Encounter (Signed)
App made with patient for f/u letter mailed with date and time. Patient informed that we will not be able to do any labs until she has been seen for follow up.

## 2017-03-03 NOTE — Telephone Encounter (Signed)
I have not seen her in the last few years.  She needs a scheduled f/u appt and will schedule labs at appt.

## 2017-03-03 NOTE — Telephone Encounter (Signed)
Not sure what labs needed to be done

## 2017-05-22 ENCOUNTER — Encounter: Payer: Self-pay | Admitting: Internal Medicine

## 2017-05-22 ENCOUNTER — Ambulatory Visit (INDEPENDENT_AMBULATORY_CARE_PROVIDER_SITE_OTHER): Payer: Managed Care, Other (non HMO) | Admitting: Internal Medicine

## 2017-05-22 VITALS — BP 152/98 | HR 79 | Temp 99.1°F | Ht 67.75 in | Wt 255.4 lb

## 2017-05-22 DIAGNOSIS — K219 Gastro-esophageal reflux disease without esophagitis: Secondary | ICD-10-CM | POA: Diagnosis not present

## 2017-05-22 DIAGNOSIS — M25552 Pain in left hip: Secondary | ICD-10-CM

## 2017-05-22 DIAGNOSIS — F419 Anxiety disorder, unspecified: Secondary | ICD-10-CM | POA: Diagnosis not present

## 2017-05-22 DIAGNOSIS — E7800 Pure hypercholesterolemia, unspecified: Secondary | ICD-10-CM | POA: Diagnosis not present

## 2017-05-22 DIAGNOSIS — I10 Essential (primary) hypertension: Secondary | ICD-10-CM | POA: Diagnosis not present

## 2017-05-22 LAB — CBC WITH DIFFERENTIAL/PLATELET
BASOS ABS: 96 {cells}/uL (ref 0–200)
Basophils Relative: 1 %
EOS PCT: 1 %
Eosinophils Absolute: 96 cells/uL (ref 15–500)
HCT: 44.2 % (ref 35.0–45.0)
Hemoglobin: 14.9 g/dL (ref 11.7–15.5)
LYMPHS PCT: 31 %
Lymphs Abs: 2976 cells/uL (ref 850–3900)
MCH: 31.5 pg (ref 27.0–33.0)
MCHC: 33.7 g/dL (ref 32.0–36.0)
MCV: 93.4 fL (ref 80.0–100.0)
MONOS PCT: 6 %
MPV: 11.1 fL (ref 7.5–12.5)
Monocytes Absolute: 576 cells/uL (ref 200–950)
NEUTROS ABS: 5856 {cells}/uL (ref 1500–7800)
Neutrophils Relative %: 61 %
PLATELETS: 257 10*3/uL (ref 140–400)
RBC: 4.73 MIL/uL (ref 3.80–5.10)
RDW: 13.6 % (ref 11.0–15.0)
WBC: 9.6 10*3/uL (ref 3.8–10.8)

## 2017-05-22 LAB — TSH: TSH: 0.61 m[IU]/L

## 2017-05-22 MED ORDER — LISINOPRIL 40 MG PO TABS: ORAL_TABLET | ORAL | 2 refills | Status: DC

## 2017-05-22 MED ORDER — CITALOPRAM HYDROBROMIDE 20 MG PO TABS: 20.0000 mg | ORAL_TABLET | Freq: Every day | ORAL | 2 refills | Status: DC

## 2017-05-22 NOTE — Patient Instructions (Signed)
Zantac (ranitidine) 150mg - take one tablet 30 minutes before breakfast and one tablet 30 minutes before your evening meal.   

## 2017-05-22 NOTE — Progress Notes (Signed)
Pre visit review using our clinic review tool, if applicable. No additional management support is needed unless otherwise documented below in the visit note. 

## 2017-05-22 NOTE — Progress Notes (Signed)
[name], female   DOB: 12-18-1960, 56 y.o.   MRN: [id number]   Subjective:    [name], female    DOB: 10/14/1960, 56 y.o.   MRN: [id number]  HPI  Patient here for a scheduled follow up.  I have not seen her since 2015.  She states she is doing relatively well.  Increased stress with her mother's health issues.  Discussed with her today.  She reports some increased anxiety and stress related to this.  Off all medications.  Has not followed up here.  Desires to restart citalopram.  Discussed diet and exercise.  No chest pain.  No sob. Some acid reflux.  Controlled if takes omeprazole.  No abdominal pain.  Bowels moving.  Does report some pian in her left hip.  Previously saw Dr Sharlet Salina.  S/p injection.  Helped.  Flares.  Does not feel needs any further intervention at this time.  Overdue mammogram and labs.     Past Medical History:  Diagnosis Date  . Allergy   . Anemia   . Depression   . Diverticulosis   . FHx: migraine headaches   . History of chickenpox   . Hypercholesterolemia   . Hypertension   . Osteoarthritis    Past Surgical History:  Procedure Laterality Date  . Child Birth  19 and 58  . LAPAROSCOPIC SUPRACERVICAL HYSTERECTOMY  2006   ovaries left in place  . left elbow    . left knee    . Miscarriage  1987  . TONSILLECTOMY  1963  . TUBAL LIGATION  1993   Family History  Problem Relation Age of Onset  . Heart disease Mother        Incomplete heart block  . Hypertension Mother   . Diabetes Mother   . Nephrolithiasis Mother   . Migraines Mother   . Arthritis Mother        degenerative-back,osteoporosis  . Heart disease Father 42       Myocardial infarction  . Heart disease Sister        h/o MI  . Migraines Sister    Social History   Social History  . Marital status: Married    Spouse name: N/A  . Number of children: N/A  . Years of education: N/A   Social History Main Topics  . Smoking status: Former Research scientist (life sciences)  .  Smokeless tobacco: Never Used  . Alcohol use Yes  . Drug use: No  . Sexual activity: Not Asked   Other Topics Concern  . None   Social History Narrative  . None    Outpatient Encounter Prescriptions as of 05/22/2017  Medication Sig  . acetaminophen (TYLENOL) 650 MG CR tablet Take 650 mg by mouth every 8 (eight) hours as needed for pain.  Marland Kitchen atorvastatin (LIPITOR) 20 MG tablet TAKE 1 TABLET BY MOUTH DAILY  . doxycycline (VIBRA-TABS) 100 MG tablet Take 1 tablet (100 mg total) by mouth 2 (two) times daily.  . Lactase (LACTAID FAST ACT) 9000 UNITS TABS Take by mouth.  Marland Kitchen lisinopril (PRINIVIL,ZESTRIL) 40 MG tablet TAKE 1 TABLET(40 MG) BY MOUTH DAILY  . [DISCONTINUED] ALPRAZolam (XANAX) 0.25 MG tablet TAKE 1 TABLET BY MOUTH EVERY DAY AS NEEDED FOR ANXIETY  . [DISCONTINUED] amLODipine (NORVASC) 5 MG tablet TAKE 1 TABLET(5 MG) BY MOUTH TWICE DAILY  . [DISCONTINUED] citalopram (CELEXA) 40 MG tablet TAKE 1 TABLET(40 MG) BY MOUTH DAILY  . [DISCONTINUED] lisinopril (PRINIVIL,ZESTRIL) 40 MG tablet TAKE 1 TABLET(40  MG) BY MOUTH DAILY  . [DISCONTINUED] metoprolol succinate (TOPROL-XL) 50 MG 24 hr tablet TAKE 1 TABLET BY MOUTH 2 TIMES DAILY. TAKE WITH OR IMMEDIATELY AFTER A MEAL.  . [DISCONTINUED] omeprazole (PRILOSEC) 20 MG capsule Take 20 mg by mouth daily.  . [DISCONTINUED] spironolactone (ALDACTONE) 50 MG tablet TAKE 1 TABLET(50 MG) BY MOUTH DAILY  . citalopram (CELEXA) 20 MG tablet Take 1 tablet (20 mg total) by mouth daily.   No facility-administered encounter medications on file as of 05/22/2017.     Review of Systems  Constitutional: Negative for appetite change and unexpected weight change.  HENT: Negative for congestion and sinus pressure.   Respiratory: Negative for cough, chest tightness and shortness of breath.   Cardiovascular: Negative for chest pain, palpitations and leg swelling.  Gastrointestinal: Negative for abdominal pain, diarrhea, nausea and vomiting.       Acid reflux.      Genitourinary: Negative for difficulty urinating and dysuria.  Musculoskeletal: Negative for myalgias.       Hip pain as outlined.    Skin: Negative for color change and rash.  Neurological: Negative for dizziness, light-headedness and headaches.  Psychiatric/Behavioral: Negative for agitation and dysphoric mood.       Increased stress and anxiety as outlined.         Objective:     Blood pressure rechecked by me:  150/88  Physical Exam  Constitutional: She appears well-developed and well-nourished. No distress.  HENT:  Nose: Nose normal.  Mouth/Throat: Oropharynx is clear and moist.  Neck: Neck supple. No thyromegaly present.  Cardiovascular: Normal rate and regular rhythm.   Pulmonary/Chest: Breath sounds normal. No respiratory distress. She has no wheezes.  Abdominal: Soft. Bowel sounds are normal. There is no tenderness.  Musculoskeletal: She exhibits no edema or tenderness.  Lymphadenopathy:    She has no cervical adenopathy.  Skin: No rash noted. No erythema.  Psychiatric: She has a normal mood and affect. Her behavior is normal.    BP (!) 152/98   Pulse 79   Temp 99.1 F (37.3 C) (Oral)   Ht 5' 7.75" (1.721 m)   Wt 255 lb 6.4 oz (115.8 kg)   SpO2 97%   BMI 39.12 kg/m  Wt Readings from Last 3 Encounters:  05/22/17 255 lb 6.4 oz (115.8 kg)  02/29/16 254 lb 2 oz (115.3 kg)  06/11/15 241 lb (109.3 kg)     Lab Results  Component Value Date   WBC 9.6 05/22/2017   HGB 14.9 05/22/2017   HCT 44.2 05/22/2017   PLT 257 05/22/2017   GLUCOSE 91 05/22/2017   CHOL 311 (H) 05/22/2017   TRIG 187 (H) 05/22/2017   HDL 57 05/22/2017   LDLCALC 217 (H) 05/22/2017   ALT 38 (H) 05/22/2017   AST 40 (H) 05/22/2017   NA 137 05/22/2017   K 4.6 05/22/2017   CL 101 05/22/2017   CREATININE 0.65 05/22/2017   BUN 12 05/22/2017   CO2 22 05/22/2017   TSH 0.61 05/22/2017    Dg Hip Complete Right  Result Date: 01/09/2014 CLINICAL DATA:  Lateral right hip pain after a fall  EXAM: RIGHT HIP - COMPLETE 2+ VIEW COMPARISON:  None. FINDINGS: Pelvic bones are intact. No significant hip joint space narrowing. No fracture or dislocation. IMPRESSION: Negative. Electronically Signed   By: Skipper Cliche M.D.   On: 01/09/2014 11:46       Assessment & Plan:   Problem List Items Addressed This Visit    Anxiety  Restart citalopram.  Follow closely.  Citalopram worked well for her in the past.        Relevant Medications   citalopram (CELEXA) 20 MG tablet   Essential hypertension, benign    Blood pressure elevated.  Restart lisinopril 40mg  q day.  Follow pressures.  adjuste medication as needed.  Check metabolic panel.        Relevant Medications   lisinopril (PRINIVIL,ZESTRIL) 40 MG tablet   Other Relevant Orders   CBC with Differential/Platelet (Completed)   TSH (Completed)   Basic metabolic panel (Completed)   GERD (gastroesophageal reflux disease)    Will change from omeprozole to zantac (given restarting citalopram).  Follow.  See if controls symptoms.        Hypercholesterolemia - Primary    Low cholesterol diet and exercise.  Was previously on lipitor.  Off now.  Recheck cholesterol.  Will restart cholesterol medication pending results.        Relevant Medications   lisinopril (PRINIVIL,ZESTRIL) 40 MG tablet   Other Relevant Orders   Hepatic function panel (Completed)   Lipid panel (Completed)    Other Visit Diagnoses    Left hip pain       Previously saw Dr Sharlet Salina.  s/p injection.  does not feel needs any further intervention at this time.  follow.         Einar Pheasant, MD

## 2017-05-23 LAB — BASIC METABOLIC PANEL
BUN: 12 mg/dL (ref 7–25)
CO2: 22 mmol/L (ref 20–32)
CREATININE: 0.65 mg/dL (ref 0.50–1.05)
Calcium: 9.8 mg/dL (ref 8.6–10.4)
Chloride: 101 mmol/L (ref 98–110)
GLUCOSE: 91 mg/dL (ref 65–99)
Potassium: 4.6 mmol/L (ref 3.5–5.3)
Sodium: 137 mmol/L (ref 135–146)

## 2017-05-23 LAB — HEPATIC FUNCTION PANEL
ALBUMIN: 4.6 g/dL (ref 3.6–5.1)
ALK PHOS: 61 U/L (ref 33–130)
ALT: 38 U/L — ABNORMAL HIGH (ref 6–29)
AST: 40 U/L — ABNORMAL HIGH (ref 10–35)
BILIRUBIN TOTAL: 0.7 mg/dL (ref 0.2–1.2)
Bilirubin, Direct: 0.1 mg/dL (ref <=0.2)
Indirect Bilirubin: 0.6 mg/dL (ref 0.2–1.2)
TOTAL PROTEIN: 7.6 g/dL (ref 6.1–8.1)

## 2017-05-23 LAB — LIPID PANEL
CHOL/HDL RATIO: 5.5 ratio — AB (ref <5.0)
CHOLESTEROL: 311 mg/dL — AB (ref <200)
HDL: 57 mg/dL (ref >50)
LDL Cholesterol: 217 mg/dL — ABNORMAL HIGH (ref <100)
TRIGLYCERIDES: 187 mg/dL — AB (ref <150)
VLDL: 37 mg/dL — ABNORMAL HIGH (ref <30)

## 2017-05-24 ENCOUNTER — Other Ambulatory Visit: Payer: Self-pay | Admitting: Internal Medicine

## 2017-05-24 ENCOUNTER — Encounter: Payer: Self-pay | Admitting: Internal Medicine

## 2017-05-24 DIAGNOSIS — R945 Abnormal results of liver function studies: Secondary | ICD-10-CM

## 2017-05-24 DIAGNOSIS — R7989 Other specified abnormal findings of blood chemistry: Secondary | ICD-10-CM

## 2017-05-24 NOTE — Assessment & Plan Note (Addendum)
Will change from omeprozole to zantac (given restarting citalopram).  Follow.  See if controls symptoms.

## 2017-05-24 NOTE — Progress Notes (Signed)
Order placed for f/u liver panel.  

## 2017-05-24 NOTE — Assessment & Plan Note (Signed)
Blood pressure elevated.  Restart lisinopril 40mg  q day.  Follow pressures.  adjuste medication as needed.  Check metabolic panel.

## 2017-05-24 NOTE — Assessment & Plan Note (Signed)
Low cholesterol diet and exercise.  Was previously on lipitor.  Off now.  Recheck cholesterol.  Will restart cholesterol medication pending results.

## 2017-05-24 NOTE — Assessment & Plan Note (Signed)
Restart citalopram.  Follow closely.  Citalopram worked well for her in the past.

## 2017-05-26 ENCOUNTER — Other Ambulatory Visit: Payer: Self-pay | Admitting: Internal Medicine

## 2017-05-26 DIAGNOSIS — R7989 Other specified abnormal findings of blood chemistry: Secondary | ICD-10-CM

## 2017-05-26 DIAGNOSIS — R945 Abnormal results of liver function studies: Secondary | ICD-10-CM

## 2017-05-26 MED ORDER — ATORVASTATIN CALCIUM 20 MG PO TABS: 20.0000 mg | ORAL_TABLET | Freq: Every day | ORAL | 0 refills | Status: DC

## 2017-05-26 NOTE — Addendum Note (Signed)
Addended by: Francella Solian on: 05/26/2017 01:12 PM   Modules accepted: Orders

## 2017-05-26 NOTE — Progress Notes (Signed)
Order placed for f/u liver panel.  

## 2017-06-08 ENCOUNTER — Other Ambulatory Visit: Payer: Managed Care, Other (non HMO)

## 2017-06-09 ENCOUNTER — Ambulatory Visit: Payer: Managed Care, Other (non HMO) | Admitting: Internal Medicine

## 2017-07-16 ENCOUNTER — Telehealth: Payer: Self-pay

## 2017-07-16 NOTE — Telephone Encounter (Signed)
Mailed latter received from patient. Placed in your blue folder.

## 2017-07-17 NOTE — Telephone Encounter (Signed)
Heather Blake can you move patient app can you move app from the 10-19 to 10/10 at 12:30 patient is aware.

## 2017-07-17 NOTE — Telephone Encounter (Signed)
Scheduled as requested.

## 2017-07-17 NOTE — Telephone Encounter (Signed)
Read her letter.  Given her concerns, upcoming cataract surgery, etc- she needs an appt to discuss.  Blood pressure is still a little higher than I would like.  Also need to discuss adding blood pressure medication.  Schedule her for a f/u appt with me on Wednesday 07/22/17 at 12:30.

## 2017-07-22 ENCOUNTER — Encounter: Payer: Self-pay | Admitting: Internal Medicine

## 2017-07-22 ENCOUNTER — Ambulatory Visit (INDEPENDENT_AMBULATORY_CARE_PROVIDER_SITE_OTHER): Payer: Managed Care, Other (non HMO) | Admitting: Internal Medicine

## 2017-07-22 VITALS — BP 140/82 | HR 84 | Temp 98.4°F | Resp 20 | Wt 259.5 lb

## 2017-07-22 DIAGNOSIS — F419 Anxiety disorder, unspecified: Secondary | ICD-10-CM

## 2017-07-22 DIAGNOSIS — I10 Essential (primary) hypertension: Secondary | ICD-10-CM

## 2017-07-22 DIAGNOSIS — R079 Chest pain, unspecified: Secondary | ICD-10-CM | POA: Diagnosis not present

## 2017-07-22 DIAGNOSIS — E7800 Pure hypercholesterolemia, unspecified: Secondary | ICD-10-CM

## 2017-07-22 DIAGNOSIS — K219 Gastro-esophageal reflux disease without esophagitis: Secondary | ICD-10-CM | POA: Diagnosis not present

## 2017-07-22 LAB — HEPATIC FUNCTION PANEL
ALT: 23 U/L (ref 0–35)
AST: 27 U/L (ref 0–37)
Albumin: 4.5 g/dL (ref 3.5–5.2)
Alkaline Phosphatase: 59 U/L (ref 39–117)
BILIRUBIN DIRECT: 0.1 mg/dL (ref 0.0–0.3)
TOTAL PROTEIN: 7.7 g/dL (ref 6.0–8.3)
Total Bilirubin: 0.4 mg/dL (ref 0.2–1.2)

## 2017-07-22 LAB — BASIC METABOLIC PANEL
BUN: 11 mg/dL (ref 6–23)
CHLORIDE: 101 meq/L (ref 96–112)
CO2: 29 mEq/L (ref 19–32)
Calcium: 9.9 mg/dL (ref 8.4–10.5)
Creatinine, Ser: 0.71 mg/dL (ref 0.40–1.20)
GFR: 89.75 mL/min (ref >60.00)
Glucose, Bld: 119 mg/dL — ABNORMAL HIGH (ref 70–99)
POTASSIUM: 4.6 meq/L (ref 3.5–5.1)
Sodium: 139 mEq/L (ref 135–145)

## 2017-07-22 MED ORDER — AMLODIPINE BESYLATE 5 MG PO TABS: 5.0000 mg | ORAL_TABLET | Freq: Every day | ORAL | 1 refills | Status: DC

## 2017-07-22 MED ORDER — CITALOPRAM HYDROBROMIDE 40 MG PO TABS: 40.0000 mg | ORAL_TABLET | Freq: Every day | ORAL | 2 refills | Status: DC

## 2017-07-22 NOTE — Progress Notes (Signed)
[name], female   DOB: Apr 18, 1961, 56 y.o.   MRN: [id number]   Subjective:    [name], female    DOB: 1960-12-01, 56 y.o.   MRN: [id number]  HPI  Patient here as a work in to discuss her elevated blood pressure and plans for cataract surgery.  She is accompanied by her husband.  History obtained from both of them.  Increased stress with her decreased vision.  Has created increased anxiety.  Planning to have cataract surgery on her left eye 08/11/17 and right eye 09/01/17.  Describes intermittent chest pain.  States had an episode three weeks ago that woke her from sleep.  Took aspirin. Described as a piercing pain.  Also has episodes of chest pain described as chest tightness.  Episodes usually do not last more than 30 minutes.  Some sob with exertion.  She has related this to being out of shape.  Also describes some intermittent flares with nausea and diarrhea.  May occur 2-3x/week.  Takes an increased amount of immodium.  Discussed her increased anxiety.  She is on citalopram 20mg  q day.  Was on 40mg  previously.  Feels the citalopram has helped.  Discussed increasing the dose back up to 40mg  q day.  Blood pressure still a little elevated.  Only on lisinopril 40mg  q day now.  Just started back on last visit.     Past Medical History:  Diagnosis Date  . Allergy   . Anemia   . Depression   . Diverticulosis   . FHx: migraine headaches   . History of chickenpox   . Hypercholesterolemia   . Hypertension   . Osteoarthritis    Past Surgical History:  Procedure Laterality Date  . Child Birth  51 and 73  . LAPAROSCOPIC SUPRACERVICAL HYSTERECTOMY  2006   ovaries left in place  . left elbow    . left knee    . Miscarriage  1987  . TONSILLECTOMY  1963  . TUBAL LIGATION  1993   Family History  Problem Relation Age of Onset  . Heart disease Mother        Incomplete heart block  . Hypertension Mother   . Diabetes Mother   . Nephrolithiasis Mother     . Migraines Mother   . Arthritis Mother        degenerative-back,osteoporosis  . Heart disease Father 44       Myocardial infarction  . Heart disease Sister        h/o MI  . Migraines Sister    Social History   Social History  . Marital status: Married    Spouse name: N/A  . Number of children: N/A  . Years of education: N/A   Social History Main Topics  . Smoking status: Former Research scientist (life sciences)  . Smokeless tobacco: Never Used  . Alcohol use Yes  . Drug use: No  . Sexual activity: Not Asked   Other Topics Concern  . None   Social History Narrative  . None    Outpatient Encounter Prescriptions as of 07/22/2017  Medication Sig  . acetaminophen (TYLENOL) 650 MG CR tablet Take 650 mg by mouth every 8 (eight) hours as needed for pain.  Marland Kitchen atorvastatin (LIPITOR) 20 MG tablet TAKE 1 TABLET BY MOUTH DAILY  . Lactase (LACTAID FAST ACT) 9000 UNITS TABS Take by mouth.  Marland Kitchen lisinopril (PRINIVIL,ZESTRIL) 40 MG tablet TAKE 1 TABLET(40 MG) BY MOUTH DAILY  . [DISCONTINUED] citalopram (CELEXA) 20 MG  tablet Take 1 tablet (20 mg total) by mouth daily.  Marland Kitchen amLODipine (NORVASC) 5 MG tablet Take 1 tablet (5 mg total) by mouth daily.  . citalopram (CELEXA) 40 MG tablet Take 1 tablet (40 mg total) by mouth daily.  Marland Kitchen doxycycline (VIBRA-TABS) 100 MG tablet Take 1 tablet (100 mg total) by mouth 2 (two) times daily. (Patient not taking: Reported on 07/22/2017)  . [DISCONTINUED] atorvastatin (LIPITOR) 20 MG tablet Take 1 tablet (20 mg total) by mouth daily.   No facility-administered encounter medications on file as of 07/22/2017.     Review of Systems  Constitutional: Negative for appetite change and unexpected weight change.  HENT: Negative for congestion.   Respiratory: Positive for shortness of breath. Negative for cough and chest tightness.   Cardiovascular: Positive for chest pain. Negative for palpitations and leg swelling.  Gastrointestinal: Positive for diarrhea and nausea. Negative for abdominal  pain and vomiting.  Genitourinary: Negative for difficulty urinating and dysuria.  Musculoskeletal: Negative for joint swelling and myalgias.  Skin: Negative for color change and rash.  Neurological: Negative for dizziness, light-headedness and headaches.  Psychiatric/Behavioral: Negative for dysphoric mood.       Increased anxiety as outlined.         Objective:    Physical Exam  Constitutional: She appears well-developed and well-nourished. No distress.  HENT:  Nose: Nose normal.  Mouth/Throat: Oropharynx is clear and moist.  Neck: Neck supple. No thyromegaly present.  Cardiovascular: Normal rate and regular rhythm.   Pulmonary/Chest: Breath sounds normal. No respiratory distress. She has no wheezes.  Abdominal: Soft. Bowel sounds are normal. There is no tenderness.  Musculoskeletal: She exhibits no edema or tenderness.  Lymphadenopathy:    She has no cervical adenopathy.  Skin: No rash noted. No erythema.  Psychiatric: She has a normal mood and affect. Her behavior is normal.    BP 140/82 (BP Location: Left Arm, Patient Position: Sitting, Cuff Size: Normal)   Pulse 84   Temp 98.4 F (36.9 C) (Oral)   Resp 20   Wt 259 lb 8 oz (117.7 kg)   SpO2 98%   BMI 39.75 kg/m  Wt Readings from Last 3 Encounters:  07/22/17 259 lb 8 oz (117.7 kg)  05/22/17 255 lb 6.4 oz (115.8 kg)  02/29/16 254 lb 2 oz (115.3 kg)     Lab Results  Component Value Date   WBC 9.6 05/22/2017   HGB 14.9 05/22/2017   HCT 44.2 05/22/2017   PLT 257 05/22/2017   GLUCOSE 119 (H) 07/22/2017   CHOL 311 (H) 05/22/2017   TRIG 187 (H) 05/22/2017   HDL 57 05/22/2017   LDLCALC 217 (H) 05/22/2017   ALT 23 07/22/2017   AST 27 07/22/2017   NA 139 07/22/2017   K 4.6 07/22/2017   CL 101 07/22/2017   CREATININE 0.71 07/22/2017   BUN 11 07/22/2017   CO2 29 07/22/2017   TSH 0.61 05/22/2017    Dg Hip Complete Right  Result Date: 01/09/2014 CLINICAL DATA:  Lateral right hip pain after a fall EXAM: RIGHT  HIP - COMPLETE 2+ VIEW COMPARISON:  None. FINDINGS: Pelvic bones are intact. No significant hip joint space narrowing. No fracture or dislocation. IMPRESSION: Negative. Electronically Signed   By: Skipper Cliche M.D.   On: 01/09/2014 11:46       Assessment & Plan:   Problem List Items Addressed This Visit    Anxiety    Increased anxiety.  Discussed at length with her today.  Discussed  increasing citalopram.  Agrees.  Will increase to 40mg  q day.  Follow closely.        Relevant Medications   citalopram (CELEXA) 40 MG tablet   Chest pain - Primary    EKG obtained today.  SR with no acute ischemic changes.  Feel needs further cardiac w/up given persistent intermittent chest pain and with plans for upcoming surgery.  Discussed at length with her and her husband.  Continue risk factor modification.  Appt made with cardiology for Friday 07/24/17.        Relevant Orders   EKG 12-Lead (Completed)   Essential hypertension, benign    Blood pressure remains elevated.  Add amlodipine 5mg  q day.  Follow pressures.  Follow metabolic panel.       Relevant Medications   amLODipine (NORVASC) 5 MG tablet   Other Relevant Orders   Basic metabolic panel (Completed)   GERD (gastroesophageal reflux disease)    Zantac.  Follow.        Hypercholesterolemia    On lipitor.  Low cholesterol diet and exercise.  Follow lipid panel and liver function tests.        Relevant Medications   amLODipine (NORVASC) 5 MG tablet   Other Relevant Orders   Hepatic function panel (Completed)     I spent 45 minutes with the patient and more than 50% of the time was spent in consultation regarding the above.  Time spent discussing current symptoms and increased stress and anxiety.  Time also spent discussing plans for further w/up and treatment.     Einar Pheasant, MD

## 2017-07-22 NOTE — Patient Instructions (Signed)
Appointment with Dr Ubaldo Glassing Friday (07/24/17) at 10:30

## 2017-07-23 ENCOUNTER — Encounter: Payer: Self-pay | Admitting: Internal Medicine

## 2017-07-25 ENCOUNTER — Encounter: Payer: Self-pay | Admitting: Internal Medicine

## 2017-07-25 DIAGNOSIS — R079 Chest pain, unspecified: Secondary | ICD-10-CM | POA: Insufficient documentation

## 2017-07-25 NOTE — Assessment & Plan Note (Signed)
Zantac.  Follow.

## 2017-07-25 NOTE — Assessment & Plan Note (Signed)
Increased anxiety.  Discussed at length with her today.  Discussed increasing citalopram.  Agrees.  Will increase to 40mg  q day.  Follow closely.

## 2017-07-25 NOTE — Assessment & Plan Note (Signed)
On lipitor.  Low cholesterol diet and exercise.  Follow lipid panel and liver function tests.   

## 2017-07-25 NOTE — Assessment & Plan Note (Signed)
Blood pressure remains elevated.  Add amlodipine 5mg q day.  Follow pressures.  Follow metabolic panel.   

## 2017-07-25 NOTE — Assessment & Plan Note (Signed)
EKG obtained today.  SR with no acute ischemic changes.  Feel needs further cardiac w/up given persistent intermittent chest pain and with plans for upcoming surgery.  Discussed at length with her and her husband.  Continue risk factor modification.  Appt made with cardiology for Friday 07/24/17.

## 2017-07-28 NOTE — Telephone Encounter (Signed)
Mailed hard copy

## 2017-07-29 ENCOUNTER — Telehealth: Payer: Self-pay

## 2017-07-29 DIAGNOSIS — Z1239 Encounter for other screening for malignant neoplasm of breast: Secondary | ICD-10-CM

## 2017-07-29 NOTE — Telephone Encounter (Signed)
Called mammogram app is made at [facility] for 09/18/17 at 1pm. I have called patient and let her know that as well as mailed app information with contact information.

## 2017-07-29 NOTE — Telephone Encounter (Signed)
Letter brought by husband when he came in for his appointment. I have placed in your blue folder for review. I have put order in for mammogram will need to call after lunch for app.

## 2017-07-29 NOTE — Telephone Encounter (Signed)
Noted.  Mammogram appt scheduled.  To review note when return to office.

## 2017-07-31 ENCOUNTER — Encounter: Payer: Managed Care, Other (non HMO) | Admitting: Internal Medicine

## 2017-08-04 ENCOUNTER — Encounter: Payer: Self-pay | Admitting: *Deleted

## 2017-08-11 ENCOUNTER — Ambulatory Visit: Payer: Managed Care, Other (non HMO) | Admitting: Certified Registered"

## 2017-08-11 ENCOUNTER — Encounter
Admission: RE | Disposition: A | Discharge status: Home / Self Care | Payer: Self-pay | Source: Ambulatory Visit | Attending: Ophthalmology

## 2017-08-11 ENCOUNTER — Ambulatory Visit
Admission: RE | Admit: 2017-08-11 | Discharge: 2017-08-11 | Disposition: A | Discharge status: Home / Self Care | Payer: Managed Care, Other (non HMO) | Source: Ambulatory Visit | Attending: Ophthalmology | Admitting: Ophthalmology

## 2017-08-11 DIAGNOSIS — Z87891 Personal history of nicotine dependence: Secondary | ICD-10-CM | POA: Diagnosis not present

## 2017-08-11 DIAGNOSIS — F419 Anxiety disorder, unspecified: Secondary | ICD-10-CM | POA: Diagnosis not present

## 2017-08-11 DIAGNOSIS — F329 Major depressive disorder, single episode, unspecified: Secondary | ICD-10-CM | POA: Diagnosis not present

## 2017-08-11 DIAGNOSIS — I10 Essential (primary) hypertension: Secondary | ICD-10-CM | POA: Insufficient documentation

## 2017-08-11 DIAGNOSIS — D649 Anemia, unspecified: Secondary | ICD-10-CM | POA: Insufficient documentation

## 2017-08-11 DIAGNOSIS — Z79899 Other long term (current) drug therapy: Secondary | ICD-10-CM | POA: Diagnosis not present

## 2017-08-11 DIAGNOSIS — Z6841 Body Mass Index (BMI) 40.0 and over, adult: Secondary | ICD-10-CM | POA: Diagnosis not present

## 2017-08-11 DIAGNOSIS — K219 Gastro-esophageal reflux disease without esophagitis: Secondary | ICD-10-CM | POA: Diagnosis not present

## 2017-08-11 DIAGNOSIS — H2512 Age-related nuclear cataract, left eye: Secondary | ICD-10-CM | POA: Diagnosis present

## 2017-08-11 HISTORY — DX: Gastro-esophageal reflux disease without esophagitis: K21.9

## 2017-08-11 HISTORY — DX: Panic disorder (episodic paroxysmal anxiety): F41.0

## 2017-08-11 HISTORY — PX: CATARACT EXTRACTION W/PHACO: SHX586

## 2017-08-11 SURGERY — PHACOEMULSIFICATION, CATARACT, WITH IOL INSERTION
Anesthesia: Monitor Anesthesia Care | Site: Eye | Laterality: Left | Wound class: Clean

## 2017-08-11 MED ORDER — EPINEPHRINE PF 1 MG/ML IJ SOLN
INTRAMUSCULAR | Status: DC | PRN
  Administered 2017-08-11: 11:00:00 via OPHTHALMIC

## 2017-08-11 MED ORDER — FENTANYL CITRATE (PF) 100 MCG/2ML IJ SOLN
INTRAMUSCULAR | Status: AC
  Filled 2017-08-11: qty 2

## 2017-08-11 MED ORDER — ARMC OPHTHALMIC DILATING DROPS
OPHTHALMIC | Status: AC
  Administered 2017-08-11: 1 via OPHTHALMIC
  Filled 2017-08-11: qty 0.4

## 2017-08-11 MED ORDER — MOXIFLOXACIN HCL 0.5 % OP SOLN
OPHTHALMIC | Status: AC
  Filled 2017-08-11: qty 3

## 2017-08-11 MED ORDER — FENTANYL CITRATE (PF) 100 MCG/2ML IJ SOLN
INTRAMUSCULAR | Status: DC | PRN
  Administered 2017-08-11: 50 ug via INTRAVENOUS

## 2017-08-11 MED ORDER — ONDANSETRON HCL 4 MG/2ML IJ SOLN: 4.0000 mg | Freq: Once | INTRAMUSCULAR | Status: DC | PRN

## 2017-08-11 MED ORDER — POVIDONE-IODINE 5 % OP SOLN
OPHTHALMIC | Status: DC | PRN
  Administered 2017-08-11: 1 via OPHTHALMIC

## 2017-08-11 MED ORDER — ARMC OPHTHALMIC DILATING DROPS
1.0000 "application " | OPHTHALMIC | Status: AC
  Administered 2017-08-11 (×3): 1 via OPHTHALMIC

## 2017-08-11 MED ORDER — LIDOCAINE HCL (PF) 4 % IJ SOLN
INTRAOCULAR | Status: DC | PRN
  Administered 2017-08-11: 4 mL via OPHTHALMIC

## 2017-08-11 MED ORDER — NA CHONDROIT SULF-NA HYALURON 40-17 MG/ML IO SOLN
INTRAOCULAR | Status: DC | PRN
  Administered 2017-08-11: 1 mL via INTRAOCULAR

## 2017-08-11 MED ORDER — POVIDONE-IODINE 5 % OP SOLN
OPHTHALMIC | Status: AC
  Filled 2017-08-11: qty 30

## 2017-08-11 MED ORDER — CARBACHOL 0.01 % IO SOLN
INTRAOCULAR | Status: DC | PRN
  Administered 2017-08-11: 0.5 mL via INTRAOCULAR

## 2017-08-11 MED ORDER — LIDOCAINE HCL (PF) 4 % IJ SOLN
INTRAMUSCULAR | Status: AC
  Filled 2017-08-11: qty 5

## 2017-08-11 MED ORDER — NA CHONDROIT SULF-NA HYALURON 40-17 MG/ML IO SOLN
INTRAOCULAR | Status: AC
  Filled 2017-08-11: qty 1

## 2017-08-11 MED ORDER — MOXIFLOXACIN HCL 0.5 % OP SOLN
OPHTHALMIC | Status: DC | PRN
  Administered 2017-08-11: 0.2 mL via OPHTHALMIC

## 2017-08-11 MED ORDER — EPINEPHRINE PF 1 MG/ML IJ SOLN
INTRAMUSCULAR | Status: AC
  Filled 2017-08-11: qty 2

## 2017-08-11 MED ORDER — MOXIFLOXACIN HCL 0.5 % OP SOLN: 1.0000 [drp] | OPHTHALMIC | Status: DC | PRN

## 2017-08-11 MED ORDER — SODIUM CHLORIDE 0.9 % IV SOLN
INTRAVENOUS | Status: DC
  Administered 2017-08-11: 10:00:00 via INTRAVENOUS

## 2017-08-11 MED ORDER — FENTANYL CITRATE (PF) 100 MCG/2ML IJ SOLN: 25.0000 ug | INTRAMUSCULAR | Status: DC | PRN

## 2017-08-11 MED ORDER — MIDAZOLAM HCL 2 MG/2ML IJ SOLN
INTRAMUSCULAR | Status: DC | PRN
  Administered 2017-08-11: 2 mg via INTRAVENOUS

## 2017-08-11 MED ORDER — MIDAZOLAM HCL 2 MG/2ML IJ SOLN
INTRAMUSCULAR | Status: AC
  Filled 2017-08-11: qty 2

## 2017-08-11 SURGICAL SUPPLY — 16 items
GLOVE BIO SURGEON STRL SZ8 (GLOVE) ×3 IMPLANT
GLOVE BIOGEL M 6.5 STRL (GLOVE) ×3 IMPLANT
GLOVE SURG LX 8.0 MICRO (GLOVE) ×2
GLOVE SURG LX STRL 8.0 MICRO (GLOVE) ×1 IMPLANT
GOWN STRL REUS W/ TWL LRG LVL3 (GOWN DISPOSABLE) ×2 IMPLANT
GOWN STRL REUS W/TWL LRG LVL3 (GOWN DISPOSABLE) ×4
LABEL CATARACT MEDS ST (LABEL) ×3 IMPLANT
LENS IOL TECNIS ITEC 14.5 (Intraocular Lens) ×3 IMPLANT
PACK CATARACT (MISCELLANEOUS) ×3 IMPLANT
PACK CATARACT BRASINGTON LX (MISCELLANEOUS) ×3 IMPLANT
PACK EYE AFTER SURG (MISCELLANEOUS) ×3 IMPLANT
SOL BSS BAG (MISCELLANEOUS) ×3
SOLUTION BSS BAG (MISCELLANEOUS) ×1 IMPLANT
SYR 5ML LL (SYRINGE) ×3 IMPLANT
WATER STERILE IRR 250ML POUR (IV SOLUTION) ×3 IMPLANT
WIPE NON LINTING 3.25X3.25 (MISCELLANEOUS) ×3 IMPLANT

## 2017-08-11 NOTE — Anesthesia Preprocedure Evaluation (Signed)
Anesthesia Evaluation  Patient identified by MRN, date of birth, ID band Patient awake    Reviewed: Allergy & Precautions, NPO status , Patient's Chart, lab work & pertinent test results  Airway Mallampati: II       Dental  (+) Teeth Intact   Pulmonary former smoker,     + decreased breath sounds      Cardiovascular hypertension,  Rhythm:Regular     Neuro/Psych Anxiety Depression    GI/Hepatic Neg liver ROS, GERD  Medicated,  Endo/Other  negative endocrine ROS  Renal/GU negative Renal ROS  negative genitourinary   Musculoskeletal   Abdominal (+) + obese,   Peds negative pediatric ROS (+)  Hematology  (+) anemia ,   Anesthesia Other Findings   Reproductive/Obstetrics                             Anesthesia Physical Anesthesia Plan  ASA: II  Anesthesia Plan: MAC   Post-op Pain Management:    Induction: Intravenous  PONV Risk Score and Plan: 0  Airway Management Planned: Natural Airway and Nasal Cannula  Additional Equipment:   Intra-op Plan:   Post-operative Plan:   Informed Consent: I have reviewed the patients History and Physical, chart, labs and discussed the procedure including the risks, benefits and alternatives for the proposed anesthesia with the patient or authorized representative who has indicated his/her understanding and acceptance.     Plan Discussed with: CRNA  Anesthesia Plan Comments:         Anesthesia Quick Evaluation

## 2017-08-11 NOTE — Anesthesia Postprocedure Evaluation (Signed)
Anesthesia Post Note  Patient: Heather Blake  Procedure(s) Performed: CATARACT EXTRACTION PHACO AND INTRAOCULAR LENS PLACEMENT (IOC) (Left Eye)  Anesthesia Type: MAC     Last Vitals:  Vitals:   08/11/17 1115 08/11/17 1138  BP: 131/73 116/75  Pulse: 66 65  Resp: 16   Temp: (!) 36.4 C   SpO2: 98%     Last Pain:  Vitals:   08/11/17 1115  TempSrc: Temporal                 Buckner Malta

## 2017-08-11 NOTE — Discharge Instructions (Signed)
Eye Surgery Discharge Instructions  Expect mild scratchy sensation or mild soreness. DO NOT RUB YOUR EYE!  The day of surgery:  Minimal physical activity, but bed rest is not required  No reading, computer work, or close hand work  No bending, lifting, or straining.  May watch TV  For 24 hours:  No driving, legal decisions, or alcoholic beverages  Safety precautions  Eat anything you prefer: It is better to start with liquids, then soup then solid foods.  _____ Eye patch should be worn until postoperative exam tomorrow.  __x__ Solar shield eyeglasses should be worn for comfort in the sunlight/patch while sleeping  Resume all regular medications including aspirin or Coumadin if these were discontinued prior to surgery. You may shower, bathe, shave, or wash your hair. Tylenol may be taken for mild discomfort.  Call your doctor if you experience significant pain, nausea, or vomiting, fever > 101 or other signs of infection. [phone] or [phone] Specific instructions:  Follow-up Information    Birder Robson, MD Follow up on 08/12/2017.   Specialty:  Ophthalmology Why:  Follow up appointment at 1:20 pm in the office. Contact information: [place] [address] [phone]

## 2017-08-11 NOTE — H&P (Signed)
All labs reviewed. Abnormal studies sent to patients PCP when indicated.  Previous H&P reviewed, patient examined, there are NO CHANGES.  Heather Blake LOUIS10/30/201810:49 AM

## 2017-08-11 NOTE — Anesthesia Postprocedure Evaluation (Signed)
Anesthesia Post Note  Patient: Heather Blake  Procedure(s) Performed: CATARACT EXTRACTION PHACO AND INTRAOCULAR LENS PLACEMENT (IOC) (Left Eye)  Patient location during evaluation: PACU Anesthesia Type: MAC Level of consciousness: awake, awake and alert and oriented Pain management: pain level controlled Vital Signs Assessment: post-procedure vital signs reviewed and stable Respiratory status: spontaneous breathing, nonlabored ventilation and respiratory function stable Cardiovascular status: stable Anesthetic complications: no     Last Vitals:  Vitals:   08/11/17 0930 08/11/17 1115  BP: (!) 126/109 131/73  Pulse: 79   Resp: 20 14  Temp: 36.4 C   SpO2: 98% 99%    Last Pain:  Vitals:   08/11/17 0930  TempSrc: Oral                 [employer]

## 2017-08-11 NOTE — Transfer of Care (Signed)
Immediate Anesthesia Transfer of Care Note  Patient: Heather Blake  Procedure(s) Performed: CATARACT EXTRACTION PHACO AND INTRAOCULAR LENS PLACEMENT (IOC) (Left Eye)  Patient Location: PACU  Anesthesia Type:MAC  Level of Consciousness: awake, alert  and oriented  Airway & Oxygen Therapy: Patient Spontanous Breathing  Post-op Assessment: Report given to RN and Post -op Vital signs reviewed and stable  Post vital signs: Reviewed and stable  Last Vitals:  Vitals:   08/11/17 0930 08/11/17 1115  BP: (!) 126/109 131/73  Pulse: 79   Resp: 20 14  Temp: 36.4 C   SpO2: 98% 99%    Last Pain:  Vitals:   08/11/17 0930  TempSrc: Oral         Complications: No apparent anesthesia complications

## 2017-08-11 NOTE — Anesthesia Post-op Follow-up Note (Signed)
Anesthesia QCDR form completed.        

## 2017-08-11 NOTE — Anesthesia Procedure Notes (Signed)
Procedure Name: MAC Performed by: Lance Muss Pre-anesthesia Checklist: Patient identified, Emergency Drugs available, Suction available, Patient being monitored and Timeout performed Oxygen Delivery Method: Nasal cannula

## 2017-08-11 NOTE — Op Note (Signed)
PREOPERATIVE DIAGNOSIS:  Nuclear sclerotic cataract of the left eye.   POSTOPERATIVE DIAGNOSIS:  Nuclear sclerotic cataract of the left eye.   OPERATIVE PROCEDURE: Procedure(s): CATARACT EXTRACTION PHACO AND INTRAOCULAR LENS PLACEMENT (IOC)   SURGEON:  Birder Robson, MD.   ANESTHESIA:  Anesthesiologist: [facility], Jane Canary, MD CRNA: Lance Muss, CRNA  1.      Managed anesthesia care. 2.     0.8ml of Shugarcaine was instilled following the paracentesis   COMPLICATIONS:  None.   TECHNIQUE:   Stop and chop   DESCRIPTION OF PROCEDURE:  The patient was examined and consented in the preoperative holding area where the aforementioned topical anesthesia was applied to the left eye and then brought back to the Operating Room where the left eye was prepped and draped in the usual sterile ophthalmic fashion and a lid speculum was placed. A paracentesis was created with the side port blade and the anterior chamber was filled with viscoelastic. A near clear corneal incision was performed with the steel keratome. A continuous curvilinear capsulorrhexis was performed with a cystotome followed by the capsulorrhexis forceps. Hydrodissection and hydrodelineation were carried out with BSS on a blunt cannula. The lens was removed in a stop and chop  technique and the remaining cortical material was removed with the irrigation-aspiration handpiece. The capsular bag was inflated with viscoelastic and the Technis ZCB00 lens was placed in the capsular bag without complication. The remaining viscoelastic was removed from the eye with the irrigation-aspiration handpiece. The wounds were hydrated. The anterior chamber was flushed with Miostat and the eye was inflated to physiologic pressure. 0.17ml Vigamox was placed in the anterior chamber. The wounds were found to be water tight. The eye was dressed with Vigamox. The patient was given protective glasses to wear throughout the day and a shield with which to  sleep tonight. The patient was also given drops with which to begin a drop regimen today and will follow-up with me in one day.  Implant Name Type Inv. Item Serial No. Manufacturer Lot No. LRB No. Used  LENS IOL DIOP 14.5 - [id number] Intraocular Lens LENS IOL DIOP 14.5 [id number] AMO   Left 1    Procedure(s) with comments: CATARACT EXTRACTION PHACO AND INTRAOCULAR LENS PLACEMENT (IOC) (Left) - [place] 00:31 AP% 19.2 CDE 6.13 Fluid Pack lot # [id number]  Electronically signed: Deuce Paternoster LOUIS 08/11/2017 11:14 AM

## 2017-08-12 ENCOUNTER — Encounter: Payer: Self-pay | Admitting: Ophthalmology

## 2017-08-18 ENCOUNTER — Other Ambulatory Visit: Payer: Self-pay | Admitting: Internal Medicine

## 2017-08-22 ENCOUNTER — Other Ambulatory Visit: Payer: Self-pay | Admitting: Internal Medicine

## 2017-08-27 ENCOUNTER — Encounter: Payer: Self-pay | Admitting: *Deleted

## 2017-09-01 ENCOUNTER — Encounter
Admission: RE | Disposition: A | Discharge status: Home / Self Care | Payer: Self-pay | Source: Ambulatory Visit | Attending: Ophthalmology

## 2017-09-01 ENCOUNTER — Encounter: Payer: Self-pay | Admitting: *Deleted

## 2017-09-01 ENCOUNTER — Ambulatory Visit: Payer: Managed Care, Other (non HMO) | Admitting: Anesthesiology

## 2017-09-01 ENCOUNTER — Ambulatory Visit
Admission: RE | Admit: 2017-09-01 | Discharge: 2017-09-01 | Disposition: A | Discharge status: Home / Self Care | Payer: Managed Care, Other (non HMO) | Source: Ambulatory Visit | Attending: Ophthalmology | Admitting: Ophthalmology

## 2017-09-01 DIAGNOSIS — I10 Essential (primary) hypertension: Secondary | ICD-10-CM | POA: Insufficient documentation

## 2017-09-01 DIAGNOSIS — Z885 Allergy status to narcotic agent status: Secondary | ICD-10-CM | POA: Diagnosis not present

## 2017-09-01 DIAGNOSIS — F410 Panic disorder [episodic paroxysmal anxiety] without agoraphobia: Secondary | ICD-10-CM | POA: Insufficient documentation

## 2017-09-01 DIAGNOSIS — Z888 Allergy status to other drugs, medicaments and biological substances status: Secondary | ICD-10-CM | POA: Diagnosis not present

## 2017-09-01 DIAGNOSIS — M1990 Unspecified osteoarthritis, unspecified site: Secondary | ICD-10-CM | POA: Diagnosis not present

## 2017-09-01 DIAGNOSIS — F419 Anxiety disorder, unspecified: Secondary | ICD-10-CM | POA: Diagnosis not present

## 2017-09-01 DIAGNOSIS — Z886 Allergy status to analgesic agent status: Secondary | ICD-10-CM | POA: Diagnosis not present

## 2017-09-01 DIAGNOSIS — Z79899 Other long term (current) drug therapy: Secondary | ICD-10-CM | POA: Diagnosis not present

## 2017-09-01 DIAGNOSIS — H2511 Age-related nuclear cataract, right eye: Secondary | ICD-10-CM | POA: Diagnosis not present

## 2017-09-01 DIAGNOSIS — Z8719 Personal history of other diseases of the digestive system: Secondary | ICD-10-CM | POA: Insufficient documentation

## 2017-09-01 DIAGNOSIS — F329 Major depressive disorder, single episode, unspecified: Secondary | ICD-10-CM | POA: Diagnosis not present

## 2017-09-01 DIAGNOSIS — Z882 Allergy status to sulfonamides status: Secondary | ICD-10-CM | POA: Insufficient documentation

## 2017-09-01 DIAGNOSIS — K219 Gastro-esophageal reflux disease without esophagitis: Secondary | ICD-10-CM | POA: Diagnosis not present

## 2017-09-01 DIAGNOSIS — E669 Obesity, unspecified: Secondary | ICD-10-CM | POA: Insufficient documentation

## 2017-09-01 DIAGNOSIS — Z881 Allergy status to other antibiotic agents status: Secondary | ICD-10-CM | POA: Insufficient documentation

## 2017-09-01 DIAGNOSIS — E7800 Pure hypercholesterolemia, unspecified: Secondary | ICD-10-CM | POA: Insufficient documentation

## 2017-09-01 DIAGNOSIS — Z87891 Personal history of nicotine dependence: Secondary | ICD-10-CM | POA: Diagnosis not present

## 2017-09-01 HISTORY — PX: CATARACT EXTRACTION W/PHACO: SHX586

## 2017-09-01 SURGERY — PHACOEMULSIFICATION, CATARACT, WITH IOL INSERTION
Anesthesia: Monitor Anesthesia Care | Site: Eye | Laterality: Right | Wound class: Clean

## 2017-09-01 MED ORDER — EPINEPHRINE PF 1 MG/ML IJ SOLN
INTRAOCULAR | Status: DC | PRN
  Administered 2017-09-01: 1 mL via OPHTHALMIC

## 2017-09-01 MED ORDER — MOXIFLOXACIN HCL 0.5 % OP SOLN: 1.0000 [drp] | OPHTHALMIC | Status: DC | PRN

## 2017-09-01 MED ORDER — FENTANYL CITRATE (PF) 100 MCG/2ML IJ SOLN
INTRAMUSCULAR | Status: AC
  Filled 2017-09-01: qty 2

## 2017-09-01 MED ORDER — FENTANYL CITRATE (PF) 100 MCG/2ML IJ SOLN
INTRAMUSCULAR | Status: DC | PRN
  Administered 2017-09-01: 100 ug via INTRAVENOUS

## 2017-09-01 MED ORDER — MOXIFLOXACIN HCL 0.5 % OP SOLN
OPHTHALMIC | Status: DC | PRN
  Administered 2017-09-01: .2 mL via OPHTHALMIC

## 2017-09-01 MED ORDER — MIDAZOLAM HCL 2 MG/2ML IJ SOLN
INTRAMUSCULAR | Status: DC | PRN
  Administered 2017-09-01: 2 mg via INTRAVENOUS

## 2017-09-01 MED ORDER — ARMC OPHTHALMIC DILATING DROPS
1.0000 "application " | OPHTHALMIC | Status: AC
  Administered 2017-09-01 (×2): 1 via OPHTHALMIC

## 2017-09-01 MED ORDER — LIDOCAINE HCL (PF) 4 % IJ SOLN
INTRAMUSCULAR | Status: DC | PRN
  Administered 2017-09-01: 2 mL via OPHTHALMIC

## 2017-09-01 MED ORDER — MOXIFLOXACIN HCL 0.5 % OP SOLN
OPHTHALMIC | Status: AC
  Filled 2017-09-01: qty 3

## 2017-09-01 MED ORDER — NA CHONDROIT SULF-NA HYALURON 40-17 MG/ML IO SOLN
INTRAOCULAR | Status: DC | PRN
  Administered 2017-09-01: 1 mL via INTRAOCULAR

## 2017-09-01 MED ORDER — ARMC OPHTHALMIC DILATING DROPS
OPHTHALMIC | Status: AC
  Filled 2017-09-01: qty 0.4

## 2017-09-01 MED ORDER — CARBACHOL 0.01 % IO SOLN
INTRAOCULAR | Status: DC | PRN
  Administered 2017-09-01: .5 mL via INTRAOCULAR

## 2017-09-01 MED ORDER — MIDAZOLAM HCL 2 MG/2ML IJ SOLN
INTRAMUSCULAR | Status: AC
  Filled 2017-09-01: qty 2

## 2017-09-01 MED ORDER — SODIUM CHLORIDE 0.9 % IV SOLN
INTRAVENOUS | Status: DC
  Administered 2017-09-01: 09:00:00 via INTRAVENOUS

## 2017-09-01 MED ORDER — POVIDONE-IODINE 5 % OP SOLN
OPHTHALMIC | Status: DC | PRN
  Administered 2017-09-01: 1 via OPHTHALMIC

## 2017-09-01 SURGICAL SUPPLY — 16 items
GLOVE BIO SURGEON STRL SZ8 (GLOVE) ×3 IMPLANT
GLOVE BIOGEL M 6.5 STRL (GLOVE) ×3 IMPLANT
GLOVE SURG LX 8.0 MICRO (GLOVE) ×2
GLOVE SURG LX STRL 8.0 MICRO (GLOVE) ×1 IMPLANT
GOWN STRL REUS W/ TWL LRG LVL3 (GOWN DISPOSABLE) ×2 IMPLANT
GOWN STRL REUS W/TWL LRG LVL3 (GOWN DISPOSABLE) ×4
LABEL CATARACT MEDS ST (LABEL) ×3 IMPLANT
LENS IOL TECNIS ITEC 16.0 (Intraocular Lens) ×3 IMPLANT
PACK CATARACT (MISCELLANEOUS) ×3 IMPLANT
PACK CATARACT BRASINGTON LX (MISCELLANEOUS) ×3 IMPLANT
PACK EYE AFTER SURG (MISCELLANEOUS) ×3 IMPLANT
SOL BSS BAG (MISCELLANEOUS) ×3
SOLUTION BSS BAG (MISCELLANEOUS) ×1 IMPLANT
SYR 5ML LL (SYRINGE) ×3 IMPLANT
WATER STERILE IRR 250ML POUR (IV SOLUTION) ×3 IMPLANT
WIPE NON LINTING 3.25X3.25 (MISCELLANEOUS) ×3 IMPLANT

## 2017-09-01 NOTE — Discharge Instructions (Signed)
Eye Surgery Discharge Instructions  Expect mild scratchy sensation or mild soreness. DO NOT RUB YOUR EYE!  The day of surgery:  Minimal physical activity, but bed rest is not required  No reading, computer work, or close hand work  No bending, lifting, or straining.  May watch TV  For 24 hours:  No driving, legal decisions, or alcoholic beverages  Safety precautions  Eat anything you prefer: It is better to start with liquids, then soup then solid foods.  _____ Eye patch should be worn until postoperative exam tomorrow.  ____ Solar shield eyeglasses should be worn for comfort in the sunlight/patch while sleeping  Resume all regular medications including aspirin or Coumadin if these were discontinued prior to surgery. You may shower, bathe, shave, or wash your hair. Tylenol may be taken for mild discomfort.  Call your doctor if you experience significant pain, nausea, or vomiting, fever > 101 or other signs of infection. [phone] or [phone] Specific instructions:  Follow-up Information    Birder Robson, MD Follow up.   Specialty:  Ophthalmology Why:  November 21 at 10:35am Contact information: [place] [address] [phone]

## 2017-09-01 NOTE — Anesthesia Post-op Follow-up Note (Signed)
Anesthesia QCDR form completed.        

## 2017-09-01 NOTE — Anesthesia Postprocedure Evaluation (Signed)
Anesthesia Post Note  Patient: SHULAMIT DONOFRIO  Procedure(s) Performed: CATARACT EXTRACTION PHACO AND INTRAOCULAR LENS PLACEMENT ([place]) (Right Eye)  Patient location during evaluation: Short Stay Anesthesia Type: MAC Level of consciousness: awake and alert and oriented Pain management: pain level controlled Vital Signs Assessment: post-procedure vital signs reviewed and stable Respiratory status: spontaneous breathing Cardiovascular status: stable Postop Assessment: no headache, no apparent nausea or vomiting and adequate PO intake Anesthetic complications: no     Last Vitals:  Vitals:   09/01/17 1041 09/01/17 1048  BP: 130/72 126/79  Pulse: 82 76  Resp: 20   Temp:    SpO2: 97% 98%    Last Pain:  Vitals:   09/01/17 0902  TempSrc: Temporal                 Lanora Manis

## 2017-09-01 NOTE — H&P (Signed)
All labs reviewed. Abnormal studies sent to patients PCP when indicated.  Previous H&P reviewed, patient examined, there are NO CHANGES.  Heather Olivar LOUIS11/20/201810:14 AM

## 2017-09-01 NOTE — Op Note (Signed)
PREOPERATIVE DIAGNOSIS:  Nuclear sclerotic cataract of the right eye.   POSTOPERATIVE DIAGNOSIS:  nuclear sclerotic cataract right eye   OPERATIVE PROCEDURE: Procedure(s): CATARACT EXTRACTION PHACO AND INTRAOCULAR LENS PLACEMENT (IOC)   SURGEON:  Birder Robson, MD.   ANESTHESIA:  Anesthesiologist: Emmie Niemann, MD CRNA: Jonna Clark, CRNA  1.      Managed anesthesia care. 2.      0.53ml of Shugarcaine was instilled in the eye following the paracentesis.   COMPLICATIONS:  None.   TECHNIQUE:   Stop and chop   DESCRIPTION OF PROCEDURE:  The patient was examined and consented in the preoperative holding area where the aforementioned topical anesthesia was applied to the right eye and then brought back to the Operating Room where the right eye was prepped and draped in the usual sterile ophthalmic fashion and a lid speculum was placed. A paracentesis was created with the side port blade and the anterior chamber was filled with viscoelastic. A near clear corneal incision was performed with the steel keratome. A continuous curvilinear capsulorrhexis was performed with a cystotome followed by the capsulorrhexis forceps. Hydrodissection and hydrodelineation were carried out with BSS on a blunt cannula. The lens was removed in a stop and chop  technique and the remaining cortical material was removed with the irrigation-aspiration handpiece. The capsular bag was inflated with viscoelastic and the Technis ZCB00  lens was placed in the capsular bag without complication. The remaining viscoelastic was removed from the eye with the irrigation-aspiration handpiece. The wounds were hydrated. The anterior chamber was flushed with Miostat and the eye was inflated to physiologic pressure. 0.37ml of Vigamox was placed in the anterior chamber. The wounds were found to be water tight. The eye was dressed with Vigamox. The patient was given protective glasses to wear throughout the day and a shield with which  to sleep tonight. The patient was also given drops with which to begin a drop regimen today and will follow-up with me in one day. Implant Name Type Inv. Item Serial No. Manufacturer Lot No. LRB No. Used  LENS IOL DIOP 16.0 - [id number] Intraocular Lens LENS IOL DIOP 16.0 [id number] AMO  Right 1   Procedure(s) with comments: CATARACT EXTRACTION PHACO AND INTRAOCULAR LENS PLACEMENT (IOC) (Right) - [place] 00:30 AP% 11.7 CDE3.52 fluid pack lot #[id number]  Electronically signed: Neyla Gauntt LOUIS 09/01/2017 10:38 AM

## 2017-09-01 NOTE — Anesthesia Preprocedure Evaluation (Signed)
Anesthesia Evaluation  Patient identified by MRN, date of birth, ID band Patient awake    Reviewed: Allergy & Precautions, NPO status , Patient's Chart, lab work & pertinent test results  History of Anesthesia Complications Negative for: history of anesthetic complications  Airway Mallampati: III  TM Distance: >3 FB Neck ROM: Full    Dental no notable dental hx.    Pulmonary neg sleep apnea, neg COPD, former smoker,    breath sounds clear to auscultation- rhonchi (-) wheezing      Cardiovascular hypertension, (-) CAD, (-) Past MI and (-) Cardiac Stents  Rhythm:Regular Rate:Normal - Systolic murmurs and - Diastolic murmurs    Neuro/Psych PSYCHIATRIC DISORDERS Anxiety Depression negative neurological ROS     GI/Hepatic Neg liver ROS, GERD  ,  Endo/Other  negative endocrine ROSneg diabetes  Renal/GU negative Renal ROS     Musculoskeletal  (+) Arthritis ,   Abdominal (+) + obese,   Peds  Hematology  (+) anemia ,   Anesthesia Other Findings Past Medical History: No date: Allergy No date: Anemia No date: Depression No date: Diverticulosis No date: FHx: migraine headaches No date: GERD (gastroesophageal reflux disease) No date: History of chickenpox No date: Hypercholesterolemia No date: Hypertension No date: Osteoarthritis No date: Panic attacks   Reproductive/Obstetrics                             Anesthesia Physical Anesthesia Plan  ASA: II  Anesthesia Plan: MAC   Post-op Pain Management:    Induction: Intravenous  PONV Risk Score and Plan: 2 and Midazolam  Airway Management Planned: Natural Airway  Additional Equipment:   Intra-op Plan:   Post-operative Plan:   Informed Consent: I have reviewed the patients History and Physical, chart, labs and discussed the procedure including the risks, benefits and alternatives for the proposed anesthesia with the patient or  authorized representative who has indicated his/her understanding and acceptance.     Plan Discussed with: CRNA and Anesthesiologist  Anesthesia Plan Comments:         Anesthesia Quick Evaluation

## 2017-09-01 NOTE — Transfer of Care (Signed)
Immediate Anesthesia Transfer of Care Note  Patient: Heather Blake  Procedure(s) Performed: CATARACT EXTRACTION PHACO AND INTRAOCULAR LENS PLACEMENT (IOC) (Right Eye)  Patient Location: PACU and Short Stay  Anesthesia Type:MAC  Level of Consciousness: awake and alert   Airway & Oxygen Therapy: Patient Spontanous Breathing  Post-op Assessment: Report given to RN and Post -op Vital signs reviewed and stable  Post vital signs: Reviewed and stable  Last Vitals:  Vitals:   09/01/17 0902 09/01/17 1041  BP: 128/86 130/72  Pulse: 86 82  Resp: 16 20  Temp: 36.6 C   SpO2: 99% 97%    Last Pain:  Vitals:   09/01/17 0902  TempSrc: Temporal         Complications: No apparent anesthesia complications

## 2017-09-14 ENCOUNTER — Encounter: Payer: Self-pay | Admitting: Internal Medicine

## 2017-09-14 ENCOUNTER — Ambulatory Visit (INDEPENDENT_AMBULATORY_CARE_PROVIDER_SITE_OTHER): Payer: Managed Care, Other (non HMO) | Admitting: Internal Medicine

## 2017-09-14 VITALS — BP 132/86 | HR 87 | Temp 98.2°F | Resp 18 | Ht 67.0 in | Wt 261.5 lb

## 2017-09-14 DIAGNOSIS — R5383 Other fatigue: Secondary | ICD-10-CM

## 2017-09-14 DIAGNOSIS — I10 Essential (primary) hypertension: Secondary | ICD-10-CM

## 2017-09-14 DIAGNOSIS — F419 Anxiety disorder, unspecified: Secondary | ICD-10-CM | POA: Diagnosis not present

## 2017-09-14 DIAGNOSIS — E7800 Pure hypercholesterolemia, unspecified: Secondary | ICD-10-CM | POA: Diagnosis not present

## 2017-09-14 LAB — CBC WITH DIFFERENTIAL/PLATELET
Basophils Absolute: 0.1 10*3/uL (ref 0.0–0.1)
Basophils Relative: 0.8 % (ref 0.0–3.0)
EOS ABS: 0.2 10*3/uL (ref 0.0–0.7)
Eosinophils Relative: 2.7 % (ref 0.0–5.0)
HEMATOCRIT: 42.2 % (ref 36.0–46.0)
HEMOGLOBIN: 14.2 g/dL (ref 12.0–15.0)
LYMPHS PCT: 31.7 % (ref 12.0–46.0)
Lymphs Abs: 2.3 10*3/uL (ref 0.7–4.0)
MCHC: 33.6 g/dL (ref 30.0–36.0)
MCV: 94.9 fl (ref 78.0–100.0)
MONO ABS: 0.5 10*3/uL (ref 0.1–1.0)
Monocytes Relative: 7 % (ref 3.0–12.0)
Neutro Abs: 4.3 10*3/uL (ref 1.4–7.7)
Neutrophils Relative %: 57.8 % (ref 43.0–77.0)
Platelets: 228 10*3/uL (ref 150.0–400.0)
RBC: 4.45 Mil/uL (ref 3.87–5.11)
RDW: 13.9 % (ref 11.5–15.5)
WBC: 7.4 10*3/uL (ref 4.0–10.5)

## 2017-09-14 NOTE — Progress Notes (Signed)
[name], female   DOB: [birth year], 56 y.o.   MRN: [id number]   Subjective:    [name], female    DOB: 1961/08/27, 56 y.o.   MRN: [id number]  HPI  Patient here for a scheduled follow up.  She is s/p cataract surgery.  Did well with the surgery.  Vision improved.  Some decreased stress.  Feels better.  Blood pressure doing better.  On citalopram.  Feels this is helping.  Does not feel needs any further intervention.  No chest pain.  Saw Dr Saralyn Pilar.  Had myoview study which revealed mild inf ischemia.  See note.  Cath deferred.  Recommended f/u in 3 months.  (evaluated 08/10/17).  She reports breathing is stable.  No acid reflux.  No abdominal pain.  Bowels moving.     Past Medical History:  Diagnosis Date  . Allergy   . Anemia   . Depression   . Diverticulosis   . FHx: migraine headaches   . GERD (gastroesophageal reflux disease)   . History of chickenpox   . Hypercholesterolemia   . Hypertension   . Osteoarthritis   . Panic attacks    Past Surgical History:  Procedure Laterality Date  . ABDOMINAL HYSTERECTOMY    . CATARACT EXTRACTION W/PHACO Left 08/11/2017   Procedure: CATARACT EXTRACTION PHACO AND INTRAOCULAR LENS PLACEMENT (IOC);  Surgeon: Birder Robson, MD;  Location: [facility] ORS;  Service: Ophthalmology;  Laterality: Left;  [place] 00:31 AP% 19.2 CDE 6.13 Fluid Pack lot # [id number]  . CATARACT EXTRACTION W/PHACO Right 09/01/2017   Procedure: CATARACT EXTRACTION PHACO AND INTRAOCULAR LENS PLACEMENT ([place]);  Surgeon: Birder Robson, MD;  Location: [facility] ORS;  Service: Ophthalmology;  Laterality: Right;  [place] 00:30 AP% 11.7 CDE3.52 fluid pack lot #[id number]  . Child Birth  55 and 35  . FRACTURE SURGERY    . LAPAROSCOPIC SUPRACERVICAL HYSTERECTOMY  2006   ovaries left in place  . left elbow    . left knee    . Miscarriage  1987  . TONSILLECTOMY  1963  . TUBAL LIGATION  1993   Family History  Problem Relation Age of Onset  . Heart  disease Mother        Incomplete heart block  . Hypertension Mother   . Diabetes Mother   . Nephrolithiasis Mother   . Migraines Mother   . Arthritis Mother        degenerative-back,osteoporosis  . Heart disease Father 54       Myocardial infarction  . Heart disease Sister        h/o MI  . Migraines Sister    Social History   Socioeconomic History  . Marital status: Married    Spouse name: None  . Number of children: None  . Years of education: None  . Highest education level: None  Social Needs  . Financial resource strain: None  . Food insecurity - worry: None  . Food insecurity - inability: None  . Transportation needs - medical: None  . Transportation needs - non-medical: None  Occupational History  . None  Tobacco Use  . Smoking status: Former Research scientist (life sciences)  . Smokeless tobacco: Never Used  Substance and Sexual Activity  . Alcohol use: No    Frequency: Never  . Drug use: No  . Sexual activity: None  Other Topics Concern  . None  Social History Narrative  . None    Outpatient Encounter Medications as of 09/14/2017  Medication Sig  .  acetaminophen (TYLENOL) 650 MG CR tablet Take 650 mg by mouth every 8 (eight) hours as needed for pain.  Marland Kitchen atorvastatin (LIPITOR) 20 MG tablet TAKE 1 TABLET BY MOUTH DAILY  . citalopram (CELEXA) 20 MG tablet Take 20 mg daily by mouth.  . citalopram (CELEXA) 40 MG tablet Take 1 tablet (40 mg total) by mouth daily. (Patient taking differently: Take 20 mg by mouth daily. )  . DUREZOL 0.05 % EMUL Place 1 drop 2 (two) times daily into the left eye.  . ILEVRO 0.3 % ophthalmic suspension 1 drop 2 (two) times daily by Operative site/used throughout procedure route.  Marland Kitchen lisinopril (PRINIVIL,ZESTRIL) 40 MG tablet TAKE 1 TABLET(40 MG) BY MOUTH DAILY  . moxifloxacin (VIGAMOX) 0.5 % ophthalmic solution Place 1 drop 4 (four) times daily into the right eye.  . ranitidine (ZANTAC) 150 MG tablet Take 150 mg 2 (two) times daily by mouth.  . [DISCONTINUED]  amLODipine (NORVASC) 5 MG tablet Take 1 tablet (5 mg total) by mouth daily.   No facility-administered encounter medications on file as of 09/14/2017.     Review of Systems  Constitutional: Negative for appetite change and unexpected weight change.  HENT: Negative for congestion and sinus pressure.   Respiratory: Negative for cough and chest tightness.        Breathing stable.    Cardiovascular: Negative for chest pain, palpitations and leg swelling.  Gastrointestinal: Negative for abdominal pain, diarrhea, nausea and vomiting.  Genitourinary: Negative for difficulty urinating and dysuria.  Musculoskeletal: Negative for joint swelling and myalgias.  Skin: Negative for color change and rash.  Neurological: Negative for dizziness, light-headedness and headaches.  Psychiatric/Behavioral: Negative for agitation and dysphoric mood.       Objective:    Physical Exam  Constitutional: She appears well-developed and well-nourished. No distress.  HENT:  Nose: Nose normal.  Mouth/Throat: Oropharynx is clear and moist.  Neck: Neck supple. No thyromegaly present.  Cardiovascular: Normal rate and regular rhythm.  Pulmonary/Chest: Breath sounds normal. No respiratory distress. She has no wheezes.  Abdominal: Soft. Bowel sounds are normal. There is no tenderness.  Musculoskeletal: She exhibits no edema or tenderness.  Lymphadenopathy:    She has no cervical adenopathy.  Skin: No rash noted. No erythema.  Psychiatric: She has a normal mood and affect. Her behavior is normal.    BP 132/86   Pulse 87   Temp 98.2 F (36.8 C)   Resp 18   Ht 5\' 7"  (1.702 m)   Wt 261 lb 8 oz (118.6 kg)   SpO2 96%   BMI 40.96 kg/m  Wt Readings from Last 3 Encounters:  09/14/17 261 lb 8 oz (118.6 kg)  08/11/17 256 lb (116.1 kg)  07/22/17 259 lb 8 oz (117.7 kg)     Lab Results  Component Value Date   WBC 7.4 09/14/2017   HGB 14.2 09/14/2017   HCT 42.2 09/14/2017   PLT 228.0 09/14/2017   GLUCOSE 99  09/14/2017   CHOL 184 09/14/2017   TRIG 144.0 09/14/2017   HDL 54.10 09/14/2017   LDLCALC 101 (H) 09/14/2017   ALT 31 09/14/2017   AST 27 09/14/2017   NA 140 09/14/2017   K 4.5 09/14/2017   CL 102 09/14/2017   CREATININE 0.64 09/14/2017   BUN 14 09/14/2017   CO2 29 09/14/2017   TSH 0.61 05/22/2017       Assessment & Plan:   Problem List Items Addressed This Visit    Anxiety    Doing  better on current dose of citalopram.  Does not feel needs any further intervention.  Follow        Essential hypertension, benign - Primary    Blood pressure improved.  Continue current medication regimen.  Follow pressures.  Follow metabolic panel.        Relevant Orders   Basic metabolic panel (Completed)   Hypercholesterolemia    On lipitor.  Low cholesterol diet and exercise.  Follow lipid panel and liver function tests.        Relevant Orders   Hepatic function panel (Completed)   Lipid panel (Completed)    Other Visit Diagnoses    Other fatigue       Relevant Orders   CBC with Differential/Platelet (Completed)       Einar Pheasant, MD

## 2017-09-15 LAB — HEPATIC FUNCTION PANEL
ALT: 31 U/L (ref 0–35)
AST: 27 U/L (ref 0–37)
Albumin: 4.8 g/dL (ref 3.5–5.2)
Alkaline Phosphatase: 56 U/L (ref 39–117)
BILIRUBIN TOTAL: 0.6 mg/dL (ref 0.2–1.2)
Bilirubin, Direct: 0.1 mg/dL (ref 0.0–0.3)
Total Protein: 7.7 g/dL (ref 6.0–8.3)

## 2017-09-15 LAB — LIPID PANEL
CHOLESTEROL: 184 mg/dL (ref 0–200)
HDL: 54.1 mg/dL (ref >39.00)
LDL CALC: 101 mg/dL — AB (ref 0–99)
NonHDL: 130
Total CHOL/HDL Ratio: 3
Triglycerides: 144 mg/dL (ref 0.0–149.0)
VLDL: 28.8 mg/dL (ref 0.0–40.0)

## 2017-09-15 LAB — BASIC METABOLIC PANEL
BUN: 14 mg/dL (ref 6–23)
CHLORIDE: 102 meq/L (ref 96–112)
CO2: 29 mEq/L (ref 19–32)
CREATININE: 0.64 mg/dL (ref 0.40–1.20)
Calcium: 10 mg/dL (ref 8.4–10.5)
GFR: 101.12 mL/min (ref >60.00)
Glucose, Bld: 99 mg/dL (ref 70–99)
POTASSIUM: 4.5 meq/L (ref 3.5–5.1)
SODIUM: 140 meq/L (ref 135–145)

## 2017-09-16 ENCOUNTER — Encounter: Payer: Self-pay | Admitting: Internal Medicine

## 2017-09-16 ENCOUNTER — Other Ambulatory Visit: Payer: Self-pay | Admitting: Internal Medicine

## 2017-09-17 ENCOUNTER — Encounter: Payer: Self-pay | Admitting: Internal Medicine

## 2017-09-17 NOTE — Assessment & Plan Note (Signed)
Doing better on current dose of citalopram.  Does not feel needs any further intervention.  Follow

## 2017-09-17 NOTE — Assessment & Plan Note (Signed)
Blood pressure improved.  Continue current medication regimen.  Follow pressures.  Follow metabolic panel.

## 2017-09-17 NOTE — Assessment & Plan Note (Signed)
On lipitor.  Low cholesterol diet and exercise.  Follow lipid panel and liver function tests.   

## 2017-09-18 ENCOUNTER — Ambulatory Visit
Admission: RE | Admit: 2017-09-18 | Discharge: 2017-09-18 | Disposition: A | Discharge status: Home / Self Care | Payer: Managed Care, Other (non HMO) | Source: Ambulatory Visit | Attending: Internal Medicine | Admitting: Internal Medicine

## 2017-09-18 DIAGNOSIS — Z1231 Encounter for screening mammogram for malignant neoplasm of breast: Secondary | ICD-10-CM | POA: Diagnosis present

## 2017-09-18 DIAGNOSIS — Z1239 Encounter for other screening for malignant neoplasm of breast: Secondary | ICD-10-CM

## 2017-09-18 NOTE — Telephone Encounter (Signed)
Results notes mailed to patients home.

## 2017-09-19 ENCOUNTER — Encounter: Payer: Self-pay | Admitting: Internal Medicine

## 2017-09-19 DIAGNOSIS — Z0000 Encounter for general adult medical examination without abnormal findings: Secondary | ICD-10-CM | POA: Insufficient documentation

## 2017-10-15 ENCOUNTER — Other Ambulatory Visit: Payer: Self-pay | Admitting: Internal Medicine

## 2017-11-16 ENCOUNTER — Other Ambulatory Visit: Payer: Self-pay | Admitting: Internal Medicine

## 2017-11-19 ENCOUNTER — Other Ambulatory Visit: Payer: Self-pay | Admitting: Internal Medicine

## 2017-12-16 ENCOUNTER — Other Ambulatory Visit: Payer: Self-pay | Admitting: Internal Medicine

## 2017-12-17 ENCOUNTER — Ambulatory Visit (INDEPENDENT_AMBULATORY_CARE_PROVIDER_SITE_OTHER): Payer: Managed Care, Other (non HMO) | Admitting: Internal Medicine

## 2017-12-17 VITALS — BP 138/82 | HR 91 | Temp 98.5°F | Resp 18 | Wt 268.8 lb

## 2017-12-17 DIAGNOSIS — F419 Anxiety disorder, unspecified: Secondary | ICD-10-CM

## 2017-12-17 DIAGNOSIS — M25552 Pain in left hip: Secondary | ICD-10-CM | POA: Diagnosis not present

## 2017-12-17 DIAGNOSIS — Z0000 Encounter for general adult medical examination without abnormal findings: Secondary | ICD-10-CM | POA: Diagnosis not present

## 2017-12-17 DIAGNOSIS — R739 Hyperglycemia, unspecified: Secondary | ICD-10-CM

## 2017-12-17 DIAGNOSIS — E7800 Pure hypercholesterolemia, unspecified: Secondary | ICD-10-CM

## 2017-12-17 DIAGNOSIS — I10 Essential (primary) hypertension: Secondary | ICD-10-CM | POA: Diagnosis not present

## 2017-12-17 NOTE — Progress Notes (Signed)
[name], female   DOB: 19-Mar-1961, 57 y.o.   MRN: [id number]   Subjective:    [name], female    DOB: 1961/01/30, 57 y.o.   MRN: [id number]  HPI  Patient here for her physical exam.  She reports she is doing relatively well.  Has gained weight.  Discussed diet and exercise.  Discussed eating regular meals and low carb diet.  No chest pain or sob.  No acid reflux.  No abdominal pain.  Bowels moving.  No urine change.  Eyes doing well s/p surgery.  Seeing well.  Has noticed some pain in her left hip and left knee.  Taking occasional alleve.  Helps.  Desires no further intervention.    Past Medical History:  Diagnosis Date  . Allergy   . Anemia   . Depression   . Diverticulosis   . FHx: migraine headaches   . GERD (gastroesophageal reflux disease)   . History of chickenpox   . Hypercholesterolemia   . Hypertension   . Osteoarthritis   . Panic attacks    Past Surgical History:  Procedure Laterality Date  . ABDOMINAL HYSTERECTOMY    . CATARACT EXTRACTION W/PHACO Left 08/11/2017   Procedure: CATARACT EXTRACTION PHACO AND INTRAOCULAR LENS PLACEMENT (IOC);  Surgeon: Birder Robson, MD;  Location: [facility] ORS;  Service: Ophthalmology;  Laterality: Left;  [place] 00:31 AP% 19.2 CDE 6.13 Fluid Pack lot # [id number]  . CATARACT EXTRACTION W/PHACO Right 09/01/2017   Procedure: CATARACT EXTRACTION PHACO AND INTRAOCULAR LENS PLACEMENT ([place]);  Surgeon: Birder Robson, MD;  Location: [facility] ORS;  Service: Ophthalmology;  Laterality: Right;  [place] 00:30 AP% 11.7 CDE3.52 fluid pack lot #[id number]  . Child Birth  40 and 80  . FRACTURE SURGERY    . LAPAROSCOPIC SUPRACERVICAL HYSTERECTOMY  2006   ovaries left in place  . left elbow    . left knee    . Miscarriage  1987  . TONSILLECTOMY  1963  . TUBAL LIGATION  1993   Family History  Problem Relation Age of Onset  . Heart disease Mother        Incomplete heart block  . Hypertension Mother   . Diabetes  Mother   . Nephrolithiasis Mother   . Migraines Mother   . Arthritis Mother        degenerative-back,osteoporosis  . Heart disease Father 50       Myocardial infarction  . Heart disease Sister        h/o MI  . Migraines Sister    Social History   Socioeconomic History  . Marital status: Married    Spouse name: None  . Number of children: None  . Years of education: None  . Highest education level: None  Social Needs  . Financial resource strain: None  . Food insecurity - worry: None  . Food insecurity - inability: None  . Transportation needs - medical: None  . Transportation needs - non-medical: None  Occupational History  . None  Tobacco Use  . Smoking status: Former Research scientist (life sciences)  . Smokeless tobacco: Never Used  Substance and Sexual Activity  . Alcohol use: No    Frequency: Never  . Drug use: No  . Sexual activity: None  Other Topics Concern  . None  Social History Narrative  . None    Outpatient Encounter Medications as of 12/17/2017  Medication Sig  . acetaminophen (TYLENOL) 650 MG CR tablet Take 650 mg by mouth every 8 (eight) hours  as needed for pain.  Marland Kitchen amLODipine (NORVASC) 5 MG tablet TAKE 1 TABLET(5 MG) BY MOUTH DAILY  . atorvastatin (LIPITOR) 20 MG tablet TAKE 1 TABLET BY MOUTH DAILY  . atorvastatin (LIPITOR) 20 MG tablet TAKE 1 TABLET(20 MG) BY MOUTH DAILY  . citalopram (CELEXA) 20 MG tablet Take 20 mg daily by mouth.  . citalopram (CELEXA) 40 MG tablet TAKE 1 TABLET(40 MG) BY MOUTH DAILY  . DUREZOL 0.05 % EMUL Place 1 drop 2 (two) times daily into the left eye.  . ILEVRO 0.3 % ophthalmic suspension 1 drop 2 (two) times daily by Operative site/used throughout procedure route.  Marland Kitchen lisinopril (PRINIVIL,ZESTRIL) 40 MG tablet TAKE 1 TABLET(40 MG) BY MOUTH DAILY  . moxifloxacin (VIGAMOX) 0.5 % ophthalmic solution Place 1 drop 4 (four) times daily into the right eye.  . ranitidine (ZANTAC) 150 MG tablet Take 150 mg 2 (two) times daily by mouth.   No  facility-administered encounter medications on file as of 12/17/2017.     Review of Systems  Constitutional: Negative for appetite change and unexpected weight change.  HENT: Negative for congestion and sinus pressure.   Eyes: Negative for pain and visual disturbance.  Respiratory: Negative for cough, chest tightness and shortness of breath.   Cardiovascular: Negative for chest pain, palpitations and leg swelling.  Gastrointestinal: Negative for abdominal pain, diarrhea, nausea and vomiting.  Genitourinary: Negative for difficulty urinating and dysuria.  Musculoskeletal: Negative for joint swelling and myalgias.       Left hip and left knee pain as outlined.   Skin: Negative for color change and rash.  Neurological: Negative for dizziness, light-headedness and headaches.  Hematological: Negative for adenopathy. Does not bruise/bleed easily.  Psychiatric/Behavioral: Negative for agitation and dysphoric mood.       Objective:    Physical Exam  Constitutional: She is oriented to person, place, and time. She appears well-developed and well-nourished. No distress.  HENT:  Nose: Nose normal.  Mouth/Throat: Oropharynx is clear and moist.  Eyes: Right eye exhibits no discharge. Left eye exhibits no discharge. No scleral icterus.  Neck: Neck supple. No thyromegaly present.  Cardiovascular: Normal rate and regular rhythm.  Pulmonary/Chest: Breath sounds normal. No accessory muscle usage. No tachypnea. No respiratory distress. She has no decreased breath sounds. She has no wheezes. She has no rhonchi. Right breast exhibits no inverted nipple, no mass, no nipple discharge and no tenderness (no axillary adenopathy). Left breast exhibits no inverted nipple, no mass, no nipple discharge and no tenderness (no axilarry adenopathy).  Abdominal: Soft. Bowel sounds are normal. There is no tenderness.  Musculoskeletal: She exhibits no edema or tenderness.  Lymphadenopathy:    She has no cervical  adenopathy.  Neurological: She is alert and oriented to person, place, and time.  Skin: Skin is warm. No rash noted. No erythema.  Psychiatric: She has a normal mood and affect. Her behavior is normal.    BP 138/82 (BP Location: Right Arm, Patient Position: Sitting, Cuff Size: Normal)   Pulse 91   Temp 98.5 F (36.9 C) (Oral)   Resp 18   Wt 268 lb 12.8 oz (121.9 kg)   SpO2 98%   BMI 42.10 kg/m  Wt Readings from Last 3 Encounters:  12/17/17 268 lb 12.8 oz (121.9 kg)  09/14/17 261 lb 8 oz (118.6 kg)  08/11/17 256 lb (116.1 kg)     Lab Results  Component Value Date   WBC 7.4 09/14/2017   HGB 14.2 09/14/2017   HCT 42.2 09/14/2017  PLT 228.0 09/14/2017   GLUCOSE 96 12/17/2017   CHOL 206 (H) 12/17/2017   TRIG 157.0 (H) 12/17/2017   HDL 56.20 12/17/2017   LDLCALC 119 (H) 12/17/2017   ALT 44 (H) 12/17/2017   AST 52 (H) 12/17/2017   NA 139 12/17/2017   K 4.8 12/17/2017   CL 102 12/17/2017   CREATININE 0.62 12/17/2017   BUN 9 12/17/2017   CO2 28 12/17/2017   TSH 0.61 05/22/2017   HGBA1C 5.9 12/17/2017    Mm Digital Screening Bilateral  Result Date: 09/18/2017 CLINICAL DATA:  Screening. EXAM: DIGITAL SCREENING BILATERAL MAMMOGRAM WITH CAD COMPARISON:  Previous exam(s). ACR Breast Density Category a: The breast tissue is almost entirely fatty. FINDINGS: There are no findings suspicious for malignancy. Images were processed with CAD. IMPRESSION: No mammographic evidence of malignancy. A result letter of this screening mammogram will be mailed directly to the patient. RECOMMENDATION: Screening mammogram in one year. (Code:SM-B-01Y) BI-RADS CATEGORY  1: Negative. Electronically Signed   By: Lajean Manes M.D.   On: 09/18/2017 13:44       Assessment & Plan:   Problem List Items Addressed This Visit    Anxiety    Doing better.  On citalopram.  Follow.        Essential hypertension, benign    Blood pressure doing much better.  Continue same medication regimen.  Follow  pressures.  Follow metabolic panel.       Relevant Orders   Basic metabolic panel (Completed)   Healthcare maintenance    Physical today 12/17/17.  Mammogram 09/18/17 - Birads I.  Colonoscopy 09/2008.  Due this year.  Discussed with her.        Hypercholesterolemia    On lipitor.  Low cholesterol diet and exercise.  Follow lipid panel and liver function tests.        Relevant Orders   Hepatic function panel (Completed)   Lipid panel (Completed)    Other Visit Diagnoses    Routine general medical examination at a health care facility    -  Primary   Hyperglycemia       Relevant Orders   Hemoglobin A1c (Completed)   Left hip pain       Left hip and left knee pain as outlined.  desires no further intervention.  follow.        Einar Pheasant, MD

## 2017-12-17 NOTE — Assessment & Plan Note (Addendum)
Physical today 12/17/17.  Mammogram 09/18/17 - Birads I.  Colonoscopy 09/2008.  Due this year.  Discussed with her.

## 2017-12-18 LAB — HEPATIC FUNCTION PANEL
ALBUMIN: 4.7 g/dL (ref 3.5–5.2)
ALK PHOS: 49 U/L (ref 39–117)
ALT: 44 U/L — ABNORMAL HIGH (ref 0–35)
AST: 52 U/L — ABNORMAL HIGH (ref 0–37)
Bilirubin, Direct: 0.1 mg/dL (ref 0.0–0.3)
TOTAL PROTEIN: 7.9 g/dL (ref 6.0–8.3)
Total Bilirubin: 0.5 mg/dL (ref 0.2–1.2)

## 2017-12-18 LAB — LIPID PANEL
CHOLESTEROL: 206 mg/dL — AB (ref 0–200)
HDL: 56.2 mg/dL (ref >39.00)
LDL Cholesterol: 119 mg/dL — ABNORMAL HIGH (ref 0–99)
NONHDL: 150.19
Total CHOL/HDL Ratio: 4
Triglycerides: 157 mg/dL — ABNORMAL HIGH (ref 0.0–149.0)
VLDL: 31.4 mg/dL (ref 0.0–40.0)

## 2017-12-18 LAB — BASIC METABOLIC PANEL
BUN: 9 mg/dL (ref 6–23)
CHLORIDE: 102 meq/L (ref 96–112)
CO2: 28 meq/L (ref 19–32)
Calcium: 10 mg/dL (ref 8.4–10.5)
Creatinine, Ser: 0.62 mg/dL (ref 0.40–1.20)
GFR: 104.8 mL/min (ref >60.00)
GLUCOSE: 96 mg/dL (ref 70–99)
Potassium: 4.8 mEq/L (ref 3.5–5.1)
Sodium: 139 mEq/L (ref 135–145)

## 2017-12-18 LAB — HEMOGLOBIN A1C: HEMOGLOBIN A1C: 5.9 % (ref 4.6–6.5)

## 2017-12-20 ENCOUNTER — Encounter: Payer: Self-pay | Admitting: Internal Medicine

## 2017-12-20 NOTE — Assessment & Plan Note (Signed)
Doing better.  On citalopram.  Follow.   

## 2017-12-20 NOTE — Assessment & Plan Note (Signed)
On lipitor.  Low cholesterol diet and exercise.  Follow lipid panel and liver function tests.   

## 2017-12-20 NOTE — Assessment & Plan Note (Signed)
Blood pressure doing much better.  Continue same medication regimen.  Follow pressures.  Follow metabolic panel.

## 2018-02-18 ENCOUNTER — Other Ambulatory Visit: Payer: Self-pay | Admitting: Internal Medicine

## 2018-04-14 ENCOUNTER — Other Ambulatory Visit: Payer: Self-pay | Admitting: Internal Medicine

## 2018-04-22 ENCOUNTER — Encounter: Payer: Self-pay | Admitting: Internal Medicine

## 2018-04-22 ENCOUNTER — Other Ambulatory Visit: Payer: Self-pay

## 2018-04-22 ENCOUNTER — Ambulatory Visit (INDEPENDENT_AMBULATORY_CARE_PROVIDER_SITE_OTHER): Payer: Managed Care, Other (non HMO) | Admitting: Internal Medicine

## 2018-04-22 VITALS — BP 138/86 | HR 90 | Wt 278.0 lb

## 2018-04-22 DIAGNOSIS — E7800 Pure hypercholesterolemia, unspecified: Secondary | ICD-10-CM | POA: Diagnosis not present

## 2018-04-22 DIAGNOSIS — I10 Essential (primary) hypertension: Secondary | ICD-10-CM | POA: Diagnosis not present

## 2018-04-22 DIAGNOSIS — K219 Gastro-esophageal reflux disease without esophagitis: Secondary | ICD-10-CM | POA: Diagnosis not present

## 2018-04-22 DIAGNOSIS — K5790 Diverticulosis of intestine, part unspecified, without perforation or abscess without bleeding: Secondary | ICD-10-CM

## 2018-04-22 DIAGNOSIS — F419 Anxiety disorder, unspecified: Secondary | ICD-10-CM

## 2018-04-22 DIAGNOSIS — R739 Hyperglycemia, unspecified: Secondary | ICD-10-CM | POA: Diagnosis not present

## 2018-04-22 MED ORDER — AMLODIPINE BESYLATE 10 MG PO TABS: 10.0000 mg | ORAL_TABLET | Freq: Every day | ORAL | 5 refills | Status: DC

## 2018-04-22 MED ORDER — CITALOPRAM HYDROBROMIDE 40 MG PO TABS: ORAL_TABLET | ORAL | 0 refills | Status: DC

## 2018-04-22 MED ORDER — PANTOPRAZOLE SODIUM 40 MG PO TBEC: 40.0000 mg | DELAYED_RELEASE_TABLET | Freq: Every day | ORAL | 2 refills | Status: DC

## 2018-04-22 NOTE — Progress Notes (Signed)
Subjective:    [name], female    DOB: 05/13/1961, 57 y.o.   MRN: [id number]  HPI  Patient here for a scheduled followup.  She reports she is doing relatively well.  Handling stress.  Trying to stay active.  No chest pain.  No sob.  Does report some acid reflux.  On zantac.  Feels is not helping.  Taking some of her husband's prilosec.  Wants rx for this.  Has gained weight.  Discussed diet and exercise.  No abdominal pain.  Bowels moving.  Blood pressures averaging 140-150s/90-100s.  Discussed weight loss and abdominal ultrasound.  She declines.     Past Medical History:  Diagnosis Date  . Allergy   . Anemia   . Depression   . Diverticulosis   . FHx: migraine headaches   . GERD (gastroesophageal reflux disease)   . History of chickenpox   . Hypercholesterolemia   . Hypertension   . Osteoarthritis   . Panic attacks    Past Surgical History:  Procedure Laterality Date  . ABDOMINAL HYSTERECTOMY    . CATARACT EXTRACTION W/PHACO Left 08/11/2017   Procedure: CATARACT EXTRACTION PHACO AND INTRAOCULAR LENS PLACEMENT (IOC);  Surgeon: Birder Robson, MD;  Location: [facility] ORS;  Service: Ophthalmology;  Laterality: Left;  [place] 00:31 AP% 19.2 CDE 6.13 Fluid Pack lot # [id number]  . CATARACT EXTRACTION W/PHACO Right 09/01/2017   Procedure: CATARACT EXTRACTION PHACO AND INTRAOCULAR LENS PLACEMENT ([place]);  Surgeon: Birder Robson, MD;  Location: [facility] ORS;  Service: Ophthalmology;  Laterality: Right;  [place] 00:30 AP% 11.7 CDE3.52 fluid pack lot #[id number]  . Child Birth  35 and 63  . FRACTURE SURGERY    . LAPAROSCOPIC SUPRACERVICAL HYSTERECTOMY  2006   ovaries left in place  . left elbow    . left knee    . Miscarriage  1987  . TONSILLECTOMY  1963  . TUBAL LIGATION  1993   Family History  Problem Relation Age of Onset  . Heart disease Mother        Incomplete heart block  . Hypertension Mother   . Diabetes Mother   . Nephrolithiasis Mother   . Migraines  Mother   . Arthritis Mother        degenerative-back,osteoporosis  . Heart disease Father 44       Myocardial infarction  . Heart disease Sister        h/o MI  . Migraines Sister    Social History   Socioeconomic History  . Marital status: Married    Spouse name: Not on file  . Number of children: Not on file  . Years of education: Not on file  . Highest education level: Not on file  Occupational History  . Not on file  Social Needs  . Financial resource strain: Not on file  . Food insecurity:    Worry: Not on file    Inability: Not on file  . Transportation needs:    Medical: Not on file    Non-medical: Not on file  Tobacco Use  . Smoking status: Former Research scientist (life sciences)  . Smokeless tobacco: Never Used  Substance and Sexual Activity  . Alcohol use: No    Frequency: Never  . Drug use: No  . Sexual activity: Not on file  Lifestyle  . Physical activity:    Days per week: Not on file    Minutes per session: Not on file  . Stress: Not on file  Relationships  . Social connections:  Talks on phone: Not on file    Gets together: Not on file    Attends religious service: Not on file    Active member of club or organization: Not on file    Attends meetings of clubs or organizations: Not on file    Relationship status: Not on file  Other Topics Concern  . Not on file  Social History Narrative  . Not on file    Outpatient Encounter Medications as of 04/22/2018  Medication Sig  . acetaminophen (TYLENOL) 650 MG CR tablet Take 650 mg by mouth every 8 (eight) hours as needed for pain.  Marland Kitchen atorvastatin (LIPITOR) 20 MG tablet TAKE 1 TABLET BY MOUTH DAILY  . DUREZOL 0.05 % EMUL Place 1 drop 2 (two) times daily into the left eye.  Marland Kitchen lisinopril (PRINIVIL,ZESTRIL) 40 MG tablet TAKE 1 TABLET(40 MG) BY MOUTH DAILY  . [DISCONTINUED] amLODipine (NORVASC) 5 MG tablet TAKE 1 TABLET(5 MG) BY MOUTH DAILY  . [DISCONTINUED] atorvastatin (LIPITOR) 20 MG tablet TAKE 1 TABLET(20 MG) BY MOUTH DAILY    . [DISCONTINUED] citalopram (CELEXA) 40 MG tablet TAKE 1 TABLET(40 MG) BY MOUTH DAILY  . amLODipine (NORVASC) 10 MG tablet Take 1 tablet (10 mg total) by mouth daily.  . ILEVRO 0.3 % ophthalmic suspension 1 drop 2 (two) times daily by Operative site/used throughout procedure route.  . moxifloxacin (VIGAMOX) 0.5 % ophthalmic solution Place 1 drop 4 (four) times daily into the right eye.  . pantoprazole (PROTONIX) 40 MG tablet Take 1 tablet (40 mg total) by mouth daily.  . ranitidine (ZANTAC) 150 MG tablet Take 150 mg 2 (two) times daily by mouth.  . [DISCONTINUED] citalopram (CELEXA) 20 MG tablet Take 20 mg daily by mouth.   No facility-administered encounter medications on file as of 04/22/2018.     Review of Systems  Constitutional: Negative for appetite change and unexpected weight change.  HENT: Negative for congestion and sinus pressure.   Respiratory: Negative for cough, chest tightness and shortness of breath.   Cardiovascular: Negative for chest pain, palpitations and leg swelling.  Gastrointestinal: Negative for abdominal pain, diarrhea, nausea and vomiting.  Genitourinary: Negative for difficulty urinating and dysuria.  Musculoskeletal: Negative for joint swelling and myalgias.  Skin: Negative for color change and rash.  Neurological: Negative for dizziness, light-headedness and headaches.  Psychiatric/Behavioral: Negative for agitation and dysphoric mood.       Objective:     Blood pressure rechecked by me:  138/86  Physical Exam  Constitutional: She appears well-developed and well-nourished. No distress.  HENT:  Nose: Nose normal.  Mouth/Throat: Oropharynx is clear and moist.  Neck: Neck supple. No thyromegaly present.  Cardiovascular: Normal rate and regular rhythm.  Pulmonary/Chest: Breath sounds normal. No respiratory distress. She has no wheezes.  Abdominal: Soft. Bowel sounds are normal. There is no tenderness.  Musculoskeletal: She exhibits no edema or  tenderness.  Lymphadenopathy:    She has no cervical adenopathy.  Skin: No rash noted. No erythema.  Psychiatric: She has a normal mood and affect. Her behavior is normal.    BP 138/86   Pulse 90   Wt 278 lb (126.1 kg)   SpO2 97%   BMI 43.54 kg/m  Wt Readings from Last 3 Encounters:  04/22/18 278 lb (126.1 kg)  12/17/17 268 lb 12.8 oz (121.9 kg)  09/14/17 261 lb 8 oz (118.6 kg)     Lab Results  Component Value Date   WBC 7.4 09/14/2017   HGB 14.2 09/14/2017  HCT 42.2 09/14/2017   PLT 228.0 09/14/2017   GLUCOSE 96 12/17/2017   CHOL 206 (H) 12/17/2017   TRIG 157.0 (H) 12/17/2017   HDL 56.20 12/17/2017   LDLCALC 119 (H) 12/17/2017   ALT 44 (H) 12/17/2017   AST 52 (H) 12/17/2017   NA 139 12/17/2017   K 4.8 12/17/2017   CL 102 12/17/2017   CREATININE 0.62 12/17/2017   BUN 9 12/17/2017   CO2 28 12/17/2017   TSH 0.61 05/22/2017   HGBA1C 5.9 12/17/2017    Mm Digital Screening Bilateral  Result Date: 09/18/2017 CLINICAL DATA:  Screening. EXAM: DIGITAL SCREENING BILATERAL MAMMOGRAM WITH CAD COMPARISON:  Previous exam(s). ACR Breast Density Category a: The breast tissue is almost entirely fatty. FINDINGS: There are no findings suspicious for malignancy. Images were processed with CAD. IMPRESSION: No mammographic evidence of malignancy. A result letter of this screening mammogram will be mailed directly to the patient. RECOMMENDATION: Screening mammogram in one year. (Code:SM-B-01Y) BI-RADS CATEGORY  1: Negative. Electronically Signed   By: Lajean Manes M.D.   On: 09/18/2017 13:44       Assessment & Plan:   Problem List Items Addressed This Visit    Anxiety    Doing better.  On citalopram.  Follow.        Diverticulosis    Colonoscopy 09/2008 - internal hemorrhoids and diverticulosis.  Currently asymptomatic.  Recommended f/u in 2019.  Refer to GI for f/u colonoscopy.  Will also refer her for persistent acid reflux.        Relevant Orders   Ambulatory referral to  Gastroenterology   Essential hypertension, benign    Blood pressure still elevated.  Increase amlodipine to 10mg  q day.  Follow pressures. Follow metabolic panel.        Relevant Medications   amLODipine (NORVASC) 10 MG tablet   Other Relevant Orders   TSH   Basic metabolic panel   GERD (gastroesophageal reflux disease)    On zantac.  Increased acid reflux despite taking zantac.  Will start protonix daily.  Follow.  Try to avoid eating late, etc.        Relevant Medications   pantoprazole (PROTONIX) 40 MG tablet   Other Relevant Orders   Ambulatory referral to Gastroenterology   Hypercholesterolemia    On lipitor.  Low cholesterol diet and exercise.  Follow lipid panel and liver function tests.        Relevant Medications   amLODipine (NORVASC) 10 MG tablet   Other Relevant Orders   Hepatic function panel   Lipid panel    Other Visit Diagnoses    Hyperglycemia    -  Primary   Relevant Orders   Hemoglobin A1c       Einar Pheasant, MD

## 2018-04-25 ENCOUNTER — Encounter: Payer: Self-pay | Admitting: Internal Medicine

## 2018-04-25 NOTE — Assessment & Plan Note (Signed)
Doing better.  On citalopram.  Follow.

## 2018-04-25 NOTE — Assessment & Plan Note (Signed)
On lipitor.  Low cholesterol diet and exercise.  Follow lipid panel and liver function tests.   

## 2018-04-25 NOTE — Assessment & Plan Note (Signed)
Blood pressure still elevated.  Increase amlodipine to 10mg  q day.  Follow pressures. Follow metabolic panel.

## 2018-04-25 NOTE — Assessment & Plan Note (Signed)
On zantac.  Increased acid reflux despite taking zantac.  Will start protonix daily.  Follow.  Try to avoid eating late, etc.

## 2018-04-25 NOTE — Assessment & Plan Note (Signed)
Colonoscopy 09/2008 - internal hemorrhoids and diverticulosis.  Currently asymptomatic.  Recommended f/u in 2019.  Refer to GI for f/u colonoscopy.  Will also refer her for persistent acid reflux.

## 2018-04-27 ENCOUNTER — Other Ambulatory Visit (INDEPENDENT_AMBULATORY_CARE_PROVIDER_SITE_OTHER): Payer: Managed Care, Other (non HMO)

## 2018-04-27 DIAGNOSIS — R739 Hyperglycemia, unspecified: Secondary | ICD-10-CM

## 2018-04-27 DIAGNOSIS — E7800 Pure hypercholesterolemia, unspecified: Secondary | ICD-10-CM | POA: Diagnosis not present

## 2018-04-27 DIAGNOSIS — I10 Essential (primary) hypertension: Secondary | ICD-10-CM | POA: Diagnosis not present

## 2018-04-27 LAB — BASIC METABOLIC PANEL
BUN: 15 mg/dL (ref 6–23)
CALCIUM: 9.4 mg/dL (ref 8.4–10.5)
CO2: 29 mEq/L (ref 19–32)
CREATININE: 0.73 mg/dL (ref 0.40–1.20)
Chloride: 103 mEq/L (ref 96–112)
GFR: 86.69 mL/min (ref >60.00)
GLUCOSE: 115 mg/dL — AB (ref 70–99)
Potassium: 4.2 mEq/L (ref 3.5–5.1)
Sodium: 140 mEq/L (ref 135–145)

## 2018-04-27 LAB — LIPID PANEL
CHOL/HDL RATIO: 3
CHOLESTEROL: 158 mg/dL (ref 0–200)
HDL: 53.9 mg/dL (ref >39.00)
LDL CALC: 75 mg/dL (ref 0–99)
NonHDL: 104.08
Triglycerides: 146 mg/dL (ref 0.0–149.0)
VLDL: 29.2 mg/dL (ref 0.0–40.0)

## 2018-04-27 LAB — TSH: TSH: 1.2 u[IU]/mL (ref 0.35–4.50)

## 2018-04-27 LAB — HEPATIC FUNCTION PANEL
ALT: 34 U/L (ref 0–35)
AST: 31 U/L (ref 0–37)
Albumin: 4.4 g/dL (ref 3.5–5.2)
Alkaline Phosphatase: 50 U/L (ref 39–117)
BILIRUBIN TOTAL: 0.6 mg/dL (ref 0.2–1.2)
Bilirubin, Direct: 0.2 mg/dL (ref 0.0–0.3)
Total Protein: 7.8 g/dL (ref 6.0–8.3)

## 2018-04-27 LAB — HEMOGLOBIN A1C: HEMOGLOBIN A1C: 6.1 % (ref 4.6–6.5)

## 2018-04-29 ENCOUNTER — Encounter: Payer: Self-pay | Admitting: Internal Medicine

## 2018-05-03 ENCOUNTER — Telehealth: Payer: Self-pay | Admitting: Internal Medicine

## 2018-05-03 NOTE — Telephone Encounter (Signed)
Copied from [place] [phone] [id number]. Topic: Quick Communication - Rx Refill/Question >> May 03, 2018  3:55 PM Oliver Pila B wrote: Medication: pantoprazole (PROTONIX) 40 MG tablet [[id number]]   Has the patient contacted their pharmacy? Yes.   (Agent: If no, request that the patient contact the pharmacy for the refill.) (Agent: If yes, when and what did the pharmacy advise?)  Preferred Pharmacy (with phone number or street name): [facility]   Agent: Please be advised that RX refills may take up to 3 business days. We ask that you follow-up with your pharmacy.

## 2018-05-04 MED ORDER — PANTOPRAZOLE SODIUM 40 MG PO TBEC: 40.0000 mg | DELAYED_RELEASE_TABLET | Freq: Every day | ORAL | 2 refills | Status: DC

## 2018-05-12 ENCOUNTER — Telehealth: Payer: Self-pay | Admitting: Internal Medicine

## 2018-05-12 NOTE — Telephone Encounter (Signed)
PT dropped off prescription renewal forms. Forms located up front in Dr. Bary Leriche color folder.

## 2018-05-16 ENCOUNTER — Other Ambulatory Visit: Payer: Self-pay | Admitting: Internal Medicine

## 2018-05-17 ENCOUNTER — Other Ambulatory Visit: Payer: Self-pay | Admitting: Internal Medicine

## 2018-05-17 NOTE — Telephone Encounter (Signed)
Forms signed by Dr. Nicki Reaper and placed in box

## 2018-05-19 NOTE — Telephone Encounter (Signed)
Faxed to [facility]

## 2018-06-01 ENCOUNTER — Telehealth: Payer: Self-pay | Admitting: Internal Medicine

## 2018-06-01 NOTE — Telephone Encounter (Signed)
Pt dropped off [facility] Renewal form to be filled out. This is for Citalopram 40mg   Pt is aware that Dr.Scott is out of the office until 06/07/18 Placed in Dr. Bary Leriche colored folder upfront

## 2018-06-02 NOTE — Telephone Encounter (Signed)
Dr. Nicki Reaper has signed and placed in box

## 2018-06-25 ENCOUNTER — Ambulatory Visit (INDEPENDENT_AMBULATORY_CARE_PROVIDER_SITE_OTHER): Payer: Managed Care, Other (non HMO) | Admitting: Internal Medicine

## 2018-06-25 ENCOUNTER — Encounter: Payer: Self-pay | Admitting: Internal Medicine

## 2018-06-25 VITALS — BP 130/78 | HR 94 | Temp 98.2°F | Resp 18 | Wt 277.2 lb

## 2018-06-25 DIAGNOSIS — I10 Essential (primary) hypertension: Secondary | ICD-10-CM

## 2018-06-25 DIAGNOSIS — K219 Gastro-esophageal reflux disease without esophagitis: Secondary | ICD-10-CM | POA: Diagnosis not present

## 2018-06-25 DIAGNOSIS — R739 Hyperglycemia, unspecified: Secondary | ICD-10-CM

## 2018-06-25 DIAGNOSIS — F419 Anxiety disorder, unspecified: Secondary | ICD-10-CM

## 2018-06-25 DIAGNOSIS — E7800 Pure hypercholesterolemia, unspecified: Secondary | ICD-10-CM | POA: Diagnosis not present

## 2018-06-25 DIAGNOSIS — Z23 Encounter for immunization: Secondary | ICD-10-CM

## 2018-06-25 DIAGNOSIS — R400 Somnolence: Secondary | ICD-10-CM

## 2018-06-25 NOTE — Progress Notes (Addendum)
[name], female   DOB: 24-Jul-1961, 57 y.o.   MRN: [id number]   Subjective:    [name], female    DOB: [birth year], 57 y.o.   MRN: [id number]  HPI  Patient here for a scheduled follow up.  She reports she is doing better.  Handling stress.  Tries to stay active.  No chest pain.  No sob.  No acid reflux.  No abdominal pain.  Bowels moving.  protonix controlling acid reflux.  Discussed the need for colonoscopy.  She will notify me when agreeable.  She does report some fatigue.  Some increased daytime somnolence.  Can fall asleep easily.  Does not feel rested.     Past Medical History:  Diagnosis Date  . Allergy   . Anemia   . Depression   . Diverticulosis   . FHx: migraine headaches   . GERD (gastroesophageal reflux disease)   . History of chickenpox   . Hypercholesterolemia   . Hypertension   . Osteoarthritis   . Panic attacks    Past Surgical History:  Procedure Laterality Date  . ABDOMINAL HYSTERECTOMY    . CATARACT EXTRACTION W/PHACO Left 08/11/2017   Procedure: CATARACT EXTRACTION PHACO AND INTRAOCULAR LENS PLACEMENT ([facility]);  Surgeon: Birder Robson, MD;  Location: [facility] OR[facility];  Service: Ophthalmology;  Laterality: Left;  [place] 00:31 AP% 19.2 CDE 6.13 Fluid Pack lot # [id number]  . CATARACT EXTRACTION W/PHACO Right 09/01/2017   Procedure: CATARACT EXTRACTION PHACO AND INTRAOCULAR LENS PLACEMENT ([place]);  Surgeon: Birder Robson, MD;  Location: [facility] OR[facility];  Service: Ophthalmology;  Laterality: Right;  [place] 00:30 AP% 11.7 CDE3.52 fluid pack lot #[id number]  . Child Birth  14 and 89  . FRACTURE SURGERY    . LAPAROSCOPIC SUPRACERVICAL HYSTERECTOMY  2006   ovaries left in place  . left elbow    . left knee    . Miscarriage  1987  . TONSILLECTOMY  1963  . TUBAL LIGATION  1993   Family History  Problem Relation Age of Onset  . Heart disease Mother        Incomplete heart block  . Hypertension Mother   . Diabetes Mother   . Nephrolithiasis  Mother   . Migraines Mother   . Arthritis Mother        degenerative-back,osteoporosis  . Heart disease Father 74       Myocardial infarction  . Heart disease Sister        h/o MI  . Migraines Sister    Social History   Socioeconomic History  . Marital status: Married    Spouse name: Not on file  . Number of children: Not on file  . Years of education: Not on file  . Highest education level: Not on file  Occupational History  . Not on file  Social Needs  . Financial resource strain: Not on file  . Food insecurity:    Worry: Not on file    Inability: Not on file  . Transportation needs:    Medical: Not on file    Non-medical: Not on file  Tobacco Use  . Smoking status: Former Research scientist (life sciences)  . Smokeless tobacco: Never Used  Substance and Sexual Activity  . Alcohol use: No    Frequency: Never  . Drug use: No  . Sexual activity: Not on file  Lifestyle  . Physical activity:    Days per week: Not on file    Minutes per session: Not on file  .  Stress: Not on file  Relationships  . Social connections:    Talks on phone: Not on file    Gets together: Not on file    Attends religious service: Not on file    Active member of club or organization: Not on file    Attends meetings of clubs or organizations: Not on file    Relationship status: Not on file  Other Topics Concern  . Not on file  Social History Narrative  . Not on file    Outpatient Encounter Medications as of 06/25/2018  Medication Sig  . acetaminophen (TYLENOL) 650 MG CR tablet Take 650 mg by mouth every 8 (eight) hours as needed for pain.  Marland Kitchen amLODipine (NORVASC) 10 MG tablet Take 1 tablet (10 mg total) by mouth daily.  Marland Kitchen atorvastatin (LIPITOR) 20 MG tablet TAKE 1 TABLET BY MOUTH DAILY  . citalopram (CELEXA) 40 MG tablet TAKE 1 TABLET(40 MG) BY MOUTH DAILY  . lisinopril (PRINIVIL,ZESTRIL) 40 MG tablet TAKE 1 TABLET(40 MG) BY MOUTH DAILY  . pantoprazole (PROTONIX) 40 MG tablet Take 1 tablet (40 mg total) by mouth  daily.  . [DISCONTINUED] amLODipine (NORVASC) 5 MG tablet TAKE 1 TABLET(5 MG) BY MOUTH DAILY  . [DISCONTINUED] DUREZOL 0.05 % EMUL Place 1 drop 2 (two) times daily into the left eye.  . [DISCONTINUED] ILEVRO 0.3 % ophthalmic suspension 1 drop 2 (two) times daily by Operative site/used throughout procedure route.  . [DISCONTINUED] moxifloxacin (VIGAMOX) 0.5 % ophthalmic solution Place 1 drop 4 (four) times daily into the right eye.  . [DISCONTINUED] ranitidine (ZANTAC) 150 MG tablet Take 150 mg 2 (two) times daily by mouth.   No facility-administered encounter medications on file as of 06/25/2018.     Review of Systems  Constitutional: Negative for appetite change and unexpected weight change.  HENT: Negative for congestion and sinus pressure.   Respiratory: Negative for cough, chest tightness and shortness of breath.   Cardiovascular: Negative for chest pain, palpitations and leg swelling.  Gastrointestinal: Negative for abdominal pain, diarrhea, nausea and vomiting.  Genitourinary: Negative for difficulty urinating and dysuria.  Musculoskeletal: Negative for joint swelling.  Skin: Negative for color change and rash.  Neurological: Negative for dizziness, light-headedness and headaches.  Psychiatric/Behavioral: Negative for agitation and dysphoric mood.       Objective:    Physical Exam  Constitutional: She appears well-developed and well-nourished. No distress.  HENT:  Nose: Nose normal.  Mouth/Throat: Oropharynx is clear and moist.  Neck: Neck supple. No thyromegaly present.  Cardiovascular: Normal rate and regular rhythm.  Pulmonary/Chest: Breath sounds normal. No respiratory distress. She has no wheezes.  Abdominal: Soft. Bowel sounds are normal. There is no tenderness.  Musculoskeletal: She exhibits no edema or tenderness.  Lymphadenopathy:    She has no cervical adenopathy.  Skin: No rash noted. No erythema.  Psychiatric: She has a normal mood and affect. Her behavior is  normal.    BP 130/78 (BP Location: Right Arm, Patient Position: Sitting, Cuff Size: Large)   Pulse 94   Temp 98.2 F (36.8 C) (Oral)   Resp 18   Wt 277 lb 3.2 oz (125.7 kg)   SpO2 97%   BMI 43.42 kg/m  Wt Readings from Last 3 Encounters:  06/25/18 277 lb 3.2 oz (125.7 kg)  04/22/18 278 lb (126.1 kg)  12/17/17 268 lb 12.8 oz (121.9 kg)     Lab Results  Component Value Date   WBC 7.4 09/14/2017   HGB 14.2 09/14/2017  HCT 42.2 09/14/2017   PLT 228.0 09/14/2017   GLUCOSE 115 (H) 04/27/2018   CHOL 158 04/27/2018   TRIG 146.0 04/27/2018   HDL 53.90 04/27/2018   LDLCALC 75 04/27/2018   ALT 34 04/27/2018   AST 31 04/27/2018   NA 140 04/27/2018   K 4.2 04/27/2018   CL 103 04/27/2018   CREATININE 0.73 04/27/2018   BUN 15 04/27/2018   CO2 29 04/27/2018   TSH 1.20 04/27/2018   HGBA1C 6.1 04/27/2018    Mm Digital Screening Bilateral  Result Date: 09/18/2017 CLINICAL DATA:  Screening. EXAM: DIGITAL SCREENING BILATERAL MAMMOGRAM WITH CAD COMPARISON:  Previous exam(s). ACR Breast Density Category a: The breast tissue is almost entirely fatty. FINDINGS: There are no findings suspicious for malignancy. Images were processed with CAD. IMPRESSION: No mammographic evidence of malignancy. A result letter of this screening mammogram will be mailed directly to the patient. RECOMMENDATION: Screening mammogram in one year. (Code:SM-B-01Y) BI-RADS CATEGORY  1: Negative. Electronically Signed   By: Lajean Manes M.D.   On: 09/18/2017 13:44       Assessment & Plan:   Problem List Items Addressed This Visit    Anxiety    Doing well on celexa.  Better.  Follow.        Daytime somnolence    Increased fatigue with daytime somnolence.  Is hypertensive.  Discussed concern over possible sleep apnea.  Discussed referral to pulmonary for further evaluation and possible need for sleep study.  She will let me know if agreeable.        Essential hypertension, benign    Blood pressure improved.  Continue same medication regimen.  Follow pressures.  Follow metabolic panel.       Relevant Orders   CBC with Differential/Platelet   Basic metabolic panel   GERD (gastroesophageal reflux disease)    protonix helping acid reflux.  Much improved.  Not an issue for her now.  Follow.        Hypercholesterolemia    On lipitor.  Low cholesterol diet and exercise.  Follow lipid panel and liver function tests.        Relevant Orders   Hepatic function panel   Lipid panel    Other Visit Diagnoses    Need for influenza vaccination    -  Primary   Relevant Orders   Flu Vaccine QUAD 6+ mos PF IM (Fluarix Quad PF) (Completed)   Hyperglycemia       Relevant Orders   Hemoglobin A1c       Einar Pheasant, MD

## 2018-06-27 ENCOUNTER — Encounter: Payer: Self-pay | Admitting: Internal Medicine

## 2018-06-27 NOTE — Assessment & Plan Note (Signed)
On lipitor.  Low cholesterol diet and exercise.  Follow lipid panel and liver function tests.   

## 2018-06-27 NOTE — Assessment & Plan Note (Signed)
protonix helping acid reflux.  Much improved.  Not an issue for her now.  Follow.

## 2018-06-27 NOTE — Assessment & Plan Note (Signed)
Blood pressure improved. Continue same medication regimen.  Follow pressures.  Follow metabolic panel.

## 2018-06-27 NOTE — Assessment & Plan Note (Signed)
Doing well on celexa.  Better.  Follow.

## 2018-06-28 ENCOUNTER — Encounter: Payer: Self-pay | Admitting: Internal Medicine

## 2018-06-28 DIAGNOSIS — R400 Somnolence: Secondary | ICD-10-CM

## 2018-06-28 NOTE — Telephone Encounter (Signed)
Duplicate.  See attached.   

## 2018-06-28 NOTE — Telephone Encounter (Signed)
Please call and explain to her that we do a referral to pulmonary and then the order the test.  If agreeable, let me know and I will place the order for the referral.

## 2018-06-30 NOTE — Telephone Encounter (Signed)
Patient would like to be referred to pulmonary for sleep study

## 2018-06-30 NOTE — Telephone Encounter (Signed)
Patient would like to be referred to [facility] for colonoscopy before the end of the year

## 2018-06-30 NOTE — Telephone Encounter (Signed)
See last unrouted msg 

## 2018-06-30 NOTE — Telephone Encounter (Signed)
See last unrouted message

## 2018-07-01 DIAGNOSIS — G4730 Sleep apnea, unspecified: Secondary | ICD-10-CM | POA: Insufficient documentation

## 2018-07-01 NOTE — Telephone Encounter (Signed)
Order placed for pulmonary referral.  

## 2018-07-01 NOTE — Telephone Encounter (Signed)
Duplicate message.  Order placed for pulmonary referral.  My chart message sent to pt.

## 2018-07-01 NOTE — Assessment & Plan Note (Signed)
Increased fatigue with daytime somnolence.  Is hypertensive.  Discussed concern over possible sleep apnea.  Discussed referral to pulmonary for further evaluation and possible need for sleep study.  She will let me know if agreeable.

## 2018-07-05 ENCOUNTER — Ambulatory Visit (INDEPENDENT_AMBULATORY_CARE_PROVIDER_SITE_OTHER): Payer: Managed Care, Other (non HMO) | Admitting: Internal Medicine

## 2018-07-05 ENCOUNTER — Encounter: Payer: Self-pay | Admitting: Internal Medicine

## 2018-07-05 VITALS — BP 126/78 | HR 91 | Resp 16 | Ht 67.0 in | Wt 278.0 lb

## 2018-07-05 DIAGNOSIS — G4719 Other hypersomnia: Secondary | ICD-10-CM | POA: Diagnosis not present

## 2018-07-05 NOTE — Progress Notes (Signed)
[facility] Consultation      Assessment and Plan:  Excessive daytime sleepiness. - Symptoms and signs of obstructive sleep apnea. -We will send for sleep study, start on CPAP as appropriate.  Essential hypertension. - Obstructive sleep apnea can contribute to elevated blood pressure, therefore treatment of sleep apnea is an important part of hypertension management.   Date: 07/05/2018  MRN# [id number] Heather Blake Oct 13, 1961    Heather Blake is a 57 y.o. old female seen in consultation for chief complaint of:    Chief Complaint  Patient presents with  . Consult    Referred by Einar Pheasant for eval of sleep apnea:  . excessive daytime sleepiness    HPI:   Patient is referred today for symptoms of excessive daytime sleepiness.  Her Epworth score is elevated at 12.  She goes to bed at 3 AM, falls asleep within 15 minutes, gets out of bed at 10 AM. She notes that she feels tired all the time, she wakes with continued sleepiness. Her husband notes that she snores and gasps for air at night.  No sleep paralysis, no sleep walking. No dentures, no jaw pain, no TMJ.  She was tested for OSA about 10 years ago and was told she was ok.     PMHX:   Past Medical History:  Diagnosis Date  . Allergy   . Anemia   . Depression   . Diverticulosis   . FHx: migraine headaches   . GERD (gastroesophageal reflux disease)   . History of chickenpox   . Hypercholesterolemia   . Hypertension   . Osteoarthritis   . Panic attacks    Surgical Hx:  Past Surgical History:  Procedure Laterality Date  . ABDOMINAL HYSTERECTOMY    . CATARACT EXTRACTION W/PHACO Left 08/11/2017   Procedure: CATARACT EXTRACTION PHACO AND INTRAOCULAR LENS PLACEMENT (IOC);  Surgeon: Birder Robson, MD;  Location: [facility] ORS;  Service: Ophthalmology;  Laterality: Left;  [place] 00:31 AP% 19.2 CDE 6.13 Fluid Pack lot # [id number]  . CATARACT EXTRACTION W/PHACO Right 09/01/2017   Procedure:  CATARACT EXTRACTION PHACO AND INTRAOCULAR LENS PLACEMENT ([place]);  Surgeon: Birder Robson, MD;  Location: [facility] ORS;  Service: Ophthalmology;  Laterality: Right;  [place] 00:30 AP% 11.7 CDE3.52 fluid pack lot #[id number]  . Child Birth  54 and 21  . FRACTURE SURGERY    . LAPAROSCOPIC SUPRACERVICAL HYSTERECTOMY  2006   ovaries left in place  . left elbow    . left knee    . Miscarriage  1987  . TONSILLECTOMY  1963  . TUBAL LIGATION  1993   Family Hx:  Family History  Problem Relation Age of Onset  . Heart disease Mother        Incomplete heart block  . Hypertension Mother   . Diabetes Mother   . Nephrolithiasis Mother   . Migraines Mother   . Arthritis Mother        degenerative-back,osteoporosis  . Heart disease Father 40       Myocardial infarction  . Heart disease Sister        h/o MI  . Migraines Sister    Social Hx:   Social History   Tobacco Use  . Smoking status: Former Research scientist (life sciences)  . Smokeless tobacco: Never Used  Substance Use Topics  . Alcohol use: No    Frequency: Never  . Drug use: No   Medication:    Current Outpatient Medications:  .  acetaminophen (TYLENOL) 650 MG CR  tablet, Take 650 mg by mouth every 8 (eight) hours as needed for pain., Disp: , Rfl:  .  amLODipine (NORVASC) 10 MG tablet, Take 1 tablet (10 mg total) by mouth daily., Disp: 30 tablet, Rfl: 5 .  atorvastatin (LIPITOR) 20 MG tablet, TAKE 1 TABLET BY MOUTH DAILY, Disp: 90 tablet, Rfl: 0 .  citalopram (CELEXA) 40 MG tablet, TAKE 1 TABLET(40 MG) BY MOUTH DAILY, Disp: 90 tablet, Rfl: 0 .  lisinopril (PRINIVIL,ZESTRIL) 40 MG tablet, TAKE 1 TABLET(40 MG) BY MOUTH DAILY, Disp: 30 tablet, Rfl: 2 .  pantoprazole (PROTONIX) 40 MG tablet, Take 1 tablet (40 mg total) by mouth daily., Disp: 30 tablet, Rfl: 2   Allergies:  Aspartame; Celebrex [celecoxib]; Imitrex [sumatriptan]; Keflex [cephalexin]; Nsaids; Paxil [paroxetine hcl]; Percocet [oxycodone-acetaminophen]; Rofecoxib; Sulfa antibiotics; and Tylenol  [acetaminophen]  Review of Systems: Gen:  Denies  fever, sweats, chills HEENT: Denies blurred vision, double vision. bleeds, sore throat Cvc:  No dizziness, chest pain. Resp:   Denies cough or sputum production, shortness of breath Gi: Denies swallowing difficulty, stomach pain. Gu:  Denies bladder incontinence, burning urine Ext:   No Joint pain, stiffness. Skin: No skin rash,  hives  Endoc:  No polyuria, polydipsia. Psych: No depression, insomnia. Other:  All other systems were reviewed with the patient and were negative other that what is mentioned in the HPI.   Physical Examination:   VS: BP 126/78 (BP Location: Left Arm, Cuff Size: Large)   Pulse 91   Resp 16   Ht 5\' 7"  (1.702 m)   Wt 278 lb (126.1 kg)   SpO2 100%   BMI 43.54 kg/m   General Appearance: No distress  Neuro:without focal findings,  speech normal,  HEENT: PERRLA, EOM intact.   Pulmonary: normal breath sounds, No wheezing.  CardiovascularNormal S1,S2.  No m/r/g.   Abdomen: Benign, Soft, non-tender. Renal:  No costovertebral tenderness  GU:  No performed at this time. Endoc: No evident thyromegaly, no signs of acromegaly. Skin:   warm, no rashes, no ecchymosis  Extremities: normal, no cyanosis, clubbing.  Other findings:    LABORATORY PANEL:   CBC No results for input(s): WBC, HGB, HCT, PLT in the last 168 hours. ------------------------------------------------------------------------------------------------------------------  Chemistries  No results for input(s): NA, K, CL, CO2, GLUCOSE, BUN, CREATININE, CALCIUM, MG, AST, ALT, ALKPHOS, BILITOT in the last 168 hours.  Invalid input(s): GFRCGP ------------------------------------------------------------------------------------------------------------------  Cardiac Enzymes No results for input(s): TROPONINI in the last 168 hours. ------------------------------------------------------------  RADIOLOGY:  No results found.     Thank  you for  the consultation and for allowing [facility] to assist in the care of your patient. Our recommendations are noted above.  Please contact us if we can be of further service.   Marda Stalker, M.D., F.C.C.P.  Board Certified in Internal Medicine, Pulmonary Medicine, [place], and Sleep Medicine.  [facility] Office Number: [phone]   07/05/2018

## 2018-07-05 NOTE — Patient Instructions (Signed)

## 2018-07-26 ENCOUNTER — Other Ambulatory Visit: Payer: Self-pay | Admitting: Internal Medicine

## 2018-08-10 ENCOUNTER — Other Ambulatory Visit: Payer: Self-pay | Admitting: Internal Medicine

## 2018-08-10 DIAGNOSIS — Z1231 Encounter for screening mammogram for malignant neoplasm of breast: Secondary | ICD-10-CM

## 2018-08-13 DIAGNOSIS — G4733 Obstructive sleep apnea (adult) (pediatric): Secondary | ICD-10-CM | POA: Diagnosis not present

## 2018-08-19 ENCOUNTER — Telehealth: Payer: Self-pay | Admitting: *Deleted

## 2018-08-19 DIAGNOSIS — G4733 Obstructive sleep apnea (adult) (pediatric): Secondary | ICD-10-CM | POA: Diagnosis not present

## 2018-08-19 NOTE — Telephone Encounter (Signed)
Pt aware. Orders placed. 

## 2018-08-19 NOTE — Telephone Encounter (Signed)
spoke with spouse asked that patient call back for more detail. Total of 22 apnea Mild sleep apnea AHI of 9.6   Recommend auto -cpap with pressure of 5-15 cmH20

## 2018-08-24 ENCOUNTER — Encounter: Payer: Self-pay | Admitting: *Deleted

## 2018-08-24 ENCOUNTER — Telehealth: Payer: Self-pay

## 2018-08-24 NOTE — Telephone Encounter (Signed)
Patients husband called about making f/u apt. Notified him that we do not need to see patient until she has been on CPAP 31-90 days. She is being set up 09/14/18. Notified them to call after 30 days on CPAP and make apt as our schedule is not out yet. Husband understands.

## 2018-08-25 ENCOUNTER — Ambulatory Visit: Payer: Managed Care, Other (non HMO) | Admitting: Certified Registered Nurse Anesthetist

## 2018-08-25 ENCOUNTER — Ambulatory Visit
Admission: RE | Admit: 2018-08-25 | Discharge: 2018-08-25 | Disposition: A | Discharge status: Home / Self Care | Payer: Managed Care, Other (non HMO) | Source: Ambulatory Visit | Attending: Internal Medicine | Admitting: Internal Medicine

## 2018-08-25 ENCOUNTER — Encounter
Admission: RE | Disposition: A | Discharge status: Home / Self Care | Payer: Self-pay | Source: Ambulatory Visit | Attending: Internal Medicine

## 2018-08-25 DIAGNOSIS — Z885 Allergy status to narcotic agent status: Secondary | ICD-10-CM | POA: Insufficient documentation

## 2018-08-25 DIAGNOSIS — Z882 Allergy status to sulfonamides status: Secondary | ICD-10-CM | POA: Insufficient documentation

## 2018-08-25 DIAGNOSIS — E7800 Pure hypercholesterolemia, unspecified: Secondary | ICD-10-CM | POA: Insufficient documentation

## 2018-08-25 DIAGNOSIS — G4730 Sleep apnea, unspecified: Secondary | ICD-10-CM | POA: Insufficient documentation

## 2018-08-25 DIAGNOSIS — I10 Essential (primary) hypertension: Secondary | ICD-10-CM | POA: Insufficient documentation

## 2018-08-25 DIAGNOSIS — K2960 Other gastritis without bleeding: Secondary | ICD-10-CM | POA: Diagnosis not present

## 2018-08-25 DIAGNOSIS — Z1211 Encounter for screening for malignant neoplasm of colon: Secondary | ICD-10-CM | POA: Insufficient documentation

## 2018-08-25 DIAGNOSIS — K210 Gastro-esophageal reflux disease with esophagitis: Secondary | ICD-10-CM | POA: Diagnosis not present

## 2018-08-25 DIAGNOSIS — Z87891 Personal history of nicotine dependence: Secondary | ICD-10-CM | POA: Diagnosis not present

## 2018-08-25 DIAGNOSIS — K640 First degree hemorrhoids: Secondary | ICD-10-CM | POA: Diagnosis not present

## 2018-08-25 DIAGNOSIS — K319 Disease of stomach and duodenum, unspecified: Secondary | ICD-10-CM | POA: Diagnosis not present

## 2018-08-25 DIAGNOSIS — G43909 Migraine, unspecified, not intractable, without status migrainosus: Secondary | ICD-10-CM | POA: Insufficient documentation

## 2018-08-25 DIAGNOSIS — M1990 Unspecified osteoarthritis, unspecified site: Secondary | ICD-10-CM | POA: Insufficient documentation

## 2018-08-25 HISTORY — PX: COLONOSCOPY WITH PROPOFOL: SHX5780

## 2018-08-25 HISTORY — PX: ESOPHAGOGASTRODUODENOSCOPY (EGD) WITH PROPOFOL: SHX5813

## 2018-08-25 HISTORY — DX: Malignant (primary) neoplasm, unspecified: C80.1

## 2018-08-25 HISTORY — DX: Sleep apnea, unspecified: G47.30

## 2018-08-25 HISTORY — DX: Hyperlipidemia, unspecified: E78.5

## 2018-08-25 LAB — GLUCOSE, CAPILLARY: Glucose-Capillary: 101 mg/dL — ABNORMAL HIGH (ref 70–99)

## 2018-08-25 SURGERY — COLONOSCOPY WITH PROPOFOL
Anesthesia: General

## 2018-08-25 MED ORDER — PROPOFOL 10 MG/ML IV BOLUS
INTRAVENOUS | Status: DC | PRN
  Administered 2018-08-25: 70 mg via INTRAVENOUS
  Administered 2018-08-25 (×2): 10 mg via INTRAVENOUS
  Administered 2018-08-25: 20 mg via INTRAVENOUS

## 2018-08-25 MED ORDER — LIDOCAINE HCL (CARDIAC) PF 100 MG/5ML IV SOSY
PREFILLED_SYRINGE | INTRAVENOUS | Status: DC | PRN
  Administered 2018-08-25: 100 mg via INTRAVENOUS

## 2018-08-25 MED ORDER — SODIUM CHLORIDE 0.9 % IV SOLN
INTRAVENOUS | Status: DC
  Administered 2018-08-25: 10:00:00 via INTRAVENOUS

## 2018-08-25 MED ORDER — PROPOFOL 500 MG/50ML IV EMUL
INTRAVENOUS | Status: DC | PRN
  Administered 2018-08-25: 150 ug/kg/min via INTRAVENOUS

## 2018-08-25 MED ORDER — FENTANYL CITRATE (PF) 100 MCG/2ML IJ SOLN
INTRAMUSCULAR | Status: AC
  Filled 2018-08-25: qty 2

## 2018-08-25 MED ORDER — FENTANYL CITRATE (PF) 100 MCG/2ML IJ SOLN
INTRAMUSCULAR | Status: DC | PRN
  Administered 2018-08-25: 100 ug via INTRAVENOUS

## 2018-08-25 NOTE — Anesthesia Post-op Follow-up Note (Signed)
Anesthesia QCDR form completed.        

## 2018-08-25 NOTE — Op Note (Signed)
[facility] Patient Name: Heather Blake Procedure Date: 08/25/2018 10:22 AM MRN: [id number] Account #: [id number] Date of Birth: 07-Apr-1961 Admit Type: Outpatient Age: 57 Room: [facility] ENDO ROOM 3 Gender: Female Note Status: Finalized Procedure:            Upper GI endoscopy Indications:          Esophageal reflux, Nausea with vomiting Providers:            Benay Pike. [name] MD, MD Referring MD:         Einar Pheasant, MD (Referring MD) Medicines:            Propofol per Anesthesia Complications:        No immediate complications. Procedure:            Pre-Anesthesia Assessment:                       - The risks and benefits of the procedure and the                        sedation options and risks were discussed with the                        patient. All questions were answered and informed                        consent was obtained.                       - Patient identification and proposed procedure were                        verified prior to the procedure by the nurse. The                        procedure was verified in the procedure room.                       - ASA Grade Assessment: III - A patient with severe                        systemic disease.                       - After reviewing the risks and benefits, the patient                        was deemed in satisfactory condition to undergo the                        procedure.                       After obtaining informed consent, the endoscope was                        passed under direct vision. Throughout the procedure,                        the patient's blood pressure, pulse, and oxygen  saturations were monitored continuously. The Endoscope                        was introduced through the mouth, and advanced to the                        third part of duodenum. The upper GI endoscopy was                        accomplished without difficulty. The  patient tolerated                        the procedure well. Findings:      The examined esophagus was normal.      Localized moderate inflammation characterized by congestion (edema),       erosions and erythema was found in the gastric antrum. Biopsies were       taken with a cold forceps for Helicobacter pylori testing.      The examined duodenum was normal.      The cardia and gastric fundus were normal on retroflexion. Impression:           - Normal esophagus.                       - Gastritis. Biopsied.                       - Normal examined duodenum. Recommendation:       - Await pathology results.                       - Proceed with colonoscopy Procedure Code(s):    --- Professional ---                       [phone], Esophagogastroduodenoscopy, flexible, transoral;                        with biopsy, single or multiple Diagnosis Code(s):    --- Professional ---                       R11.2, Nausea with vomiting, unspecified                       K21.9, Gastro-esophageal reflux disease without                        esophagitis                       K29.70, Gastritis, unspecified, without bleeding CPT copyright 2018 American Medical Association. All rights reserved. The codes documented in this report are preliminary and upon coder review may  be revised to meet current compliance requirements. Efrain Sella MD, MD 08/25/2018 10:48:46 AM This report has been signed electronically. Number of Addenda: 0 Note Initiated On: 08/25/2018 10:22 AM      [facility]

## 2018-08-25 NOTE — Op Note (Signed)
[facility] Patient Name: Heather Blake Procedure Date: 08/25/2018 10:29 AM MRN: [id number] Account #: [id number] Date of Birth: 1960-11-30 Admit Type: Outpatient Age: 57 Room: [facility] ENDO ROOM 3 Gender: Female Note Status: Finalized Procedure:            Colonoscopy Indications:          Screening for colorectal malignant neoplasm Providers:            Benay Pike. [name] MD, MD Referring MD:         Einar Pheasant, MD (Referring MD) Medicines:            Propofol per Anesthesia Complications:        No immediate complications. Procedure:            Pre-Anesthesia Assessment:                       - The risks and benefits of the procedure and the                        sedation options and risks were discussed with the                        patient. All questions were answered and informed                        consent was obtained.                       - Patient identification and proposed procedure were                        verified prior to the procedure by the nurse. The                        procedure was verified in the procedure room.                       - ASA Grade Assessment: III - A patient with severe                        systemic disease.                       - After reviewing the risks and benefits, the patient                        was deemed in satisfactory condition to undergo the                        procedure.                       After obtaining informed consent, the colonoscope was                        passed under direct vision. Throughout the procedure,                        the patient's blood pressure, pulse, and oxygen  saturations were monitored continuously. The                        Colonoscope was introduced through the anus and                        advanced to the the cecum, identified by appendiceal                        orifice and ileocecal valve. The colonoscopy was                     somewhat difficult due to restricted mobility of the                        colon and a redundant colon. Successful completion of                        the procedure was aided by using manual pressure. The                        patient tolerated the procedure well. The quality of                        the bowel preparation was good. The ileocecal valve,                        appendiceal orifice, and rectum were photographed. Findings:      The perianal and digital rectal examinations were normal. Pertinent       negatives include normal sphincter tone and no palpable rectal lesions.      The colon (entire examined portion) appeared normal.      Non-bleeding internal hemorrhoids were found during retroflexion. The       hemorrhoids were Grade I (internal hemorrhoids that do not prolapse).      The exam was otherwise without abnormality. Impression:           - The entire examined colon is normal.                       - Non-bleeding internal hemorrhoids.                       - The examination was otherwise normal.                       - No specimens collected. Recommendation:       - Patient has a contact number available for                        emergencies. The signs and symptoms of potential                        delayed complications were discussed with the patient.                        Return to normal activities tomorrow. Written discharge                        instructions were provided to the patient.                       -  Resume previous diet.                       - Continue present medications.                       - Repeat colonoscopy in 10 years for screening purposes.                       - Await pathology results from EGD, also performed                        today.                       - Return to physician assistant in 2 months.                       - The findings and recommendations were discussed with                        the  patient and their spouse. Procedure Code(s):    --- Professional ---                       M7544, Colorectal cancer screening; colonoscopy on                        individual not meeting criteria for high risk Diagnosis Code(s):    --- Professional ---                       K64.0, First degree hemorrhoids                       Z12.11, Encounter for screening for malignant neoplasm                        of colon CPT copyright 2018 American Medical Association. All rights reserved. The codes documented in this report are preliminary and upon coder review may  be revised to meet current compliance requirements. Efrain Sella MD, MD 08/25/2018 11:07:36 AM This report has been signed electronically. Number of Addenda: 0 Note Initiated On: 08/25/2018 10:29 AM Scope Withdrawal Time: 0 hours 7 minutes 26 seconds  Total Procedure Duration: 0 hours 14 minutes 50 seconds       [facility]

## 2018-08-25 NOTE — Interval H&P Note (Signed)
History and Physical Interval Note:  08/25/2018 10:28 AM  Heather Blake  has presented today for surgery, with the diagnosis of COLON CA SCREENING GERD NAUSEA AND VOMITING  The various methods of treatment have been discussed with the patient and family. After consideration of risks, benefits and other options for treatment, the patient has consented to  Procedure(s): COLONOSCOPY WITH PROPOFOL (N/A) ESOPHAGOGASTRODUODENOSCOPY (EGD) WITH PROPOFOL (N/A) as a surgical intervention .  The patient's history has been reviewed, patient examined, no change in status, stable for surgery.  I have reviewed the patient's chart and labs.  Questions were answered to the patient's satisfaction.     [place], [place]

## 2018-08-25 NOTE — Transfer of Care (Signed)
Immediate Anesthesia Transfer of Care Note  Patient: Heather Blake  Procedure(s) Performed: COLONOSCOPY WITH PROPOFOL (N/A ) ESOPHAGOGASTRODUODENOSCOPY (EGD) WITH PROPOFOL (N/A )  Patient Location: PACU  Anesthesia Type:General  Level of Consciousness: awake and alert   Airway & Oxygen Therapy: Patient Spontanous Breathing and Patient connected to nasal cannula oxygen  Post-op Assessment: Report given to RN and Post -op Vital signs reviewed and stable  Post vital signs: Reviewed and stable   Last Vitals:  Vitals Value Taken Time  BP 125/64 08/25/2018 11:09 AM  Temp 36.4 C 08/25/2018 11:08 AM  Pulse 93 08/25/2018 11:09 AM  Resp 17 08/25/2018 11:09 AM  SpO2 94 % 08/25/2018 11:09 AM  Vitals shown include unvalidated device data.  Last Pain:  Vitals:   08/25/18 1108  TempSrc: Tympanic  PainSc: Asleep         Complications: No apparent anesthesia complications

## 2018-08-25 NOTE — H&P (Signed)
Outpatient short stay form Pre-procedure 08/25/2018 10:27 AM Heather Blake K. Heather Blake, M.D.  Primary Physician: Einar Pheasant, M.D.  Reason for visit:  GERD, esophagitis, nausea/vomiting, colon cancer screening.  History of present illness: As above. Patient has some intermittent nausea and vomiting in the evenings associated with milk ingestion. There is also GERD but that is well controlled with daily PPI therapy.  Patient presents for colonoscopy for colon cancer screening. The patient denies complaints of abdominal pain, significant change in bowel habits, or rectal bleeding.     Current Facility-Administered Medications:  .  0.9 %  sodium chloride infusion, , Intravenous, Continuous, [place], Benay Pike, MD, Last Rate: 20 mL/hr at 08/25/18 1012  Medications Prior to Admission  Medication Sig Dispense Refill Last Dose  . acetaminophen (TYLENOL) 650 MG CR tablet Take 650 mg by mouth every 8 (eight) hours as needed for pain.   Past Month at Unknown time  . amLODipine (NORVASC) 10 MG tablet Take 1 tablet (10 mg total) by mouth daily. 30 tablet 5 08/25/2018 at Unknown time  . atorvastatin (LIPITOR) 20 MG tablet TAKE 1 TABLET BY MOUTH DAILY 90 tablet 0 08/24/2018 at Unknown time  . citalopram (CELEXA) 40 MG tablet TAKE 1 TABLET(40 MG) BY MOUTH DAILY 90 tablet 0 08/24/2018 at Unknown time  . lisinopril (PRINIVIL,ZESTRIL) 40 MG tablet TAKE 1 TABLET(40 MG) BY MOUTH DAILY 30 tablet 2 08/25/2018 at Unknown time  . ondansetron (ZOFRAN) 4 MG tablet Take 4 mg by mouth every 8 (eight) hours as needed for nausea or vomiting.     . pantoprazole (PROTONIX) 40 MG tablet TAKE 1 TABLET BY MOUTH DAILY. GENERIC EQUIVALENT FOR PROTONIX. 90 tablet 1 Past Week at Unknown time     Allergies  Allergen Reactions  . Aspartame Other (See Comments)     Headache Triggers migraines  . Celebrex [Celecoxib] Other (See Comments)    Gi upset  . Imitrex [Sumatriptan] Other (See Comments)    Chest pain for a prolonged  duration of time  . Keflex [Cephalexin] Nausea Only  . Nsaids Other (See Comments)    Gi/stomach upset  . Paxil [Paroxetine Hcl] Other (See Comments)    Trembling/shaky hands.  Marland Kitchen Percocet [Oxycodone-Acetaminophen] Other (See Comments)    Hallucinations/"I see grasshoppers"  . Rofecoxib Other (See Comments)    Severe stomach upset  . Sulfa Antibiotics Hives  . Tylenol [Acetaminophen] Other (See Comments)    Stomach upset/GI upset with high dosages.     Past Medical History:  Diagnosis Date  . Allergy   . Anemia   . Cancer ([place])   . Depression   . Diverticulosis   . FHx: migraine headaches   . GERD (gastroesophageal reflux disease)   . History of chickenpox   . Hypercholesterolemia   . Hyperlipidemia   . Hypertension   . Osteoarthritis   . Panic attacks   . Sleep apnea     Review of systems:  Otherwise negative.    Physical Exam  Gen: Alert, oriented. Appears stated age.  HEENT: [place]/AT. PERRLA. Lungs: CTA, no wheezes. CV: RR nl S1, S2. Abd: soft, benign, no masses. BS+ Ext: No edema. Pulses 2+    Planned procedures: Proceed with EGD and colonoscopy. The patient understands the nature of the planned procedure, indications, risks, alternatives and potential complications including but not limited to bleeding, infection, perforation, damage to internal organs and possible oversedation/side effects from anesthesia. The patient agrees and gives consent to proceed.  Please refer to procedure notes for findings,  recommendations and patient disposition/instructions.     Janica Eldred K. Heather Blake, M.D. Gastroenterology 08/25/2018  10:27 AM

## 2018-08-25 NOTE — Anesthesia Preprocedure Evaluation (Signed)
Anesthesia Evaluation  Patient identified by MRN, date of birth, ID band Patient awake    Reviewed: Allergy & Precautions, H&P , NPO status , Patient's Chart, lab work & pertinent test results, reviewed documented beta blocker date and time   Airway Mallampati: III   Neck ROM: full    Dental  (+) Poor Dentition, Teeth Intact   Pulmonary sleep apnea and Continuous Positive Airway Pressure Ventilation , former smoker,    Pulmonary exam normal        Cardiovascular Exercise Tolerance: Good hypertension, On Medications negative cardio ROS Normal cardiovascular exam Rhythm:regular Rate:Normal     Neuro/Psych PSYCHIATRIC DISORDERS Anxiety Depression negative neurological ROS     GI/Hepatic Neg liver ROS, GERD  ,  Endo/Other  negative endocrine ROS  Renal/GU negative Renal ROS  negative genitourinary   Musculoskeletal   Abdominal   Peds  Hematology  (+) Blood dyscrasia, anemia ,   Anesthesia Other Findings Past Medical History: No date: Allergy No date: Anemia No date: Cancer ([place]) No date: Depression No date: Diverticulosis No date: FHx: migraine headaches No date: GERD (gastroesophageal reflux disease) No date: History of chickenpox No date: Hypercholesterolemia No date: Hyperlipidemia No date: Hypertension No date: Osteoarthritis No date: Panic attacks No date: Sleep apnea Past Surgical History: No date: ABDOMINAL HYSTERECTOMY 08/11/2017: CATARACT EXTRACTION W/PHACO; Left     Comment:  Procedure: CATARACT EXTRACTION PHACO AND INTRAOCULAR               LENS PLACEMENT (IOC);  Surgeon: Birder Robson, MD;                Location: [facility] ORS;  Service: Ophthalmology;  Laterality:              Left;  [place] 00:31 AP% 19.2 CDE 6.13 Fluid Pack lot #               [id number] 09/01/2017: CATARACT EXTRACTION W/PHACO; Right     Comment:  Procedure: CATARACT EXTRACTION PHACO AND INTRAOCULAR               LENS  PLACEMENT (IOC);  Surgeon: Birder Robson, MD;                Location: [facility] ORS;  Service: Ophthalmology;  Laterality:              Right;  [place] 00:30 AP% 11.7 CDE3.52 fluid pack lot               #[id number] 1981 and 1979: Child Birth No date: COLONOSCOPY No date: ESOPHAGOGASTRODUODENOSCOPY No date: FRACTURE SURGERY 2006: LAPAROSCOPIC SUPRACERVICAL HYSTERECTOMY     Comment:  ovaries left in place No date: left elbow No date: left knee 1987: Miscarriage 1963: TONSILLECTOMY 1993: TUBAL LIGATION BMI    Body Mass Index:  42.29 kg/m     Reproductive/Obstetrics negative OB ROS                             Anesthesia Physical Anesthesia Plan  ASA: III  Anesthesia Plan: General   Post-op Pain Management:    Induction:   PONV Risk Score and Plan:   Airway Management Planned:   Additional Equipment:   Intra-op Plan:   Post-operative Plan:   Informed Consent: I have reviewed the patients History and Physical, chart, labs and discussed the procedure including the risks, benefits and alternatives for the proposed anesthesia with the patient or authorized representative who has indicated his/her understanding  and acceptance.   Dental Advisory Given  Plan Discussed with: CRNA  Anesthesia Plan Comments:         Anesthesia Quick Evaluation

## 2018-08-26 ENCOUNTER — Encounter: Payer: Self-pay | Admitting: Internal Medicine

## 2018-08-26 LAB — SURGICAL PATHOLOGY

## 2018-08-26 NOTE — Anesthesia Postprocedure Evaluation (Signed)
Anesthesia Post Note  Patient: JERUSHA REISING  Procedure(s) Performed: COLONOSCOPY WITH PROPOFOL (N/A ) ESOPHAGOGASTRODUODENOSCOPY (EGD) WITH PROPOFOL (N/A )  Patient location during evaluation: PACU Anesthesia Type: General Level of consciousness: awake and alert Pain management: pain level controlled Vital Signs Assessment: post-procedure vital signs reviewed and stable Respiratory status: spontaneous breathing, nonlabored ventilation, respiratory function stable and patient connected to nasal cannula oxygen Cardiovascular status: blood pressure returned to baseline and stable Postop Assessment: no apparent nausea or vomiting Anesthetic complications: no     Last Vitals:  Vitals:   08/25/18 1118 08/25/18 1138  BP: 123/76 137/76  Pulse:    Resp:    Temp:    SpO2:      Last Pain:  Vitals:   08/26/18 0823  TempSrc:   PainSc: 0-No pain                 Molli Barrows

## 2018-09-20 ENCOUNTER — Ambulatory Visit
Admission: RE | Admit: 2018-09-20 | Discharge: 2018-09-20 | Disposition: A | Discharge status: Home / Self Care | Payer: Managed Care, Other (non HMO) | Source: Ambulatory Visit | Attending: Internal Medicine | Admitting: Internal Medicine

## 2018-09-20 DIAGNOSIS — Z1231 Encounter for screening mammogram for malignant neoplasm of breast: Secondary | ICD-10-CM | POA: Insufficient documentation

## 2018-09-28 ENCOUNTER — Other Ambulatory Visit (INDEPENDENT_AMBULATORY_CARE_PROVIDER_SITE_OTHER): Payer: Managed Care, Other (non HMO)

## 2018-09-28 DIAGNOSIS — E7800 Pure hypercholesterolemia, unspecified: Secondary | ICD-10-CM | POA: Diagnosis not present

## 2018-09-28 DIAGNOSIS — I10 Essential (primary) hypertension: Secondary | ICD-10-CM | POA: Diagnosis not present

## 2018-09-28 DIAGNOSIS — R739 Hyperglycemia, unspecified: Secondary | ICD-10-CM

## 2018-09-28 LAB — LIPID PANEL
CHOLESTEROL: 284 mg/dL — AB (ref 0–200)
HDL: 52.9 mg/dL (ref >39.00)
LDL Cholesterol: 194 mg/dL — ABNORMAL HIGH (ref 0–99)
NONHDL: 230.65
Total CHOL/HDL Ratio: 5
Triglycerides: 182 mg/dL — ABNORMAL HIGH (ref 0.0–149.0)
VLDL: 36.4 mg/dL (ref 0.0–40.0)

## 2018-09-28 LAB — HEMOGLOBIN A1C: Hgb A1c MFr Bld: 5.8 % (ref 4.6–6.5)

## 2018-09-28 LAB — CBC WITH DIFFERENTIAL/PLATELET
BASOS PCT: 0.8 % (ref 0.0–3.0)
Basophils Absolute: 0.1 10*3/uL (ref 0.0–0.1)
EOS PCT: 2.2 % (ref 0.0–5.0)
Eosinophils Absolute: 0.2 10*3/uL (ref 0.0–0.7)
HCT: 41 % (ref 36.0–46.0)
Hemoglobin: 13.9 g/dL (ref 12.0–15.0)
LYMPHS ABS: 3.3 10*3/uL (ref 0.7–4.0)
Lymphocytes Relative: 44.6 % (ref 12.0–46.0)
MCHC: 33.8 g/dL (ref 30.0–36.0)
MCV: 94.2 fl (ref 78.0–100.0)
MONOS PCT: 8.2 % (ref 3.0–12.0)
Monocytes Absolute: 0.6 10*3/uL (ref 0.1–1.0)
NEUTROS ABS: 3.3 10*3/uL (ref 1.4–7.7)
NEUTROS PCT: 44.2 % (ref 43.0–77.0)
PLATELETS: 215 10*3/uL (ref 150.0–400.0)
RBC: 4.36 Mil/uL (ref 3.87–5.11)
RDW: 13.8 % (ref 11.5–15.5)
WBC: 7.5 10*3/uL (ref 4.0–10.5)

## 2018-09-28 LAB — HEPATIC FUNCTION PANEL
ALK PHOS: 48 U/L (ref 39–117)
ALT: 39 U/L — ABNORMAL HIGH (ref 0–35)
AST: 34 U/L (ref 0–37)
Albumin: 4.6 g/dL (ref 3.5–5.2)
BILIRUBIN DIRECT: 0.1 mg/dL (ref 0.0–0.3)
BILIRUBIN TOTAL: 0.5 mg/dL (ref 0.2–1.2)
Total Protein: 7.2 g/dL (ref 6.0–8.3)

## 2018-09-28 LAB — BASIC METABOLIC PANEL
BUN: 13 mg/dL (ref 6–23)
CALCIUM: 9.7 mg/dL (ref 8.4–10.5)
CO2: 29 mEq/L (ref 19–32)
Chloride: 104 mEq/L (ref 96–112)
Creatinine, Ser: 0.66 mg/dL (ref 0.40–1.20)
GFR: 97.24 mL/min (ref >60.00)
GLUCOSE: 97 mg/dL (ref 70–99)
Potassium: 4.4 mEq/L (ref 3.5–5.1)
Sodium: 140 mEq/L (ref 135–145)

## 2018-09-30 ENCOUNTER — Encounter: Payer: Self-pay | Admitting: Internal Medicine

## 2018-09-30 ENCOUNTER — Ambulatory Visit (INDEPENDENT_AMBULATORY_CARE_PROVIDER_SITE_OTHER): Payer: Managed Care, Other (non HMO) | Admitting: Internal Medicine

## 2018-09-30 DIAGNOSIS — R739 Hyperglycemia, unspecified: Secondary | ICD-10-CM

## 2018-09-30 DIAGNOSIS — F419 Anxiety disorder, unspecified: Secondary | ICD-10-CM | POA: Diagnosis not present

## 2018-09-30 DIAGNOSIS — K219 Gastro-esophageal reflux disease without esophagitis: Secondary | ICD-10-CM

## 2018-09-30 DIAGNOSIS — I10 Essential (primary) hypertension: Secondary | ICD-10-CM | POA: Diagnosis not present

## 2018-09-30 DIAGNOSIS — G4730 Sleep apnea, unspecified: Secondary | ICD-10-CM

## 2018-09-30 DIAGNOSIS — E7800 Pure hypercholesterolemia, unspecified: Secondary | ICD-10-CM

## 2018-09-30 NOTE — Progress Notes (Signed)
[name], female   DOB: 03-04-1961, 57 y.o.   MRN: [id number]   Subjective:    [name], female    DOB: October 12, 1961, 57 y.o.   MRN: [id number]  HPI  Patient here for a scheduled follow up.  She reports she is doing relatively well.  Saw pulmonary 07/05/18 for excessive daytime somnolence.  Had sleep study - mild sleep apnea.  Using cpap.  Getting adjusted.  Saw GI and is s/p EGD and colonoscopy.  EGD - gastritis.  Colonoscopy - internal hemorrhoids.  Eating and drinking.  No nausea or vomiting now.  No abdominal pain.  No chest pain.  Breathing stable.  Feels better since using cpap.  Discussed diet and exercise.    Past Medical History:  Diagnosis Date  . Allergy   . Anemia   . Cancer ([place])   . Depression   . Diverticulosis   . FHx: migraine headaches   . GERD (gastroesophageal reflux disease)   . History of chickenpox   . Hypercholesterolemia   . Hyperlipidemia   . Hypertension   . Osteoarthritis   . Panic attacks   . Sleep apnea    Past Surgical History:  Procedure Laterality Date  . ABDOMINAL HYSTERECTOMY    . CATARACT EXTRACTION W/PHACO Left 08/11/2017   Procedure: CATARACT EXTRACTION PHACO AND INTRAOCULAR LENS PLACEMENT ([facility]);  Surgeon: Birder Robson, MD;  Location: [facility] ORS;  Service: Ophthalmology;  Laterality: Left;  [place] 00:31 AP% 19.2 CDE 6.13 Fluid Pack lot # [id number]  . CATARACT EXTRACTION W/PHACO Right 09/01/2017   Procedure: CATARACT EXTRACTION PHACO AND INTRAOCULAR LENS PLACEMENT ([place]);  Surgeon: Birder Robson, MD;  Location: [facility];  Service: Ophthalmology;  Laterality: Right;  [place] 00:30 AP% 11.7 CDE3.52 fluid pack lot #[id number]  . Child Birth  51 and 35  . COLONOSCOPY    . COLONOSCOPY WITH PROPOFOL N/A 08/25/2018   Procedure: COLONOSCOPY WITH PROPOFOL;  Surgeon: Toledo, Benay Pike, MD;  Location: [facility];  Service: Gastroenterology;  Laterality: N/A;  . ESOPHAGOGASTRODUODENOSCOPY    .  ESOPHAGOGASTRODUODENOSCOPY (EGD) WITH PROPOFOL N/A 08/25/2018   Procedure: ESOPHAGOGASTRODUODENOSCOPY (EGD) WITH PROPOFOL;  Surgeon: Toledo, Benay Pike, MD;  Location: [facility];  Service: Gastroenterology;  Laterality: N/A;  . FRACTURE SURGERY    . LAPAROSCOPIC SUPRACERVICAL HYSTERECTOMY  2006   ovaries left in place  . left elbow    . left knee    . Miscarriage  1987  . TONSILLECTOMY  1963  . TUBAL LIGATION  1993   Family History  Problem Relation Age of Onset  . Heart disease Mother        Incomplete heart block  . Hypertension Mother   . Diabetes Mother   . Nephrolithiasis Mother   . Migraines Mother   . Arthritis Mother        degenerative-back,osteoporosis  . Heart failure Mother   . Heart disease Father 89       Myocardial infarction  . Heart failure Father   . Heart disease Sister        h/o MI  . Migraines Sister    Social History   Socioeconomic History  . Marital status: Married    Spouse name: Not on file  . Number of children: Not on file  . Years of education: Not on file  . Highest education level: Not on file  Occupational History  . Not on file  Social Needs  . Financial resource strain: Not on file  .  Food insecurity:    Worry: Not on file    Inability: Not on file  . Transportation needs:    Medical: Not on file    Non-medical: Not on file  Tobacco Use  . Smoking status: Former Research scientist (life sciences)  . Smokeless tobacco: Never Used  Substance and Sexual Activity  . Alcohol use: No    Frequency: Never  . Drug use: No  . Sexual activity: Not on file  Lifestyle  . Physical activity:    Days per week: Not on file    Minutes per session: Not on file  . Stress: Not on file  Relationships  . Social connections:    Talks on phone: Not on file    Gets together: Not on file    Attends religious service: Not on file    Active member of club or organization: Not on file    Attends meetings of clubs or organizations: Not on file    Relationship status:  Not on file  Other Topics Concern  . Not on file  Social History Narrative  . Not on file    Outpatient Encounter Medications as of 09/30/2018  Medication Sig  . acetaminophen (TYLENOL) 650 MG CR tablet Take 650 mg by mouth every 8 (eight) hours as needed for pain.  Marland Kitchen amLODipine (NORVASC) 10 MG tablet Take 1 tablet (10 mg total) by mouth daily.  Marland Kitchen atorvastatin (LIPITOR) 20 MG tablet TAKE 1 TABLET BY MOUTH DAILY  . citalopram (CELEXA) 40 MG tablet TAKE 1 TABLET(40 MG) BY MOUTH DAILY  . lisinopril (PRINIVIL,ZESTRIL) 40 MG tablet TAKE 1 TABLET(40 MG) BY MOUTH DAILY  . ondansetron (ZOFRAN) 4 MG tablet Take 4 mg by mouth every 8 (eight) hours as needed for nausea or vomiting.  . pantoprazole (PROTONIX) 40 MG tablet TAKE 1 TABLET BY MOUTH DAILY. GENERIC EQUIVALENT FOR PROTONIX.   No facility-administered encounter medications on file as of 09/30/2018.     Review of Systems  Constitutional: Negative for appetite change and unexpected weight change.  HENT: Negative for congestion and sinus pressure.   Respiratory: Negative for cough, chest tightness and shortness of breath.   Cardiovascular: Negative for chest pain, palpitations and leg swelling.  Gastrointestinal: Negative for abdominal pain, diarrhea, nausea and vomiting.  Genitourinary: Negative for difficulty urinating and dysuria.  Musculoskeletal: Negative for joint swelling and myalgias.  Skin: Negative for color change and rash.  Neurological: Negative for dizziness, light-headedness and headaches.  Psychiatric/Behavioral: Negative for agitation and dysphoric mood.       Objective:    Physical Exam Constitutional:      General: She is not in acute distress.    Appearance: Normal appearance.  HENT:     Nose: Nose normal. No congestion.     Mouth/Throat:     Pharynx: No oropharyngeal exudate or posterior oropharyngeal erythema.  Neck:     Musculoskeletal: Neck supple. No muscular tenderness.     Thyroid: No thyromegaly.    Cardiovascular:     Rate and Rhythm: Normal rate and regular rhythm.  Pulmonary:     Effort: No respiratory distress.     Breath sounds: Normal breath sounds. No wheezing.  Abdominal:     General: Bowel sounds are normal.     Palpations: Abdomen is soft.     Tenderness: There is no abdominal tenderness.  Musculoskeletal:        General: No swelling or tenderness.  Lymphadenopathy:     Cervical: No cervical adenopathy.  Skin:  Findings: No erythema or rash.  Neurological:     Mental Status: She is alert.  Psychiatric:        Mood and Affect: Mood normal.        Behavior: Behavior normal.     BP (!) 148/98 (BP Location: Right Arm, Patient Position: Sitting, Cuff Size: Large)   Pulse 79   Temp (!) 97.5 F (36.4 C) (Oral)   Resp 16   Wt 273 lb 9.6 oz (124.1 kg)   SpO2 97%   BMI 42.85 kg/m  Wt Readings from Last 3 Encounters:  09/30/18 273 lb 9.6 oz (124.1 kg)  08/25/18 270 lb (122.5 kg)  07/05/18 278 lb (126.1 kg)     Lab Results  Component Value Date   WBC 7.5 09/28/2018   HGB 13.9 09/28/2018   HCT 41.0 09/28/2018   PLT 215.0 09/28/2018   GLUCOSE 97 09/28/2018   CHOL 284 (H) 09/28/2018   TRIG 182.0 (H) 09/28/2018   HDL 52.90 09/28/2018   LDLCALC 194 (H) 09/28/2018   ALT 39 (H) 09/28/2018   AST 34 09/28/2018   NA 140 09/28/2018   K 4.4 09/28/2018   CL 104 09/28/2018   CREATININE 0.66 09/28/2018   BUN 13 09/28/2018   CO2 29 09/28/2018   TSH 1.20 04/27/2018   HGBA1C 5.8 09/28/2018    Mm 3d Screen Breast Bilateral  Result Date: 09/20/2018 CLINICAL DATA:  Screening. EXAM: DIGITAL SCREENING BILATERAL MAMMOGRAM WITH TOMO AND CAD COMPARISON:  Previous exam(s). ACR Breast Density Category a: The breast tissue is almost entirely fatty. FINDINGS: There are no findings suspicious for malignancy. Images were processed with CAD. IMPRESSION: No mammographic evidence of malignancy. A result letter of this screening mammogram will be mailed directly to the patient.  RECOMMENDATION: Screening mammogram in one year. (Code:SM-B-01Y) BI-RADS CATEGORY  1: Negative. Electronically Signed   By: Franki Cabot M.D.   On: 09/20/2018 12:58       Assessment & Plan:   Problem List Items Addressed This Visit    Anxiety    Doing better.  On celexa.  Follow.        Essential hypertension, benign    Blood pressure on recheck improved.  Have her spot check her pressure.  Same medications.  Follow pressure.  Follow metabolic panel.        Relevant Orders   Basic metabolic panel   GERD (gastroesophageal reflux disease)    Saw GI.  Had recent EGD as outlined.  Upper symptoms controlled on current regimen.  Follow.        Hypercholesterolemia    On lipitor.  Low cholesterol diet and exercise.  Follow lipid panel and liver function tests.        Relevant Orders   Hepatic function panel   Lipid panel   Hyperglycemia    Low carb diet and exercise.  Follow met b and a1c.        Relevant Orders   Hemoglobin A1c   Sleep apnea    Recently diagnosed with sleep apnea.  Using cpap.  Trying to get adjusted.  Continue f/u with pulmonary.            Einar Pheasant, MD

## 2018-10-01 ENCOUNTER — Encounter: Payer: Self-pay | Admitting: Internal Medicine

## 2018-10-08 MED ORDER — CONTOUR NEXT MONITOR W/DEVICE KIT: 1.0000 | PACK | Freq: Two times a day (BID) | Status: DC

## 2018-10-08 MED ORDER — ONETOUCH ULTRASOFT LANCETS MISC: 12 refills | Status: DC

## 2018-10-08 NOTE — Telephone Encounter (Signed)
The script sent to pharmacy ask to fill contour next strips.

## 2018-10-09 ENCOUNTER — Encounter: Payer: Self-pay | Admitting: Internal Medicine

## 2018-10-09 DIAGNOSIS — R739 Hyperglycemia, unspecified: Secondary | ICD-10-CM | POA: Insufficient documentation

## 2018-10-09 NOTE — Assessment & Plan Note (Signed)
Recently diagnosed with sleep apnea.  Using cpap.  Trying to get adjusted.  Continue f/u with pulmonary.

## 2018-10-09 NOTE — Assessment & Plan Note (Signed)
Saw GI.  Had recent EGD as outlined.  Upper symptoms controlled on current regimen.  Follow.

## 2018-10-09 NOTE — Assessment & Plan Note (Signed)
Blood pressure on recheck improved.  Have her spot check her pressure.  Same medications.  Follow pressure.  Follow metabolic panel.

## 2018-10-09 NOTE — Assessment & Plan Note (Signed)
On lipitor.  Low cholesterol diet and exercise.  Follow lipid panel and liver function tests.   

## 2018-10-09 NOTE — Assessment & Plan Note (Signed)
Doing better.  On celexa.  Follow.

## 2018-10-09 NOTE — Assessment & Plan Note (Signed)
Low carb diet and exercise.  Follow met b and a1c.   

## 2018-10-15 ENCOUNTER — Encounter: Payer: Self-pay | Admitting: Internal Medicine

## 2018-10-17 ENCOUNTER — Other Ambulatory Visit: Payer: Self-pay | Admitting: Internal Medicine

## 2018-10-19 ENCOUNTER — Ambulatory Visit (INDEPENDENT_AMBULATORY_CARE_PROVIDER_SITE_OTHER): Payer: Managed Care, Other (non HMO) | Admitting: Internal Medicine

## 2018-10-19 ENCOUNTER — Other Ambulatory Visit: Payer: Self-pay | Admitting: Internal Medicine

## 2018-10-19 ENCOUNTER — Encounter: Payer: Self-pay | Admitting: Internal Medicine

## 2018-10-19 VITALS — BP 128/80 | HR 76 | Ht 67.0 in | Wt 274.2 lb

## 2018-10-19 DIAGNOSIS — G4733 Obstructive sleep apnea (adult) (pediatric): Secondary | ICD-10-CM

## 2018-10-19 NOTE — Patient Instructions (Signed)
Continue using cpap every night for the whole night.  

## 2018-10-19 NOTE — Progress Notes (Signed)
[facility] Consultation      Assessment and Plan:  Obstructive sleep apnea.  - Doing well with CPAP, continue every night.  Essential hypertension. - Obstructive sleep apnea can contribute to elevated blood pressure, therefore treatment of sleep apnea is an important part of hypertension management.  Return in about 1 year (around 10/20/2019).   Date: 10/19/2018  MRN# [id number] Heather Blake 04/18/1961    Heather Blake is a 58 y.o. old female seen in consultation for chief complaint of:    Chief Complaint  Patient presents with  . Follow-up    3 month f/u  . Sleep Apnea    CPAP going well    HPI:   She has been using the CPAP every night, for the whole night, and she is not sleepy during the day. She wakes in the morning feeling rested.   **CPAP download 09/16/2018-10/15/2018>> usage greater than 4 hours 29/30 days average usage days used is 9 hours 31 minutes.  Pressure range 5-15.  Median pressure 9, 95th percentile pressure 11, maximum pressure 13.  Leaks are within normal limits.  Residual AHI 0.8.  Overall this shows very good compliance with CPAP with excellent control of obstructive sleep apnea.   Medication:    Current Outpatient Medications:  .  acetaminophen (TYLENOL) 650 MG CR tablet, Take 650 mg by mouth every 8 (eight) hours as needed for pain., Disp: , Rfl:  .  amLODipine (NORVASC) 10 MG tablet, TAKE 1 TABLET(10 MG) BY MOUTH DAILY, Disp: 30 tablet, Rfl: 5 .  atorvastatin (LIPITOR) 20 MG tablet, TAKE 1 TABLET BY MOUTH DAILY, Disp: 90 tablet, Rfl: 0 .  Blood Glucose Monitoring Suppl (CONTOUR NEXT MONITOR) w/Device KIT, 1 kit by Does not apply route 2 (two) times daily., Disp: 1 kit, Rfl: o .  citalopram (CELEXA) 40 MG tablet, TAKE 1 TABLET BY MOUTH DAILY. GENERIC EQUIVALENT FOR CELEXA, Disp: 90 tablet, Rfl: 0 .  Lancets (ONETOUCH ULTRASOFT) lancets, Use as instructed, Disp: 100 each, Rfl: 12 .  lisinopril (PRINIVIL,ZESTRIL) 40 MG tablet,  TAKE 1 TABLET(40 MG) BY MOUTH DAILY, Disp: 30 tablet, Rfl: 2 .  ondansetron (ZOFRAN) 4 MG tablet, Take 4 mg by mouth every 8 (eight) hours as needed for nausea or vomiting., Disp: , Rfl:  .  pantoprazole (PROTONIX) 40 MG tablet, TAKE 1 TABLET BY MOUTH DAILY. GENERIC EQUIVALENT FOR PROTONIX., Disp: 90 tablet, Rfl: 1   Allergies:  Aspartame; Celebrex [celecoxib]; Imitrex [sumatriptan]; Keflex [cephalexin]; Nsaids; Paxil [paroxetine hcl]; Percocet [oxycodone-acetaminophen]; Rofecoxib; Sulfa antibiotics; and Tylenol [acetaminophen]  Review of Systems:  Constitutional: Feels well. Cardiovascular: Denies chest pain, exertional chest pain.  Pulmonary: Denies hemoptysis, pleuritic chest pain.   The remainder of systems were reviewed and were found to be negative other than what is documented in the HPI.    Physical Examination:   VS: BP 128/80 (BP Location: Left Arm, Cuff Size: Normal)   Pulse 76   Ht 5' 7"  (1.702 m)   Wt 274 lb 3.2 oz (124.4 kg)   SpO2 96%   BMI 42.95 kg/m   General Appearance: No distress  Neuro:without focal findings, mental status, speech normal, alert and oriented HEENT: PERRLA, EOM intact Pulmonary: No wheezing, No rales  CardiovascularNormal S1,S2.  No m/r/g.  Abdomen: Benign, Soft, non-tender, No masses Renal:  No costovertebral tenderness  GU:  No performed at this time. Endoc: No evident thyromegaly, no signs of acromegaly or Cushing features Skin:   warm, no rashes, no ecchymosis  Extremities: normal, no cyanosis, clubbing.      LABORATORY PANEL:   CBC No results for input(s): WBC, HGB, HCT, PLT in the last 168 hours. ------------------------------------------------------------------------------------------------------------------  Chemistries  No results for input(s): NA, K, CL, CO2, GLUCOSE, BUN, CREATININE, CALCIUM, MG, AST, ALT, ALKPHOS, BILITOT in the last 168 hours.  Invalid input(s):  GFRCGP ------------------------------------------------------------------------------------------------------------------  Cardiac Enzymes No results for input(s): TROPONINI in the last 168 hours. ------------------------------------------------------------  RADIOLOGY:  No results found.     Thank  you for the consultation and for allowing [facility] to assist in the care of your patient. Our recommendations are noted above.  Please contact us if we can be of further service.   Marda Stalker, M.D., F.C.C.P.  Board Certified in Internal Medicine, Pulmonary Medicine, [place], and Sleep Medicine.  [facility] Office Number: [phone]   10/19/2018

## 2018-10-20 ENCOUNTER — Encounter: Payer: Self-pay | Admitting: Internal Medicine

## 2018-10-21 ENCOUNTER — Other Ambulatory Visit: Payer: Self-pay

## 2018-10-21 MED ORDER — CONTOUR NEXT MONITOR W/DEVICE KIT: 1.0000 | PACK | Freq: Two times a day (BID) | 0 refills | Status: DC

## 2018-10-22 ENCOUNTER — Other Ambulatory Visit: Payer: Self-pay

## 2018-10-22 MED ORDER — LANCETS MISC: 1.0000 "application " | Freq: Two times a day (BID) | 11 refills | Status: DC

## 2018-10-22 MED ORDER — GLUCOSE BLOOD VI STRP: ORAL_STRIP | 12 refills | Status: DC

## 2018-10-26 ENCOUNTER — Other Ambulatory Visit: Payer: Self-pay

## 2018-10-26 MED ORDER — MICROLET LANCETS MISC: 11 refills | Status: DC

## 2018-10-28 ENCOUNTER — Other Ambulatory Visit: Payer: Self-pay | Admitting: *Deleted

## 2018-10-28 DIAGNOSIS — G4719 Other hypersomnia: Secondary | ICD-10-CM

## 2018-11-17 ENCOUNTER — Encounter: Payer: Self-pay | Admitting: Internal Medicine

## 2018-12-17 ENCOUNTER — Other Ambulatory Visit: Payer: Managed Care, Other (non HMO)

## 2018-12-21 ENCOUNTER — Encounter: Payer: Managed Care, Other (non HMO) | Admitting: Internal Medicine

## 2019-03-03 ENCOUNTER — Other Ambulatory Visit: Payer: Managed Care, Other (non HMO)

## 2019-03-10 ENCOUNTER — Encounter: Payer: Managed Care, Other (non HMO) | Admitting: Internal Medicine

## 2019-03-11 ENCOUNTER — Other Ambulatory Visit: Payer: Self-pay | Admitting: Internal Medicine

## 2019-03-28 ENCOUNTER — Encounter: Payer: Self-pay | Admitting: Internal Medicine

## 2019-03-29 ENCOUNTER — Other Ambulatory Visit: Payer: Self-pay

## 2019-03-29 MED ORDER — CITALOPRAM HYDROBROMIDE 40 MG PO TABS: ORAL_TABLET | ORAL | 1 refills | Status: DC

## 2019-04-11 ENCOUNTER — Other Ambulatory Visit: Payer: Self-pay

## 2019-04-11 MED ORDER — AMLODIPINE BESYLATE 10 MG PO TABS: ORAL_TABLET | ORAL | 5 refills | Status: DC

## 2019-05-23 ENCOUNTER — Other Ambulatory Visit: Payer: Self-pay | Admitting: Internal Medicine

## 2019-07-22 ENCOUNTER — Other Ambulatory Visit: Payer: Managed Care, Other (non HMO)

## 2019-07-28 ENCOUNTER — Encounter: Payer: Managed Care, Other (non HMO) | Admitting: Internal Medicine

## 2019-09-12 ENCOUNTER — Encounter: Payer: Self-pay | Admitting: Internal Medicine

## 2019-09-12 MED ORDER — CITALOPRAM HYDROBROMIDE 40 MG PO TABS: ORAL_TABLET | ORAL | 0 refills | Status: DC

## 2019-09-12 NOTE — Telephone Encounter (Signed)
I have sent in rx for citalopram.  She needs a f/u appt scheduled.  I have not seen her since 09/2018. Please schedule virtual visit.

## 2019-09-13 ENCOUNTER — Other Ambulatory Visit: Payer: Self-pay

## 2019-09-13 MED ORDER — CITALOPRAM HYDROBROMIDE 40 MG PO TABS: ORAL_TABLET | ORAL | 0 refills | Status: DC

## 2019-09-14 ENCOUNTER — Telehealth: Payer: Self-pay | Admitting: Internal Medicine

## 2019-09-14 ENCOUNTER — Other Ambulatory Visit: Payer: Self-pay

## 2019-09-14 MED ORDER — CITALOPRAM HYDROBROMIDE 40 MG PO TABS: ORAL_TABLET | ORAL | 1 refills | Status: DC

## 2019-09-14 NOTE — Telephone Encounter (Signed)
I have resent prescription with one refill 6 month supply.

## 2019-09-14 NOTE — Telephone Encounter (Signed)
Pt's insurance is calling in to ask if provider would like to add refills to pt's Rx for citalopram (CELEXA) 40 MG tablet    Pharmacy:  [facility] - [address], [place] [phone] (Phone) [phone] (Fax)

## 2019-09-15 NOTE — Telephone Encounter (Signed)
[place]. She has upcoming appt in March. Husband will give wife message.

## 2019-10-20 ENCOUNTER — Encounter: Payer: Self-pay | Admitting: Internal Medicine

## 2019-10-24 NOTE — Telephone Encounter (Signed)
Are you ok with me filling her protonix until her appt in March or would you prefer to move her up? No visit since 09/2018

## 2019-10-24 NOTE — Telephone Encounter (Signed)
Needs earlier appt and then ok to refill until appt.  [place] [place] for doxy.

## 2019-10-26 ENCOUNTER — Telehealth: Payer: Self-pay | Admitting: Internal Medicine

## 2019-10-26 NOTE — Telephone Encounter (Signed)
Left message for pt to call back and move up appt. Needs appt before March. Will refill for 30 days. Needs appt within those 30 days- virtual.

## 2019-10-26 NOTE — Telephone Encounter (Signed)
Pt's husband does not want her to do a virtual appt. He has questions about her medications. Please cb- thanks

## 2019-10-28 NOTE — Telephone Encounter (Signed)
I am ok to do her physical and f/u in the office if passes screening

## 2019-10-28 NOTE — Telephone Encounter (Signed)
Called and spoke with husband and pt. Advised that we are not seeing patients in the office at this time. Husband stated he was not doing a virtual visit no matter the circumstance. Stated that we costed him over $200 last year because we had his cpe and follow up appts mixed up. Husband stated that he refuses to pay for a visit that is pointless. He does not feel that it is right for our doctors to be able to charge full price for a visit when the patients do not get the same level of care or hands on exam. Advised that pt must be seen in order to continue to get her medication. I have scheduled her for an in office visit on 11/04/19. Pt and husband were told that this is subject to change. I screened them both and advised that they would be screened closer to appt. As of right now, she passes screening to come in. Discussed briefly with Dr Nicki Reaper.

## 2019-10-28 NOTE — Telephone Encounter (Signed)
Noted  

## 2019-11-03 ENCOUNTER — Encounter: Payer: Self-pay | Admitting: Internal Medicine

## 2019-11-03 ENCOUNTER — Other Ambulatory Visit: Payer: Self-pay

## 2019-11-03 ENCOUNTER — Ambulatory Visit (INDEPENDENT_AMBULATORY_CARE_PROVIDER_SITE_OTHER): Payer: Managed Care, Other (non HMO) | Admitting: Internal Medicine

## 2019-11-03 VITALS — BP 138/86 | HR 104 | Temp 96.2°F | Resp 18 | Ht 67.5 in | Wt 280.6 lb

## 2019-11-03 DIAGNOSIS — Z23 Encounter for immunization: Secondary | ICD-10-CM

## 2019-11-03 DIAGNOSIS — E7800 Pure hypercholesterolemia, unspecified: Secondary | ICD-10-CM

## 2019-11-03 DIAGNOSIS — K219 Gastro-esophageal reflux disease without esophagitis: Secondary | ICD-10-CM

## 2019-11-03 DIAGNOSIS — R739 Hyperglycemia, unspecified: Secondary | ICD-10-CM | POA: Diagnosis not present

## 2019-11-03 DIAGNOSIS — F419 Anxiety disorder, unspecified: Secondary | ICD-10-CM

## 2019-11-03 DIAGNOSIS — Z0001 Encounter for general adult medical examination with abnormal findings: Secondary | ICD-10-CM

## 2019-11-03 DIAGNOSIS — I10 Essential (primary) hypertension: Secondary | ICD-10-CM

## 2019-11-03 DIAGNOSIS — G4730 Sleep apnea, unspecified: Secondary | ICD-10-CM

## 2019-11-03 DIAGNOSIS — Z0000 Encounter for general adult medical examination without abnormal findings: Secondary | ICD-10-CM

## 2019-11-03 LAB — LIPID PANEL
Cholesterol: 180 mg/dL (ref 0–200)
HDL: 56.1 mg/dL (ref >39.00)
LDL Cholesterol: 101 mg/dL — ABNORMAL HIGH (ref 0–99)
NonHDL: 123.76
Total CHOL/HDL Ratio: 3
Triglycerides: 116 mg/dL (ref 0.0–149.0)
VLDL: 23.2 mg/dL (ref 0.0–40.0)

## 2019-11-03 LAB — HEPATIC FUNCTION PANEL
ALT: 44 U/L — ABNORMAL HIGH (ref 0–35)
AST: 42 U/L — ABNORMAL HIGH (ref 0–37)
Albumin: 5 g/dL (ref 3.5–5.2)
Alkaline Phosphatase: 64 U/L (ref 39–117)
Bilirubin, Direct: 0.2 mg/dL (ref 0.0–0.3)
Total Bilirubin: 0.7 mg/dL (ref 0.2–1.2)
Total Protein: 8.1 g/dL (ref 6.0–8.3)

## 2019-11-03 LAB — BASIC METABOLIC PANEL
BUN: 13 mg/dL (ref 6–23)
CO2: 25 mEq/L (ref 19–32)
Calcium: 10.1 mg/dL (ref 8.4–10.5)
Chloride: 100 mEq/L (ref 96–112)
Creatinine, Ser: 0.81 mg/dL (ref 0.40–1.20)
GFR: 71.97 mL/min (ref >60.00)
Glucose, Bld: 109 mg/dL — ABNORMAL HIGH (ref 70–99)
Potassium: 4.4 mEq/L (ref 3.5–5.1)
Sodium: 137 mEq/L (ref 135–145)

## 2019-11-03 LAB — CBC WITH DIFFERENTIAL/PLATELET
Basophils Absolute: 0.1 10*3/uL (ref 0.0–0.1)
Basophils Relative: 0.7 % (ref 0.0–3.0)
Eosinophils Absolute: 0.1 10*3/uL (ref 0.0–0.7)
Eosinophils Relative: 1.2 % (ref 0.0–5.0)
HCT: 45.7 % (ref 36.0–46.0)
Hemoglobin: 15.3 g/dL — ABNORMAL HIGH (ref 12.0–15.0)
Lymphocytes Relative: 22.5 % (ref 12.0–46.0)
Lymphs Abs: 2.6 10*3/uL (ref 0.7–4.0)
MCHC: 33.4 g/dL (ref 30.0–36.0)
MCV: 94.5 fl (ref 78.0–100.0)
Monocytes Absolute: 0.7 10*3/uL (ref 0.1–1.0)
Monocytes Relative: 6.2 % (ref 3.0–12.0)
Neutro Abs: 7.9 10*3/uL — ABNORMAL HIGH (ref 1.4–7.7)
Neutrophils Relative %: 69.4 % (ref 43.0–77.0)
Platelets: 275 10*3/uL (ref 150.0–400.0)
RBC: 4.84 Mil/uL (ref 3.87–5.11)
RDW: 13.6 % (ref 11.5–15.5)
WBC: 11.4 10*3/uL — ABNORMAL HIGH (ref 4.0–10.5)

## 2019-11-03 LAB — TSH: TSH: 1.77 u[IU]/mL (ref 0.35–4.50)

## 2019-11-03 LAB — HEMOGLOBIN A1C: Hgb A1c MFr Bld: 5.8 % (ref 4.6–6.5)

## 2019-11-03 MED ORDER — BUSPIRONE HCL 5 MG PO TABS: 5.0000 mg | ORAL_TABLET | Freq: Every day | ORAL | 1 refills | Status: DC | PRN

## 2019-11-03 NOTE — Progress Notes (Addendum)
Patient ID: FINLAY MILLS, female   DOB: 11-Jun-1961, 59 y.o.   MRN: [id number]   Subjective:    Patient ID: DEBARAH MCCUMBERS, female    DOB: April 14, 1961, 59 y.o.   MRN: [id number]  HPI  This visit occurred during the SARS-CoV-2 public health emergency.  Safety protocols were in place, including screening questions prior to the visit, additional usage of staff PPE, and extensive cleaning of exam room while observing appropriate contact time as indicated for disinfecting solutions.  Patient here for her physical exam.  She reports increased anxiety and stress - especially related to covid.  Some family stress as well.  Anxious about coming in for visit.  Discussed anxiety issues with her today.  On citalopram.  Feels she is doing well on this medication.  Discussed addition of buspar.  She is agreeable.   Discussed if need for counseling, etc.  She does not feel needs any further intervention.  Has good support (with her husband).  No chest pain.  Breathing stable.  No acid reflux reported.  No abdominal pain or bowel change reported.    Past Medical History:  Diagnosis Date  . Allergy   . Anemia   . Cancer ([place])   . Depression   . Diverticulosis   . FHx: migraine headaches   . GERD (gastroesophageal reflux disease)   . History of chickenpox   . Hypercholesterolemia   . Hyperlipidemia   . Hypertension   . Osteoarthritis   . Panic attacks   . Sleep apnea    Past Surgical History:  Procedure Laterality Date  . ABDOMINAL HYSTERECTOMY    . CATARACT EXTRACTION W/PHACO Left 08/11/2017   Procedure: CATARACT EXTRACTION PHACO AND INTRAOCULAR LENS PLACEMENT ([facility]);  Surgeon: Birder Robson, MD;  Location: [facility];  Service: Ophthalmology;  Laterality: Left;  [place] 00:31 AP% 19.2 CDE 6.13 Fluid Pack lot # [id number]  . CATARACT EXTRACTION W/PHACO Right 09/01/2017   Procedure: CATARACT EXTRACTION PHACO AND INTRAOCULAR LENS PLACEMENT ([place]);  Surgeon: Birder Robson, MD;  Location: [facility];   Service: Ophthalmology;  Laterality: Right;  [place] 00:30 AP% 11.7 CDE3.52 fluid pack lot #[id number]  . Child Birth  36 and 49  . COLONOSCOPY    . COLONOSCOPY WITH PROPOFOL N/A 08/25/2018   Procedure: COLONOSCOPY WITH PROPOFOL;  Surgeon: Toledo, Benay Pike, MD;  Location: [facility];  Service: Gastroenterology;  Laterality: N/A;  . ESOPHAGOGASTRODUODENOSCOPY    . ESOPHAGOGASTRODUODENOSCOPY (EGD) WITH PROPOFOL N/A 08/25/2018   Procedure: ESOPHAGOGASTRODUODENOSCOPY (EGD) WITH PROPOFOL;  Surgeon: Toledo, Benay Pike, MD;  Location: [facility];  Service: Gastroenterology;  Laterality: N/A;  . FRACTURE SURGERY    . LAPAROSCOPIC SUPRACERVICAL HYSTERECTOMY  2006   ovaries left in place  . left elbow    . left knee    . Miscarriage  1987  . TONSILLECTOMY  1963  . TUBAL LIGATION  1993   Family History  Problem Relation Age of Onset  . Heart disease Mother        Incomplete heart block  . Hypertension Mother   . Diabetes Mother   . Nephrolithiasis Mother   . Migraines Mother   . Arthritis Mother        degenerative-back,osteoporosis  . Heart failure Mother   . Heart disease Father 86       Myocardial infarction  . Heart failure Father   . Heart disease Sister        h/o MI  . Migraines Sister    Social History  Socioeconomic History  . Marital status: Married    Spouse name: Not on file  . Number of children: Not on file  . Years of education: Not on file  . Highest education level: Not on file  Occupational History  . Not on file  Tobacco Use  . Smoking status: Former Smoker    Packs/day: 0.10    Years: 1.50    Pack years: 0.15  . Smokeless tobacco: Never Used  Substance and Sexual Activity  . Alcohol use: No  . Drug use: No  . Sexual activity: Not on file  Other Topics Concern  . Not on file  Social History Narrative  . Not on file   Social Determinants of Health   Financial Resource Strain:   . Difficulty of Paying Living Expenses: Not on file  Food  Insecurity:   . Worried About Charity fundraiser in the Last Year: Not on file  . Ran Out of Food in the Last Year: Not on file  Transportation Needs:   . Lack of Transportation (Medical): Not on file  . Lack of Transportation (Non-Medical): Not on file  Physical Activity:   . Days of Exercise per Week: Not on file  . Minutes of Exercise per Session: Not on file  Stress:   . Feeling of Stress : Not on file  Social Connections:   . Frequency of Communication with Friends and Family: Not on file  . Frequency of Social Gatherings with Friends and Family: Not on file  . Attends Religious Services: Not on file  . Active Member of Clubs or Organizations: Not on file  . Attends Archivist Meetings: Not on file  . Marital Status: Not on file     Review of Systems  Constitutional: Negative for appetite change and unexpected weight change.  HENT: Negative for sinus pressure.        Minimal sinus congestion/drainage.   Eyes: Negative for pain and visual disturbance.  Respiratory: Negative for cough, chest tightness and shortness of breath.        No chest congestion.   Cardiovascular: Negative for chest pain, palpitations and leg swelling.  Gastrointestinal: Negative for abdominal pain, constipation, diarrhea and vomiting.  Genitourinary: Negative for difficulty urinating and dysuria.  Musculoskeletal: Negative for back pain, joint swelling and myalgias.  Skin: Negative for color change and rash.  Neurological: Negative for dizziness, light-headedness and headaches.  Hematological: Negative for adenopathy. Does not bruise/bleed easily.  Psychiatric/Behavioral: Negative for agitation and dysphoric mood.       Objective:    Physical Exam Constitutional:      General: She is not in acute distress.    Appearance: Normal appearance. She is well-developed.  HENT:     Head: Normocephalic.     Nose: Nose normal.  Eyes:     General: No scleral icterus.       Right eye: No  discharge.        Left eye: No discharge.  Neck:     Thyroid: No thyromegaly.  Cardiovascular:     Rate and Rhythm: Normal rate and regular rhythm.  Pulmonary:     Effort: No tachypnea, accessory muscle usage or respiratory distress.     Breath sounds: Normal breath sounds. No decreased breath sounds or wheezing.  Chest:     Breasts:        Right: No inverted nipple, mass, nipple discharge or tenderness (no axillary adenopathy).        Left: No  inverted nipple, mass, nipple discharge or tenderness (no axilarry adenopathy).  Abdominal:     General: Bowel sounds are normal.     Palpations: Abdomen is soft.     Tenderness: There is no abdominal tenderness.  Musculoskeletal:        General: No swelling or tenderness.     Cervical back: Neck supple.  Lymphadenopathy:     Cervical: No cervical adenopathy.  Skin:    Findings: No erythema or rash.  Neurological:     Mental Status: She is alert and oriented to person, place, and time.  Psychiatric:        Mood and Affect: Mood normal.        Behavior: Behavior normal.     BP 138/86   Pulse (!) 104   Temp (!) 96.2 F (35.7 C) (Temporal)   Resp 18   Ht 5' 7.5" (1.715 m)   Wt 280 lb 9.6 oz (127.3 kg)   SpO2 95%   BMI 43.30 kg/m  Wt Readings from Last 3 Encounters:  11/03/19 280 lb 9.6 oz (127.3 kg)  10/19/18 274 lb 3.2 oz (124.4 kg)  09/30/18 273 lb 9.6 oz (124.1 kg)     Lab Results  Component Value Date   WBC 11.4 (H) 11/03/2019   HGB 15.3 (H) 11/03/2019   HCT 45.7 11/03/2019   PLT 275.0 11/03/2019   GLUCOSE 109 (H) 11/03/2019   CHOL 180 11/03/2019   TRIG 116.0 11/03/2019   HDL 56.10 11/03/2019   LDLCALC 101 (H) 11/03/2019   ALT 44 (H) 11/03/2019   AST 42 (H) 11/03/2019   NA 137 11/03/2019   K 4.4 11/03/2019   CL 100 11/03/2019   CREATININE 0.81 11/03/2019   BUN 13 11/03/2019   CO2 25 11/03/2019   TSH 1.77 11/03/2019   HGBA1C 5.8 11/03/2019    MM 3D SCREEN BREAST BILATERAL  Result Date:  09/20/2018 CLINICAL DATA:  Screening. EXAM: DIGITAL SCREENING BILATERAL MAMMOGRAM WITH TOMO AND CAD COMPARISON:  Previous exam(s). ACR Breast Density Category a: The breast tissue is almost entirely fatty. FINDINGS: There are no findings suspicious for malignancy. Images were processed with CAD. IMPRESSION: No mammographic evidence of malignancy. A result letter of this screening mammogram will be mailed directly to the patient. RECOMMENDATION: Screening mammogram in one year. (Code:SM-B-01Y) BI-RADS CATEGORY  1: Negative. Electronically Signed   By: Franki Cabot M.D.   On: 09/20/2018 12:58       Assessment & Plan:   Problem List Items Addressed This Visit    Anxiety    On celexa.  Increased anxiety as outlined.  Feels celexa works well for her.  Does feel needs a little something more to help level things out.  Will start buspar 10m as directed.  Follow.  Update me on how she is doing.        Relevant Medications   busPIRone (BUSPAR) 5 MG tablet   Essential hypertension, benign - Primary    Blood pressure as outlined.  Have her spot check her pressure.  Send in readings.  Continue current medication regimen for now.  Follow pressures.  Follow metabolic panel.        Relevant Medications   lisinopril (ZESTRIL) 40 MG tablet   amLODipine (NORVASC) 10 MG tablet   atorvastatin (LIPITOR) 20 MG tablet   Other Relevant Orders   CBC with Differential/Platelet (Completed)   TSH (Completed)   Basic metabolic panel (Completed)   GERD (gastroesophageal reflux disease)    Continue protonix.  Follow.       Healthcare maintenance    Physical today 11/03/19.  Mammogram 09/20/18 - birads I.  Will notify when agreeable for f/u mammogram.  Colonoscopy 08/2018 - internal hemorrhoids.  Recommended f/u in 10 years.  Discussed diet and exercise and weight loss.        Hypercholesterolemia    On lipitor.  Low cholesterol diet and exercise.  Follow lipid panel and liver function tests.        Relevant  Medications   lisinopril (ZESTRIL) 40 MG tablet   amLODipine (NORVASC) 10 MG tablet   atorvastatin (LIPITOR) 20 MG tablet   Other Relevant Orders   Hepatic function panel (Completed)   Lipid panel (Completed)   Hyperglycemia    Low carb diet and exercise.  Follow met b and a1c.       Relevant Orders   Hemoglobin A1c (Completed)   Sleep apnea    Other Visit Diagnoses    Need for immunization against influenza       Relevant Orders   Flu Vaccine QUAD 36+ mos IM (Completed)       Einar Pheasant, MD

## 2019-11-04 ENCOUNTER — Other Ambulatory Visit: Payer: Self-pay

## 2019-11-04 ENCOUNTER — Telehealth: Payer: Self-pay | Admitting: Internal Medicine

## 2019-11-04 ENCOUNTER — Other Ambulatory Visit: Payer: Self-pay | Admitting: Internal Medicine

## 2019-11-04 ENCOUNTER — Ambulatory Visit: Payer: Managed Care, Other (non HMO) | Admitting: Internal Medicine

## 2019-11-04 DIAGNOSIS — R7989 Other specified abnormal findings of blood chemistry: Secondary | ICD-10-CM

## 2019-11-04 DIAGNOSIS — D72829 Elevated white blood cell count, unspecified: Secondary | ICD-10-CM

## 2019-11-04 DIAGNOSIS — R945 Abnormal results of liver function studies: Secondary | ICD-10-CM

## 2019-11-04 MED ORDER — PANTOPRAZOLE SODIUM 40 MG PO TBEC: 40.0000 mg | DELAYED_RELEASE_TABLET | Freq: Every day | ORAL | 0 refills | Status: DC

## 2019-11-04 NOTE — Telephone Encounter (Signed)
Pt needs pantoprazole (PROTONIX) 40 MG tablet sent mail order instead of local because of price difference. [facility] with [employer] order

## 2019-11-04 NOTE — Progress Notes (Signed)
Order placed for f/u labs.  

## 2019-11-04 NOTE — Telephone Encounter (Signed)
Rx refilled.

## 2019-11-05 ENCOUNTER — Encounter: Payer: Self-pay | Admitting: Internal Medicine

## 2019-11-05 MED ORDER — ATORVASTATIN CALCIUM 20 MG PO TABS: 20.0000 mg | ORAL_TABLET | Freq: Every day | ORAL | 1 refills | Status: DC

## 2019-11-05 MED ORDER — LISINOPRIL 40 MG PO TABS: 40.0000 mg | ORAL_TABLET | Freq: Every day | ORAL | 3 refills | Status: DC

## 2019-11-05 MED ORDER — AMLODIPINE BESYLATE 10 MG PO TABS: ORAL_TABLET | ORAL | 5 refills | Status: DC

## 2019-11-05 NOTE — Assessment & Plan Note (Signed)
On lipitor.  Low cholesterol diet and exercise.  Follow lipid panel and liver function tests.   

## 2019-11-05 NOTE — Assessment & Plan Note (Signed)
On celexa.  Increased anxiety as outlined.  Feels celexa works well for her.  Does feel needs a little something more to help level things out.  Will start buspar 5mg  as directed.  Follow.  Update me on how she is doing.

## 2019-11-05 NOTE — Assessment & Plan Note (Signed)
Blood pressure as outlined.  Have her spot check her pressure.  Send in readings.  Continue current medication regimen for now.  Follow pressures.  Follow metabolic panel.

## 2019-11-05 NOTE — Assessment & Plan Note (Addendum)
Physical today 11/03/19.  Mammogram 09/20/18 - birads I.  Will notify when agreeable for f/u mammogram.  Colonoscopy 08/2018 - internal hemorrhoids.  Recommended f/u in 10 years.  Discussed diet and exercise and weight loss.

## 2019-11-05 NOTE — Assessment & Plan Note (Signed)
Low carb diet and exercise.  Follow met b and a1c.  

## 2019-11-05 NOTE — Assessment & Plan Note (Signed)
Continue protonix.  Follow.   

## 2019-11-08 ENCOUNTER — Encounter: Payer: Self-pay | Admitting: *Deleted

## 2019-11-08 ENCOUNTER — Telehealth: Payer: Self-pay | Admitting: Lab

## 2019-11-08 NOTE — Telephone Encounter (Signed)
Called Pt No answer left a VM to call office

## 2019-11-11 ENCOUNTER — Encounter: Payer: Self-pay | Admitting: Internal Medicine

## 2019-11-21 ENCOUNTER — Encounter: Payer: Self-pay | Admitting: Internal Medicine

## 2019-11-21 ENCOUNTER — Other Ambulatory Visit: Payer: Self-pay

## 2019-11-21 MED ORDER — SPIRONOLACTONE 25 MG PO TABS: 12.5000 mg | ORAL_TABLET | Freq: Every day | ORAL | 0 refills | Status: DC

## 2019-11-21 NOTE — Telephone Encounter (Signed)
LMTCB

## 2019-11-21 NOTE — Telephone Encounter (Signed)
Spoke with husband and sent in rx. Patients wife is supposed to call me back to schedule lab appt. Advised that these labs need to be done within 10-14 days. Husband gave verbal understanding.

## 2019-11-21 NOTE — Telephone Encounter (Signed)
Given that the blood pressures are remaining elevated, I would like to add spironolactone 25mg  take 1/2 tablet q day.  Given starting this medication, she will need a non fasting lab checked in 10-14 days to check potassium and kidney function.  If agreeable, may send in rx, but only send in for 30 days to confirm she comes in for lab.

## 2019-11-22 NOTE — Telephone Encounter (Signed)
Pt called back. Labs scheduled.

## 2019-11-29 ENCOUNTER — Other Ambulatory Visit (INDEPENDENT_AMBULATORY_CARE_PROVIDER_SITE_OTHER): Payer: Managed Care, Other (non HMO)

## 2019-11-29 ENCOUNTER — Other Ambulatory Visit: Payer: Self-pay

## 2019-11-29 DIAGNOSIS — R7989 Other specified abnormal findings of blood chemistry: Secondary | ICD-10-CM

## 2019-11-29 DIAGNOSIS — R945 Abnormal results of liver function studies: Secondary | ICD-10-CM

## 2019-11-29 DIAGNOSIS — D72829 Elevated white blood cell count, unspecified: Secondary | ICD-10-CM

## 2019-11-30 ENCOUNTER — Encounter: Payer: Self-pay | Admitting: Internal Medicine

## 2019-11-30 LAB — CBC WITH DIFFERENTIAL/PLATELET
Basophils Absolute: 0.1 10*3/uL (ref 0.0–0.1)
Basophils Relative: 0.9 % (ref 0.0–3.0)
Eosinophils Absolute: 0.1 10*3/uL (ref 0.0–0.7)
Eosinophils Relative: 1.1 % (ref 0.0–5.0)
HCT: 43.7 % (ref 36.0–46.0)
Hemoglobin: 14.7 g/dL (ref 12.0–15.0)
Lymphocytes Relative: 30.2 % (ref 12.0–46.0)
Lymphs Abs: 3.4 10*3/uL (ref 0.7–4.0)
MCHC: 33.6 g/dL (ref 30.0–36.0)
MCV: 94.1 fl (ref 78.0–100.0)
Monocytes Absolute: 0.7 10*3/uL (ref 0.1–1.0)
Monocytes Relative: 6.6 % (ref 3.0–12.0)
Neutro Abs: 6.9 10*3/uL (ref 1.4–7.7)
Neutrophils Relative %: 61.2 % (ref 43.0–77.0)
Platelets: 239 10*3/uL (ref 150.0–400.0)
RBC: 4.64 Mil/uL (ref 3.87–5.11)
RDW: 13.4 % (ref 11.5–15.5)
WBC: 11.3 10*3/uL — ABNORMAL HIGH (ref 4.0–10.5)

## 2019-11-30 LAB — HEPATIC FUNCTION PANEL
ALT: 46 U/L — ABNORMAL HIGH (ref 0–35)
AST: 44 U/L — ABNORMAL HIGH (ref 0–37)
Albumin: 4.7 g/dL (ref 3.5–5.2)
Alkaline Phosphatase: 57 U/L (ref 39–117)
Bilirubin, Direct: 0.1 mg/dL (ref 0.0–0.3)
Total Bilirubin: 0.5 mg/dL (ref 0.2–1.2)
Total Protein: 8 g/dL (ref 6.0–8.3)

## 2019-12-01 ENCOUNTER — Other Ambulatory Visit: Payer: Managed Care, Other (non HMO)

## 2019-12-07 ENCOUNTER — Other Ambulatory Visit: Payer: Self-pay

## 2019-12-07 ENCOUNTER — Encounter: Payer: Self-pay | Admitting: Internal Medicine

## 2019-12-07 DIAGNOSIS — I10 Essential (primary) hypertension: Secondary | ICD-10-CM

## 2019-12-07 MED ORDER — CITALOPRAM HYDROBROMIDE 40 MG PO TABS: ORAL_TABLET | ORAL | 1 refills | Status: DC

## 2019-12-07 MED ORDER — SPIRONOLACTONE 25 MG PO TABS: 12.5000 mg | ORAL_TABLET | Freq: Every day | ORAL | 1 refills | Status: DC

## 2019-12-07 MED ORDER — MICROLET LANCETS MISC: 11 refills | Status: DC

## 2019-12-07 NOTE — Telephone Encounter (Signed)
I have sent in her prescriptions. See BP readings below.

## 2019-12-07 NOTE — Telephone Encounter (Signed)
Given blood pressures are still elevated above goal, I would like to increase spironolactone.  Confirm taking 75m 1/2 tablet q day (spironolactone).  If correct, then change to 219mq day.  Recheck met b in 10-14 days.

## 2019-12-08 ENCOUNTER — Other Ambulatory Visit: Payer: Self-pay

## 2019-12-08 MED ORDER — SPIRONOLACTONE 25 MG PO TABS: 25.0000 mg | ORAL_TABLET | Freq: Every day | ORAL | 1 refills | Status: DC

## 2019-12-08 NOTE — Telephone Encounter (Signed)
Spoke with pts husband. Increased medication and scheduled for follow up labs.

## 2019-12-14 ENCOUNTER — Encounter: Payer: Self-pay | Admitting: Internal Medicine

## 2019-12-18 ENCOUNTER — Encounter: Payer: Self-pay | Admitting: Internal Medicine

## 2019-12-19 NOTE — Telephone Encounter (Signed)
Blood pressures look much better.  Needs a f/u met b - she has not come in for this lab.  Also, per last note, was to let us know about f/u ultrasound

## 2019-12-21 ENCOUNTER — Other Ambulatory Visit (INDEPENDENT_AMBULATORY_CARE_PROVIDER_SITE_OTHER): Payer: Managed Care, Other (non HMO)

## 2019-12-21 ENCOUNTER — Other Ambulatory Visit: Payer: Self-pay

## 2019-12-21 DIAGNOSIS — I10 Essential (primary) hypertension: Secondary | ICD-10-CM | POA: Diagnosis not present

## 2019-12-22 LAB — BASIC METABOLIC PANEL
BUN: 10 mg/dL (ref 6–23)
CO2: 27 mEq/L (ref 19–32)
Calcium: 9.9 mg/dL (ref 8.4–10.5)
Chloride: 102 mEq/L (ref 96–112)
Creatinine, Ser: 0.76 mg/dL (ref 0.40–1.20)
GFR: 77.42 mL/min (ref >60.00)
Glucose, Bld: 114 mg/dL — ABNORMAL HIGH (ref 70–99)
Potassium: 4.1 mEq/L (ref 3.5–5.1)
Sodium: 137 mEq/L (ref 135–145)

## 2019-12-26 ENCOUNTER — Encounter: Payer: Managed Care, Other (non HMO) | Admitting: Internal Medicine

## 2019-12-26 ENCOUNTER — Encounter: Payer: Self-pay | Admitting: Internal Medicine

## 2020-01-30 ENCOUNTER — Encounter: Payer: Self-pay | Admitting: Internal Medicine

## 2020-01-31 MED ORDER — PANTOPRAZOLE SODIUM 40 MG PO TBEC: 40.0000 mg | DELAYED_RELEASE_TABLET | Freq: Every day | ORAL | 0 refills | Status: DC

## 2020-02-03 ENCOUNTER — Ambulatory Visit (INDEPENDENT_AMBULATORY_CARE_PROVIDER_SITE_OTHER): Payer: Managed Care, Other (non HMO) | Admitting: Internal Medicine

## 2020-02-03 ENCOUNTER — Encounter: Payer: Self-pay | Admitting: Internal Medicine

## 2020-02-03 ENCOUNTER — Other Ambulatory Visit: Payer: Self-pay

## 2020-02-03 DIAGNOSIS — I10 Essential (primary) hypertension: Secondary | ICD-10-CM

## 2020-02-03 DIAGNOSIS — R739 Hyperglycemia, unspecified: Secondary | ICD-10-CM | POA: Diagnosis not present

## 2020-02-03 DIAGNOSIS — K219 Gastro-esophageal reflux disease without esophagitis: Secondary | ICD-10-CM

## 2020-02-03 DIAGNOSIS — E7800 Pure hypercholesterolemia, unspecified: Secondary | ICD-10-CM | POA: Diagnosis not present

## 2020-02-03 DIAGNOSIS — F419 Anxiety disorder, unspecified: Secondary | ICD-10-CM

## 2020-02-03 DIAGNOSIS — G4730 Sleep apnea, unspecified: Secondary | ICD-10-CM

## 2020-02-03 MED ORDER — BUSPIRONE HCL 5 MG PO TABS: 5.0000 mg | ORAL_TABLET | Freq: Every day | ORAL | 0 refills | Status: DC | PRN

## 2020-02-03 NOTE — Progress Notes (Signed)
[name], female   DOB: 1961/07/27, 59 y.o.   MRN: [id number]   Subjective:    [name], female    DOB: 1961/02/01, 59 y.o.   MRN: [id number]  HPI This visit occurred during the SARS-CoV-2 public health emergency.  Safety protocols were in place, including screening questions prior to the visit, additional usage of staff PPE, and extensive cleaning of exam room while observing appropriate contact time as indicated for disinfecting solutions.  Patient here for a scheduled follow up.  She is doing better. Feels better.  On citalopram and buspar.  Doing well on this regimen.  No chest pain or sob.  No acid reflux reported.  No abdominal pain.  Bowels moving.  One month ago - right anterior knee pain.  Better now.  Reviewed outside blood pressures.  Blood pressures averaging 120-130/70s.     Past Medical History:  Diagnosis Date  . Allergy   . Anemia   . Cancer ([place])   . Depression   . Diverticulosis   . FHx: migraine headaches   . GERD (gastroesophageal reflux disease)   . History of chickenpox   . Hypercholesterolemia   . Hyperlipidemia   . Hypertension   . Osteoarthritis   . Panic attacks   . Sleep apnea    Past Surgical History:  Procedure Laterality Date  . ABDOMINAL HYSTERECTOMY    . CATARACT EXTRACTION W/PHACO Left 08/11/2017   Procedure: CATARACT EXTRACTION PHACO AND INTRAOCULAR LENS PLACEMENT ([facility]);  Surgeon: Birder Robson, MD;  Location: [facility];  Service: Ophthalmology;  Laterality: Left;  [place] 00:31 AP% 19.2 CDE 6.13 Fluid Pack lot # [id number]  . CATARACT EXTRACTION W/PHACO Right 09/01/2017   Procedure: CATARACT EXTRACTION PHACO AND INTRAOCULAR LENS PLACEMENT ([place]);  Surgeon: Birder Robson, MD;  Location: [facility];  Service: Ophthalmology;  Laterality: Right;  [place] 00:30 AP% 11.7 CDE3.52 fluid pack lot #[id number]  . Child Birth  80 and 45  . COLONOSCOPY    . COLONOSCOPY WITH PROPOFOL N/A 08/25/2018   Procedure: COLONOSCOPY  WITH PROPOFOL;  Surgeon: Toledo, Benay Pike, MD;  Location: [facility];  Service: Gastroenterology;  Laterality: N/A;  . ESOPHAGOGASTRODUODENOSCOPY    . ESOPHAGOGASTRODUODENOSCOPY (EGD) WITH PROPOFOL N/A 08/25/2018   Procedure: ESOPHAGOGASTRODUODENOSCOPY (EGD) WITH PROPOFOL;  Surgeon: Toledo, Benay Pike, MD;  Location: [facility];  Service: Gastroenterology;  Laterality: N/A;  . FRACTURE SURGERY    . LAPAROSCOPIC SUPRACERVICAL HYSTERECTOMY  2006   ovaries left in place  . left elbow    . left knee    . Miscarriage  1987  . TONSILLECTOMY  1963  . TUBAL LIGATION  1993   Family History  Problem Relation Age of Onset  . Heart disease Mother        Incomplete heart block  . Hypertension Mother   . Diabetes Mother   . Nephrolithiasis Mother   . Migraines Mother   . Arthritis Mother        degenerative-back,osteoporosis  . Heart failure Mother   . Heart disease Father 19       Myocardial infarction  . Heart failure Father   . Heart disease Sister        h/o MI  . Migraines Sister    Social History   Socioeconomic History  . Marital status: Married    Spouse name: Not on file  . Number of children: Not on file  . Years of education: Not on file  . Highest education level: Not  on file  Occupational History  . Not on file  Tobacco Use  . Smoking status: Former Smoker    Packs/day: 0.10    Years: 1.50    Pack years: 0.15  . Smokeless tobacco: Never Used  Substance and Sexual Activity  . Alcohol use: No  . Drug use: No  . Sexual activity: Not on file  Other Topics Concern  . Not on file  Social History Narrative  . Not on file   Social Determinants of Health   Financial Resource Strain:   . Difficulty of Paying Living Expenses:   Food Insecurity:   . Worried About Charity fundraiser in the Last Year:   . Arboriculturist in the Last Year:   Transportation Needs:   . Film/video editor (Medical):   Marland Kitchen Lack of Transportation (Non-Medical):   Physical  Activity:   . Days of Exercise per Week:   . Minutes of Exercise per Session:   Stress:   . Feeling of Stress :   Social Connections:   . Frequency of Communication with Friends and Family:   . Frequency of Social Gatherings with Friends and Family:   . Attends Religious Services:   . Active Member of Clubs or Organizations:   . Attends Archivist Meetings:   Marland Kitchen Marital Status:     Outpatient Encounter Medications as of 02/03/2020  Medication Sig  . acetaminophen (TYLENOL) 650 MG CR tablet Take 650 mg by mouth every 8 (eight) hours as needed for pain.  Marland Kitchen amLODipine (NORVASC) 10 MG tablet TAKE 1 TABLET(10 MG) BY MOUTH DAILY  . atorvastatin (LIPITOR) 20 MG tablet Take 1 tablet (20 mg total) by mouth daily.  . Blood Glucose Monitoring Suppl (CONTOUR NEXT MONITOR) w/Device KIT 1 kit by Does not apply route 2 (two) times daily. DX: E11.9  . busPIRone (BUSPAR) 5 MG tablet Take 1 tablet (5 mg total) by mouth daily as needed.  . citalopram (CELEXA) 40 MG tablet TAKE 1 TABLET BY MOUTH DAILY. GENERIC EQUIVALENT FOR CELEXA  . glucose blood (CONTOUR NEXT TEST) test strip Use as instructed to check blood sugars twice daily Dx E11.9  . lisinopril (ZESTRIL) 40 MG tablet Take 1 tablet (40 mg total) by mouth daily.  . Microlet Lancets MISC Use as directed to check sugars twice daily. Dx E11.9  . ondansetron (ZOFRAN) 4 MG tablet Take 4 mg by mouth every 8 (eight) hours as needed for nausea or vomiting.  . pantoprazole (PROTONIX) 40 MG tablet Take 1 tablet (40 mg total) by mouth daily.  Marland Kitchen spironolactone (ALDACTONE) 25 MG tablet Take 1 tablet (25 mg total) by mouth daily.  . [DISCONTINUED] busPIRone (BUSPAR) 5 MG tablet Take 1 tablet (5 mg total) by mouth daily as needed.   No facility-administered encounter medications on file as of 02/03/2020.    Review of Systems  Constitutional: Negative for appetite change and unexpected weight change.  HENT: Negative for congestion and sinus pressure.     Respiratory: Negative for cough, chest tightness and shortness of breath.   Cardiovascular: Negative for chest pain, palpitations and leg swelling.  Gastrointestinal: Negative for abdominal pain, diarrhea, nausea and vomiting.  Genitourinary: Negative for difficulty urinating and dysuria.  Musculoskeletal: Negative for joint swelling and myalgias.  Skin: Negative for color change and rash.  Neurological: Negative for dizziness, light-headedness and headaches.  Psychiatric/Behavioral: Negative for agitation and dysphoric mood.       Objective:    Physical Exam Vitals  reviewed.  Constitutional:      General: She is not in acute distress.    Appearance: Normal appearance.  HENT:     Head: Normocephalic and atraumatic.     Right Ear: External ear normal.     Left Ear: External ear normal.  Eyes:     General: No scleral icterus.       Right eye: No discharge.        Left eye: No discharge.     Conjunctiva/sclera: Conjunctivae normal.  Neck:     Thyroid: No thyromegaly.  Cardiovascular:     Rate and Rhythm: Normal rate and regular rhythm.  Pulmonary:     Effort: No respiratory distress.     Breath sounds: Normal breath sounds. No wheezing.  Abdominal:     General: Bowel sounds are normal.     Palpations: Abdomen is soft.     Tenderness: There is no abdominal tenderness.  Musculoskeletal:        General: No swelling or tenderness.     Cervical back: Neck supple. No tenderness.  Lymphadenopathy:     Cervical: No cervical adenopathy.  Skin:    Findings: No erythema or rash.  Neurological:     Mental Status: She is alert.  Psychiatric:        Mood and Affect: Mood normal.        Behavior: Behavior normal.     BP 124/76   Pulse 100   Temp (!) 96.8 F (36 C)   Resp 16   Ht 5' 8" (1.727 m)   Wt 281 lb (127.5 kg)   SpO2 98%   BMI 42.73 kg/m  Wt Readings from Last 3 Encounters:  02/03/20 281 lb (127.5 kg)  11/03/19 280 lb 9.6 oz (127.3 kg)  10/19/18 274 lb 3.2 oz  (124.4 kg)     Lab Results  Component Value Date   WBC 11.3 (H) 11/29/2019   HGB 14.7 11/29/2019   HCT 43.7 11/29/2019   PLT 239.0 11/29/2019   GLUCOSE 114 (H) 12/21/2019   CHOL 180 11/03/2019   TRIG 116.0 11/03/2019   HDL 56.10 11/03/2019   LDLCALC 101 (H) 11/03/2019   ALT 46 (H) 11/29/2019   AST 44 (H) 11/29/2019   NA 137 12/21/2019   K 4.1 12/21/2019   CL 102 12/21/2019   CREATININE 0.76 12/21/2019   BUN 10 12/21/2019   CO2 27 12/21/2019   TSH 1.77 11/03/2019   HGBA1C 5.8 11/03/2019    MM 3D SCREEN BREAST BILATERAL  Result Date: 09/20/2018 CLINICAL DATA:  Screening. EXAM: DIGITAL SCREENING BILATERAL MAMMOGRAM WITH TOMO AND CAD COMPARISON:  Previous exam(s). ACR Breast Density Category a: The breast tissue is almost entirely fatty. FINDINGS: There are no findings suspicious for malignancy. Images were processed with CAD. IMPRESSION: No mammographic evidence of malignancy. A result letter of this screening mammogram will be mailed directly to the patient. RECOMMENDATION: Screening mammogram in one year. (Code:SM-B-01Y) BI-RADS CATEGORY  1: Negative. Electronically Signed   By: Franki Cabot M.D.   On: 09/20/2018 12:58       Assessment & Plan:   Problem List Items Addressed This Visit    Anxiety    Doing better on citalopram and buspar.        Relevant Medications   busPIRone (BUSPAR) 5 MG tablet   Essential hypertension, benign    Blood pressure doing better.  Continue amlodipine, lisinopril and aldactone.  Follow pressures.  Follow metabolic panel.  Relevant Orders   CBC with Differential/Platelet   Basic metabolic panel   GERD (gastroesophageal reflux disease)    Controlled on protonix.  Follow.        Hypercholesterolemia    On lipitor.  Low cholesterol diet and exercise.  Follow lipid panel and liver function tests.        Relevant Orders   Hepatic function panel   Lipid panel   Hyperglycemia    Low carb diet and exercise.  Follow met b and a1c.        Relevant Orders   Hemoglobin A1c   Sleep apnea    Continue CPAP.           Einar Pheasant, MD

## 2020-02-11 ENCOUNTER — Encounter: Payer: Self-pay | Admitting: Internal Medicine

## 2020-02-11 NOTE — Assessment & Plan Note (Signed)
Low carb diet and exercise.  Follow met b and a1c.  

## 2020-02-11 NOTE — Assessment & Plan Note (Signed)
Doing better on citalopram and buspar.

## 2020-02-11 NOTE — Assessment & Plan Note (Signed)
Controlled on protonix.  Follow.   

## 2020-02-11 NOTE — Assessment & Plan Note (Signed)
On lipitor.  Low cholesterol diet and exercise.  Follow lipid panel and liver function tests.   

## 2020-02-11 NOTE — Assessment & Plan Note (Signed)
Continue CPAP.  

## 2020-02-11 NOTE — Assessment & Plan Note (Signed)
Blood pressure doing better.  Continue amlodipine, lisinopril and aldactone.  Follow pressures.  Follow metabolic panel.

## 2020-02-17 ENCOUNTER — Other Ambulatory Visit: Payer: Self-pay

## 2020-02-17 ENCOUNTER — Other Ambulatory Visit (INDEPENDENT_AMBULATORY_CARE_PROVIDER_SITE_OTHER): Payer: Managed Care, Other (non HMO)

## 2020-02-17 DIAGNOSIS — E7800 Pure hypercholesterolemia, unspecified: Secondary | ICD-10-CM

## 2020-02-17 DIAGNOSIS — R739 Hyperglycemia, unspecified: Secondary | ICD-10-CM

## 2020-02-17 DIAGNOSIS — I10 Essential (primary) hypertension: Secondary | ICD-10-CM | POA: Diagnosis not present

## 2020-02-17 LAB — CBC WITH DIFFERENTIAL/PLATELET
Basophils Absolute: 0.1 10*3/uL (ref 0.0–0.1)
Basophils Relative: 1.1 % (ref 0.0–3.0)
Eosinophils Absolute: 0.2 10*3/uL (ref 0.0–0.7)
Eosinophils Relative: 2.3 % (ref 0.0–5.0)
HCT: 38.2 % (ref 36.0–46.0)
Hemoglobin: 13 g/dL (ref 12.0–15.0)
Lymphocytes Relative: 31.3 % (ref 12.0–46.0)
Lymphs Abs: 2.3 10*3/uL (ref 0.7–4.0)
MCHC: 34 g/dL (ref 30.0–36.0)
MCV: 93.7 fl (ref 78.0–100.0)
Monocytes Absolute: 0.5 10*3/uL (ref 0.1–1.0)
Monocytes Relative: 7.2 % (ref 3.0–12.0)
Neutro Abs: 4.3 10*3/uL (ref 1.4–7.7)
Neutrophils Relative %: 58.1 % (ref 43.0–77.0)
Platelets: 230 10*3/uL (ref 150.0–400.0)
RBC: 4.08 Mil/uL (ref 3.87–5.11)
RDW: 14 % (ref 11.5–15.5)
WBC: 7.4 10*3/uL (ref 4.0–10.5)

## 2020-02-17 LAB — BASIC METABOLIC PANEL
BUN: 13 mg/dL (ref 6–23)
CO2: 29 mEq/L (ref 19–32)
Calcium: 9.7 mg/dL (ref 8.4–10.5)
Chloride: 104 mEq/L (ref 96–112)
Creatinine, Ser: 0.66 mg/dL (ref 0.40–1.20)
GFR: 91.07 mL/min (ref >60.00)
Glucose, Bld: 101 mg/dL — ABNORMAL HIGH (ref 70–99)
Potassium: 4.3 mEq/L (ref 3.5–5.1)
Sodium: 136 mEq/L (ref 135–145)

## 2020-02-17 LAB — LIPID PANEL
Cholesterol: 163 mg/dL (ref 0–200)
HDL: 46.3 mg/dL (ref >39.00)
LDL Cholesterol: 93 mg/dL (ref 0–99)
NonHDL: 117.12
Total CHOL/HDL Ratio: 4
Triglycerides: 119 mg/dL (ref 0.0–149.0)
VLDL: 23.8 mg/dL (ref 0.0–40.0)

## 2020-02-17 LAB — HEPATIC FUNCTION PANEL
ALT: 30 U/L (ref 0–35)
AST: 31 U/L (ref 0–37)
Albumin: 4.5 g/dL (ref 3.5–5.2)
Alkaline Phosphatase: 45 U/L (ref 39–117)
Bilirubin, Direct: 0.1 mg/dL (ref 0.0–0.3)
Total Bilirubin: 0.5 mg/dL (ref 0.2–1.2)
Total Protein: 7.7 g/dL (ref 6.0–8.3)

## 2020-02-17 LAB — HEMOGLOBIN A1C: Hgb A1c MFr Bld: 6.4 % (ref 4.6–6.5)

## 2020-02-18 ENCOUNTER — Encounter: Payer: Self-pay | Admitting: Internal Medicine

## 2020-03-05 ENCOUNTER — Other Ambulatory Visit: Payer: Self-pay | Admitting: Internal Medicine

## 2020-03-28 ENCOUNTER — Other Ambulatory Visit: Payer: Self-pay

## 2020-03-28 MED ORDER — AMLODIPINE BESYLATE 10 MG PO TABS: ORAL_TABLET | ORAL | 5 refills | Status: DC

## 2020-03-28 MED ORDER — ATORVASTATIN CALCIUM 20 MG PO TABS: 20.0000 mg | ORAL_TABLET | Freq: Every day | ORAL | 1 refills | Status: DC

## 2020-04-27 ENCOUNTER — Other Ambulatory Visit: Payer: Self-pay

## 2020-04-27 ENCOUNTER — Encounter: Payer: Self-pay | Admitting: Internal Medicine

## 2020-04-27 MED ORDER — PANTOPRAZOLE SODIUM 40 MG PO TBEC: 40.0000 mg | DELAYED_RELEASE_TABLET | Freq: Every day | ORAL | 1 refills | Status: DC

## 2020-05-21 ENCOUNTER — Encounter: Payer: Self-pay | Admitting: Internal Medicine

## 2020-05-21 MED ORDER — CITALOPRAM HYDROBROMIDE 40 MG PO TABS: ORAL_TABLET | ORAL | 1 refills | Status: DC

## 2020-05-21 MED ORDER — SPIRONOLACTONE 25 MG PO TABS: 25.0000 mg | ORAL_TABLET | Freq: Every day | ORAL | 1 refills | Status: DC

## 2020-05-28 ENCOUNTER — Encounter: Payer: Self-pay | Admitting: Internal Medicine

## 2020-05-29 ENCOUNTER — Encounter: Payer: Self-pay | Admitting: Internal Medicine

## 2020-05-29 MED ORDER — CONTOUR NEXT TEST VI STRP: ORAL_STRIP | 12 refills | Status: DC

## 2020-05-30 ENCOUNTER — Other Ambulatory Visit: Payer: Self-pay | Admitting: Internal Medicine

## 2020-05-30 ENCOUNTER — Other Ambulatory Visit: Payer: Self-pay

## 2020-05-30 MED ORDER — BUSPIRONE HCL 5 MG PO TABS: 5.0000 mg | ORAL_TABLET | Freq: Every day | ORAL | 0 refills | Status: DC | PRN

## 2020-06-08 ENCOUNTER — Encounter: Payer: Self-pay | Admitting: Internal Medicine

## 2020-06-08 ENCOUNTER — Other Ambulatory Visit: Payer: Self-pay

## 2020-06-08 ENCOUNTER — Ambulatory Visit (INDEPENDENT_AMBULATORY_CARE_PROVIDER_SITE_OTHER): Payer: Managed Care, Other (non HMO) | Admitting: Internal Medicine

## 2020-06-08 VITALS — BP 118/72 | HR 91 | Temp 98.9°F | Resp 16 | Ht 68.0 in | Wt 272.0 lb

## 2020-06-08 DIAGNOSIS — E7800 Pure hypercholesterolemia, unspecified: Secondary | ICD-10-CM

## 2020-06-08 DIAGNOSIS — Z1231 Encounter for screening mammogram for malignant neoplasm of breast: Secondary | ICD-10-CM | POA: Diagnosis not present

## 2020-06-08 DIAGNOSIS — Z23 Encounter for immunization: Secondary | ICD-10-CM

## 2020-06-08 DIAGNOSIS — I10 Essential (primary) hypertension: Secondary | ICD-10-CM | POA: Diagnosis not present

## 2020-06-08 DIAGNOSIS — G4730 Sleep apnea, unspecified: Secondary | ICD-10-CM

## 2020-06-08 DIAGNOSIS — R739 Hyperglycemia, unspecified: Secondary | ICD-10-CM | POA: Diagnosis not present

## 2020-06-08 DIAGNOSIS — K219 Gastro-esophageal reflux disease without esophagitis: Secondary | ICD-10-CM

## 2020-06-08 DIAGNOSIS — F419 Anxiety disorder, unspecified: Secondary | ICD-10-CM

## 2020-06-08 LAB — BASIC METABOLIC PANEL
BUN: 11 mg/dL (ref 6–23)
CO2: 26 mEq/L (ref 19–32)
Calcium: 9.9 mg/dL (ref 8.4–10.5)
Chloride: 102 mEq/L (ref 96–112)
Creatinine, Ser: 0.82 mg/dL (ref 0.40–1.20)
GFR: 70.81 mL/min (ref >60.00)
Glucose, Bld: 104 mg/dL — ABNORMAL HIGH (ref 70–99)
Potassium: 4.1 mEq/L (ref 3.5–5.1)
Sodium: 137 mEq/L (ref 135–145)

## 2020-06-08 LAB — HEPATIC FUNCTION PANEL
ALT: 28 U/L (ref 0–35)
AST: 25 U/L (ref 0–37)
Albumin: 4.7 g/dL (ref 3.5–5.2)
Alkaline Phosphatase: 49 U/L (ref 39–117)
Bilirubin, Direct: 0.1 mg/dL (ref 0.0–0.3)
Total Bilirubin: 0.6 mg/dL (ref 0.2–1.2)
Total Protein: 8 g/dL (ref 6.0–8.3)

## 2020-06-08 LAB — LIPID PANEL
Cholesterol: 158 mg/dL (ref 0–200)
HDL: 52.2 mg/dL (ref >39.00)
LDL Cholesterol: 86 mg/dL (ref 0–99)
NonHDL: 105.91
Total CHOL/HDL Ratio: 3
Triglycerides: 99 mg/dL (ref 0.0–149.0)
VLDL: 19.8 mg/dL (ref 0.0–40.0)

## 2020-06-08 LAB — HEMOGLOBIN A1C: Hgb A1c MFr Bld: 6.2 % (ref 4.6–6.5)

## 2020-06-08 MED ORDER — ONDANSETRON 4 MG PO TBDP
4.0000 mg | ORAL_TABLET | Freq: Two times a day (BID) | ORAL | 0 refills | Status: DC | PRN
Start: 2020-06-08 — End: 2021-08-02

## 2020-06-08 NOTE — Progress Notes (Signed)
Patient ID: KORBIN NOTARO, female   DOB: 1961-10-12, 59 y.o.   MRN: [id number]   Subjective:    Patient ID: SANDAR KRINKE, female    DOB: [birth year], 59 y.o.   MRN: [id number]  HPI This visit occurred during the SARS-CoV-2 public health emergency.  Safety protocols were in place, including screening questions prior to the visit, additional usage of staff PPE, and extensive cleaning of exam room while observing appropriate contact time as indicated for disinfecting solutions.  Patient here for a scheduled follow up.  She reports she is doing better.  Feels better.  Overall appears to be handling stress.  No chest pain or sob reported.  No abdominal pain or bowels change.  Discussed diet and exercise.  Had covid vaccine.  Blood pressure doing well.   Past Medical History:  Diagnosis Date  . Allergy   . Anemia   . Cancer ([place])   . Depression   . Diverticulosis   . FHx: migraine headaches   . GERD (gastroesophageal reflux disease)   . History of chickenpox   . Hypercholesterolemia   . Hyperlipidemia   . Hypertension   . Osteoarthritis   . Panic attacks   . Sleep apnea    Past Surgical History:  Procedure Laterality Date  . ABDOMINAL HYSTERECTOMY    . CATARACT EXTRACTION W/PHACO Left 08/11/2017   Procedure: CATARACT EXTRACTION PHACO AND INTRAOCULAR LENS PLACEMENT (IOC);  Surgeon: Birder Robson, MD;  Location: [facility];  Service: Ophthalmology;  Laterality: Left;  [place] 00:31 AP% 19.2 CDE 6.13 Fluid Pack lot # [id number]  . CATARACT EXTRACTION W/PHACO Right 09/01/2017   Procedure: CATARACT EXTRACTION PHACO AND INTRAOCULAR LENS PLACEMENT ([place]);  Surgeon: Birder Robson, MD;  Location: [facility];  Service: Ophthalmology;  Laterality: Right;  [place] 00:30 AP% 11.7 CDE3.52 fluid pack lot #[id number]  . Child Birth  51 and 56  . COLONOSCOPY    . COLONOSCOPY WITH PROPOFOL N/A 08/25/2018   Procedure: COLONOSCOPY WITH PROPOFOL;  Surgeon: Toledo, Benay Pike, MD;  Location: [facility];   Service: Gastroenterology;  Laterality: N/A;  . ESOPHAGOGASTRODUODENOSCOPY    . ESOPHAGOGASTRODUODENOSCOPY (EGD) WITH PROPOFOL N/A 08/25/2018   Procedure: ESOPHAGOGASTRODUODENOSCOPY (EGD) WITH PROPOFOL;  Surgeon: Toledo, Benay Pike, MD;  Location: [facility];  Service: Gastroenterology;  Laterality: N/A;  . FRACTURE SURGERY    . LAPAROSCOPIC SUPRACERVICAL HYSTERECTOMY  2006   ovaries left in place  . left elbow    . left knee    . Miscarriage  1987  . TONSILLECTOMY  1963  . TUBAL LIGATION  1993   Family History  Problem Relation Age of Onset  . Heart disease Mother        Incomplete heart block  . Hypertension Mother   . Diabetes Mother   . Nephrolithiasis Mother   . Migraines Mother   . Arthritis Mother        degenerative-back,osteoporosis  . Heart failure Mother   . Heart disease Father 58       Myocardial infarction  . Heart failure Father   . Heart disease Sister        h/o MI  . Migraines Sister    Social History   Socioeconomic History  . Marital status: Married    Spouse name: Not on file  . Number of children: Not on file  . Years of education: Not on file  . Highest education level: Not on file  Occupational History  . Not on file  Tobacco Use  .  Smoking status: Former Smoker    Packs/day: 0.10    Years: 1.50    Pack years: 0.15  . Smokeless tobacco: Never Used  Vaping Use  . Vaping Use: Never used  Substance and Sexual Activity  . Alcohol use: No  . Drug use: No  . Sexual activity: Not on file  Other Topics Concern  . Not on file  Social History Narrative  . Not on file   Social Determinants of Health   Financial Resource Strain:   . Difficulty of Paying Living Expenses: Not on file  Food Insecurity:   . Worried About Charity fundraiser in the Last Year: Not on file  . Ran Out of Food in the Last Year: Not on file  Transportation Needs:   . Lack of Transportation (Medical): Not on file  . Lack of Transportation (Non-Medical): Not on  file  Physical Activity:   . Days of Exercise per Week: Not on file  . Minutes of Exercise per Session: Not on file  Stress:   . Feeling of Stress : Not on file  Social Connections:   . Frequency of Communication with Friends and Family: Not on file  . Frequency of Social Gatherings with Friends and Family: Not on file  . Attends Religious Services: Not on file  . Active Member of Clubs or Organizations: Not on file  . Attends Archivist Meetings: Not on file  . Marital Status: Not on file    Outpatient Encounter Medications as of 06/08/2020  Medication Sig  . acetaminophen (TYLENOL) 650 MG CR tablet Take 650 mg by mouth every 8 (eight) hours as needed for pain.  Marland Kitchen amLODipine (NORVASC) 10 MG tablet TAKE 1 TABLET(10 MG) BY MOUTH DAILY  . atorvastatin (LIPITOR) 20 MG tablet Take 1 tablet (20 mg total) by mouth daily.  . Blood Glucose Monitoring Suppl (CONTOUR NEXT MONITOR) w/Device KIT 1 kit by Does not apply route 2 (two) times daily. DX: E11.9  . busPIRone (BUSPAR) 5 MG tablet Take 1 tablet (5 mg total) by mouth daily as needed.  . citalopram (CELEXA) 40 MG tablet TAKE 1 TABLET BY MOUTH DAILY. GENERIC EQUIVALENT FOR CELEXA  . glucose blood (CONTOUR NEXT TEST) test strip Use as instructed to check blood sugars twice daily Dx E11.9  . lisinopril (ZESTRIL) 40 MG tablet TAKE 1 TABLET(40 MG) BY MOUTH DAILY  . Microlet Lancets MISC Use as directed to check sugars twice daily. Dx E11.9  . ondansetron (ZOFRAN ODT) 4 MG disintegrating tablet Take 1 tablet (4 mg total) by mouth 2 (two) times daily as needed for nausea or vomiting.  . ondansetron (ZOFRAN) 4 MG tablet Take 4 mg by mouth every 8 (eight) hours as needed for nausea or vomiting.  . pantoprazole (PROTONIX) 40 MG tablet Take 1 tablet (40 mg total) by mouth daily.  Marland Kitchen spironolactone (ALDACTONE) 25 MG tablet Take 1 tablet (25 mg total) by mouth daily.   No facility-administered encounter medications on file as of 06/08/2020.     Review of Systems  Constitutional: Negative for appetite change and unexpected weight change.  HENT: Negative for congestion and sinus pressure.   Respiratory: Negative for cough, chest tightness and shortness of breath.   Cardiovascular: Negative for chest pain, palpitations and leg swelling.  Gastrointestinal: Negative for abdominal pain, diarrhea, nausea and vomiting.  Genitourinary: Negative for difficulty urinating and dysuria.  Musculoskeletal: Negative for joint swelling and myalgias.  Skin: Negative for color change and rash.  Neurological:  Negative for dizziness, light-headedness and headaches.  Psychiatric/Behavioral: Negative for agitation and dysphoric mood.       Objective:    Physical Exam Vitals reviewed.  Constitutional:      General: She is not in acute distress.    Appearance: Normal appearance.  HENT:     Head: Normocephalic and atraumatic.     Right Ear: External ear normal.     Left Ear: External ear normal.  Eyes:     General: No scleral icterus.       Right eye: No discharge.        Left eye: No discharge.     Conjunctiva/sclera: Conjunctivae normal.  Neck:     Thyroid: No thyromegaly.  Cardiovascular:     Rate and Rhythm: Normal rate and regular rhythm.  Pulmonary:     Effort: No respiratory distress.     Breath sounds: Normal breath sounds. No wheezing.  Abdominal:     General: Bowel sounds are normal.     Palpations: Abdomen is soft.     Tenderness: There is no abdominal tenderness.  Musculoskeletal:        General: No swelling or tenderness.     Cervical back: Neck supple. No tenderness.  Lymphadenopathy:     Cervical: No cervical adenopathy.  Skin:    Findings: No erythema or rash.  Neurological:     Mental Status: She is alert.  Psychiatric:        Mood and Affect: Mood normal.        Behavior: Behavior normal.     BP 118/72   Pulse 91   Temp 98.9 F (37.2 C) (Oral)   Resp 16   Ht 5' 8"  (1.727 m)   Wt 272 lb (123.4 kg)    SpO2 98%   BMI 41.36 kg/m  Wt Readings from Last 3 Encounters:  06/08/20 272 lb (123.4 kg)  02/03/20 281 lb (127.5 kg)  11/03/19 280 lb 9.6 oz (127.3 kg)     Lab Results  Component Value Date   WBC 7.4 02/17/2020   HGB 13.0 02/17/2020   HCT 38.2 02/17/2020   PLT 230.0 02/17/2020   GLUCOSE 104 (H) 06/08/2020   CHOL 158 06/08/2020   TRIG 99.0 06/08/2020   HDL 52.20 06/08/2020   LDLCALC 86 06/08/2020   ALT 28 06/08/2020   AST 25 06/08/2020   NA 137 06/08/2020   K 4.1 06/08/2020   CL 102 06/08/2020   CREATININE 0.82 06/08/2020   BUN 11 06/08/2020   CO2 26 06/08/2020   TSH 1.77 11/03/2019   HGBA1C 6.2 06/08/2020    MM 3D SCREEN BREAST BILATERAL  Result Date: 09/20/2018 CLINICAL DATA:  Screening. EXAM: DIGITAL SCREENING BILATERAL MAMMOGRAM WITH TOMO AND CAD COMPARISON:  Previous exam(s). ACR Breast Density Category a: The breast tissue is almost entirely fatty. FINDINGS: There are no findings suspicious for malignancy. Images were processed with CAD. IMPRESSION: No mammographic evidence of malignancy. A result letter of this screening mammogram will be mailed directly to the patient. RECOMMENDATION: Screening mammogram in one year. (Code:SM-B-01Y) BI-RADS CATEGORY  1: Negative. Electronically Signed   By: Franki Cabot M.D.   On: 09/20/2018 12:58       Assessment & Plan:   Problem List Items Addressed This Visit    Sleep apnea    Continue cpap.       Hyperglycemia    Low carb diet and exercise.  Follow met b and a1c.       Relevant  Orders   Hemoglobin A1c (Completed)   Hypercholesterolemia    On lipitor.  Low cholesterol diet and exercise.  Follow lipid panel and liver function tests.       Relevant Orders   Hepatic function panel (Completed)   Lipid panel (Completed)   GERD (gastroesophageal reflux disease)    No upper symptoms reported.  On protonix.       Relevant Medications   ondansetron (ZOFRAN ODT) 4 MG disintegrating tablet   Essential  hypertension, benign    Blood pressure doing well on current regimen.  Continue amlodipine, lisinopril and aldactone.  Follow pressures.  Follow metabolic panel.       Relevant Orders   Basic metabolic panel (Completed)   Anxiety    Doing better.  Continue citalopram and buspar.         Other Visit Diagnoses    Encounter for screening mammogram for malignant neoplasm of breast    -  Primary   Relevant Orders   MM 3D SCREEN BREAST BILATERAL   Need for immunization against influenza       Relevant Orders   Flu Vaccine QUAD 36+ mos IM (Completed)       Einar Pheasant, MD

## 2020-06-17 NOTE — Assessment & Plan Note (Signed)
Low carb diet and exercise.  Follow met b and a1c.  

## 2020-06-17 NOTE — Assessment & Plan Note (Signed)
Continue cpap.  

## 2020-06-17 NOTE — Assessment & Plan Note (Signed)
On lipitor.  Low cholesterol diet and exercise.  Follow lipid panel and liver function tests.   

## 2020-06-17 NOTE — Assessment & Plan Note (Signed)
Doing better.  Continue citalopram and buspar.

## 2020-06-17 NOTE — Assessment & Plan Note (Signed)
Blood pressure doing well on current regimen.  Continue amlodipine, lisinopril and aldactone.  Follow pressures.  Follow metabolic panel.

## 2020-06-17 NOTE — Assessment & Plan Note (Signed)
No upper symptoms reported.  On protonix.   

## 2020-06-29 ENCOUNTER — Other Ambulatory Visit: Payer: Self-pay | Admitting: Internal Medicine

## 2020-07-23 ENCOUNTER — Ambulatory Visit
Admission: RE | Admit: 2020-07-23 | Discharge: 2020-07-23 | Disposition: A | Discharge status: Home / Self Care | Payer: Managed Care, Other (non HMO) | Source: Ambulatory Visit | Attending: Internal Medicine | Admitting: Internal Medicine

## 2020-07-23 ENCOUNTER — Other Ambulatory Visit: Payer: Self-pay

## 2020-07-23 DIAGNOSIS — Z1231 Encounter for screening mammogram for malignant neoplasm of breast: Secondary | ICD-10-CM

## 2020-07-30 ENCOUNTER — Encounter: Payer: Self-pay | Admitting: Internal Medicine

## 2020-08-22 ENCOUNTER — Encounter: Payer: Self-pay | Admitting: Internal Medicine

## 2020-08-24 ENCOUNTER — Other Ambulatory Visit: Payer: Self-pay

## 2020-08-24 MED ORDER — BUSPIRONE HCL 5 MG PO TABS: ORAL_TABLET | ORAL | 1 refills | Status: DC

## 2020-08-25 ENCOUNTER — Other Ambulatory Visit: Payer: Self-pay | Admitting: Internal Medicine

## 2020-08-28 ENCOUNTER — Other Ambulatory Visit: Payer: Self-pay

## 2020-08-28 MED ORDER — BUSPIRONE HCL 5 MG PO TABS: ORAL_TABLET | ORAL | 1 refills | Status: DC

## 2020-09-12 ENCOUNTER — Other Ambulatory Visit: Payer: Self-pay | Admitting: Internal Medicine

## 2020-10-09 ENCOUNTER — Ambulatory Visit: Payer: Managed Care, Other (non HMO) | Admitting: Internal Medicine

## 2020-10-15 ENCOUNTER — Encounter: Payer: Self-pay | Admitting: Internal Medicine

## 2020-10-16 ENCOUNTER — Other Ambulatory Visit: Payer: Self-pay

## 2020-10-16 MED ORDER — PANTOPRAZOLE SODIUM 40 MG PO TBEC
40.0000 mg | DELAYED_RELEASE_TABLET | Freq: Every day | ORAL | 1 refills | Status: DC
Start: 2020-10-16 — End: 2021-04-02

## 2020-10-18 ENCOUNTER — Encounter: Payer: Self-pay | Admitting: Internal Medicine

## 2020-10-18 ENCOUNTER — Other Ambulatory Visit: Payer: Self-pay | Admitting: Internal Medicine

## 2020-10-18 NOTE — Telephone Encounter (Signed)
Need to clarify a few things.  Received request for zofran.  This is not a medication that we usually keep pts on.  It is used as needed - for acute flares.  When did she last get a refill. Not showing in our system has been refilled.

## 2020-10-22 NOTE — Telephone Encounter (Signed)
Lmtcb.

## 2020-11-05 ENCOUNTER — Encounter: Payer: Self-pay | Admitting: Internal Medicine

## 2020-11-05 ENCOUNTER — Other Ambulatory Visit: Payer: Self-pay

## 2020-11-05 MED ORDER — CITALOPRAM HYDROBROMIDE 40 MG PO TABS: ORAL_TABLET | ORAL | 1 refills | Status: DC

## 2020-11-05 MED ORDER — SPIRONOLACTONE 25 MG PO TABS
25.0000 mg | ORAL_TABLET | Freq: Every day | ORAL | 1 refills | Status: DC
Start: 2020-11-05 — End: 2020-11-27

## 2020-11-27 ENCOUNTER — Other Ambulatory Visit: Payer: Self-pay

## 2020-11-27 MED ORDER — SPIRONOLACTONE 25 MG PO TABS
25.0000 mg | ORAL_TABLET | Freq: Every day | ORAL | 1 refills | Status: DC
Start: 2020-11-27 — End: 2021-05-21

## 2020-12-01 ENCOUNTER — Encounter: Payer: Self-pay | Admitting: Internal Medicine

## 2020-12-07 ENCOUNTER — Ambulatory Visit: Payer: Managed Care, Other (non HMO) | Admitting: Internal Medicine

## 2020-12-12 ENCOUNTER — Ambulatory Visit (INDEPENDENT_AMBULATORY_CARE_PROVIDER_SITE_OTHER): Payer: Managed Care, Other (non HMO) | Admitting: Internal Medicine

## 2020-12-12 ENCOUNTER — Other Ambulatory Visit: Payer: Self-pay

## 2020-12-12 ENCOUNTER — Ambulatory Visit (INDEPENDENT_AMBULATORY_CARE_PROVIDER_SITE_OTHER): Payer: Managed Care, Other (non HMO)

## 2020-12-12 VITALS — BP 124/70 | HR 82 | Temp 98.3°F | Resp 16 | Ht 68.0 in | Wt 260.0 lb

## 2020-12-12 DIAGNOSIS — I10 Essential (primary) hypertension: Secondary | ICD-10-CM

## 2020-12-12 DIAGNOSIS — K219 Gastro-esophageal reflux disease without esophagitis: Secondary | ICD-10-CM

## 2020-12-12 DIAGNOSIS — G4730 Sleep apnea, unspecified: Secondary | ICD-10-CM

## 2020-12-12 DIAGNOSIS — M25571 Pain in right ankle and joints of right foot: Secondary | ICD-10-CM

## 2020-12-12 DIAGNOSIS — E7800 Pure hypercholesterolemia, unspecified: Secondary | ICD-10-CM | POA: Diagnosis not present

## 2020-12-12 DIAGNOSIS — R739 Hyperglycemia, unspecified: Secondary | ICD-10-CM | POA: Diagnosis not present

## 2020-12-12 DIAGNOSIS — F419 Anxiety disorder, unspecified: Secondary | ICD-10-CM

## 2020-12-12 LAB — HEPATIC FUNCTION PANEL
ALT: 18 U/L (ref 0–35)
AST: 19 U/L (ref 0–37)
Albumin: 4.5 g/dL (ref 3.5–5.2)
Alkaline Phosphatase: 52 U/L (ref 39–117)
Bilirubin, Direct: 0.1 mg/dL (ref 0.0–0.3)
Total Bilirubin: 0.3 mg/dL (ref 0.2–1.2)
Total Protein: 7.5 g/dL (ref 6.0–8.3)

## 2020-12-12 LAB — BASIC METABOLIC PANEL
BUN: 13 mg/dL (ref 6–23)
CO2: 29 mEq/L (ref 19–32)
Calcium: 10 mg/dL (ref 8.4–10.5)
Chloride: 101 mEq/L (ref 96–112)
Creatinine, Ser: 0.7 mg/dL (ref 0.40–1.20)
GFR: 93.12 mL/min (ref >60.00)
Glucose, Bld: 110 mg/dL — ABNORMAL HIGH (ref 70–99)
Potassium: 4.6 mEq/L (ref 3.5–5.1)
Sodium: 137 mEq/L (ref 135–145)

## 2020-12-12 LAB — LIPID PANEL
Cholesterol: 154 mg/dL (ref 0–200)
HDL: 53.4 mg/dL (ref >39.00)
LDL Cholesterol: 82 mg/dL (ref 0–99)
NonHDL: 100.82
Total CHOL/HDL Ratio: 3
Triglycerides: 95 mg/dL (ref 0.0–149.0)
VLDL: 19 mg/dL (ref 0.0–40.0)

## 2020-12-12 LAB — HEMOGLOBIN A1C: Hgb A1c MFr Bld: 6 % (ref 4.6–6.5)

## 2020-12-12 NOTE — Progress Notes (Signed)
[name], female   DOB: 06/21/1961, 60 y.o.   MRN: [id number]   Subjective:    [name], female    DOB: 12-03-1960, 60 y.o.   MRN: [id number]  HPI This visit occurred during the SARS-CoV-2 public health emergency.  Safety protocols were in place, including screening questions prior to the visit, additional usage of staff PPE, and extensive cleaning of exam room while observing appropriate contact time as indicated for disinfecting solutions.  Patient here for a scheduled follow up.  Here to follow up regarding her blood pressure and cholesterol.  She reports increased stress with her mother's health.  Discussed.  Overall she feels she is handling things well.  Doing better.  On citalopram and doing well with this medication.  She is taking her medication regularly.  Blood pressure doing better.  Reviewed attached readings.  No chest pain or sob reported.  No abdominal pain or bowel change reported.  Did fall in 10/2020.  Fell on her right side - injuring her right foot/ankle and knee.  Her knee is better.  No significant pain.  Still with increased pain in her ankle.  Especially with rotation of her foot, etc.  States when she fell, she heard something pop.  Was not evaluated.    Past Medical History:  Diagnosis Date  . Allergy   . Anemia   . Cancer ([place])   . Depression   . Diverticulosis   . FHx: migraine headaches   . GERD (gastroesophageal reflux disease)   . History of chickenpox   . Hypercholesterolemia   . Hyperlipidemia   . Hypertension   . Osteoarthritis   . Panic attacks   . Sleep apnea    Past Surgical History:  Procedure Laterality Date  . ABDOMINAL HYSTERECTOMY    . CATARACT EXTRACTION W/PHACO Left 08/11/2017   Procedure: CATARACT EXTRACTION PHACO AND INTRAOCULAR LENS PLACEMENT ([facility]);  Surgeon: Birder Robson, MD;  Location: [facility];  Service: Ophthalmology;  Laterality: Left;  [place] 00:31 AP% 19.2 CDE 6.13 Fluid Pack lot # [id number]   . CATARACT EXTRACTION W/PHACO Right 09/01/2017   Procedure: CATARACT EXTRACTION PHACO AND INTRAOCULAR LENS PLACEMENT ([place]);  Surgeon: Birder Robson, MD;  Location: [facility];  Service: Ophthalmology;  Laterality: Right;  [place] 00:30 AP% 11.7 CDE3.52 fluid pack lot #[id number]  . Child Birth  89 and 61  . COLONOSCOPY    . COLONOSCOPY WITH PROPOFOL N/A 08/25/2018   Procedure: COLONOSCOPY WITH PROPOFOL;  Surgeon: Toledo, Benay Pike, MD;  Location: [facility];  Service: Gastroenterology;  Laterality: N/A;  . ESOPHAGOGASTRODUODENOSCOPY    . ESOPHAGOGASTRODUODENOSCOPY (EGD) WITH PROPOFOL N/A 08/25/2018   Procedure: ESOPHAGOGASTRODUODENOSCOPY (EGD) WITH PROPOFOL;  Surgeon: Toledo, Benay Pike, MD;  Location: [facility];  Service: Gastroenterology;  Laterality: N/A;  . FRACTURE SURGERY    . LAPAROSCOPIC SUPRACERVICAL HYSTERECTOMY  2006   ovaries left in place  . left elbow    . left knee    . Miscarriage  1987  . TONSILLECTOMY  1963  . TUBAL LIGATION  1993   Family History  Problem Relation Age of Onset  . Heart disease Mother        Incomplete heart block  . Hypertension Mother   . Diabetes Mother   . Nephrolithiasis Mother   . Migraines Mother   . Arthritis Mother        degenerative-back,osteoporosis  . Heart failure Mother   . Heart disease Father 75  Myocardial infarction  . Heart failure Father   . Heart disease Sister        h/o MI  . Migraines Sister   . Breast cancer Neg Hx    Social History   Socioeconomic History  . Marital status: Married    Spouse name: Not on file  . Number of children: Not on file  . Years of education: Not on file  . Highest education level: Not on file  Occupational History  . Not on file  Tobacco Use  . Smoking status: Former Smoker    Packs/day: 0.10    Years: 1.50    Pack years: 0.15  . Smokeless tobacco: Never Used  Vaping Use  . Vaping Use: Never used  Substance and Sexual Activity  . Alcohol use: No  . Drug use:  No  . Sexual activity: Not on file  Other Topics Concern  . Not on file  Social History Narrative  . Not on file   Social Determinants of Health   Financial Resource Strain: Not on file  Food Insecurity: Not on file  Transportation Needs: Not on file  Physical Activity: Not on file  Stress: Not on file  Social Connections: Not on file    Outpatient Encounter Medications as of 12/12/2020  Medication Sig  . acetaminophen (TYLENOL) 650 MG CR tablet Take 650 mg by mouth every 8 (eight) hours as needed for pain.  Marland Kitchen amLODipine (NORVASC) 10 MG tablet TAKE 1 TABLET(10 MG) BY MOUTH DAILY  . atorvastatin (LIPITOR) 20 MG tablet TAKE 1 TABLET(20 MG) BY MOUTH DAILY  . Blood Glucose Monitoring Suppl (CONTOUR NEXT MONITOR) w/Device KIT 1 kit by Does not apply route 2 (two) times daily. DX: E11.9  . busPIRone (BUSPAR) 5 MG tablet TAKE 1 TABLET(5 MG) BY MOUTH DAILY AS NEEDED  . citalopram (CELEXA) 40 MG tablet TAKE 1 TABLET BY MOUTH DAILY. GENERIC EQUIVALENT FOR CELEXA  . glucose blood (CONTOUR NEXT TEST) test strip Use as instructed to check blood sugars twice daily Dx E11.9  . lisinopril (ZESTRIL) 40 MG tablet TAKE 1 TABLET(40 MG) BY MOUTH DAILY  . Microlet Lancets MISC Use as directed to check sugars twice daily. Dx E11.9  . ondansetron (ZOFRAN ODT) 4 MG disintegrating tablet Take 1 tablet (4 mg total) by mouth 2 (two) times daily as needed for nausea or vomiting.  . ondansetron (ZOFRAN) 4 MG tablet Take 4 mg by mouth every 8 (eight) hours as needed for nausea or vomiting.  . pantoprazole (PROTONIX) 40 MG tablet Take 1 tablet (40 mg total) by mouth daily.  Marland Kitchen spironolactone (ALDACTONE) 25 MG tablet Take 1 tablet (25 mg total) by mouth daily.   No facility-administered encounter medications on file as of 12/12/2020.    Review of Systems  Constitutional: Negative for appetite change and unexpected weight change.  HENT: Negative for congestion and sinus pressure.   Respiratory: Negative for cough,  chest tightness and shortness of breath.   Cardiovascular: Negative for chest pain, palpitations and leg swelling.  Gastrointestinal: Negative for abdominal pain, diarrhea, nausea and vomiting.  Genitourinary: Negative for difficulty urinating and dysuria.  Musculoskeletal: Negative for myalgias.       Right ankle pain as outlined.    Skin: Negative for color change and rash.  Neurological: Negative for dizziness, light-headedness and headaches.  Psychiatric/Behavioral: Negative for agitation and dysphoric mood.       Objective:    Physical Exam Vitals reviewed.  Constitutional:      General:  She is not in acute distress.    Appearance: Normal appearance.  HENT:     Head: Normocephalic and atraumatic.     Right Ear: External ear normal.     Left Ear: External ear normal.     Mouth/Throat:     Mouth: Oropharynx is clear and moist.  Eyes:     General: No scleral icterus.       Right eye: No discharge.        Left eye: No discharge.     Conjunctiva/sclera: Conjunctivae normal.  Neck:     Thyroid: No thyromegaly.  Cardiovascular:     Rate and Rhythm: Normal rate and regular rhythm.  Pulmonary:     Effort: No respiratory distress.     Breath sounds: Normal breath sounds. No wheezing.  Abdominal:     General: Bowel sounds are normal.     Palpations: Abdomen is soft.     Tenderness: There is no abdominal tenderness.  Musculoskeletal:        General: No edema.     Cervical back: Neck supple. No tenderness.     Comments: Right ankle pain and soft tissue swelling.  Pain with palpation - lateral.  Increased pain with rotation and inversion and eversion of foot.    Lymphadenopathy:     Cervical: No cervical adenopathy.  Skin:    Findings: No erythema or rash.  Neurological:     Mental Status: She is alert.  Psychiatric:        Mood and Affect: Mood normal.        Behavior: Behavior normal.     BP 124/70   Pulse 82   Temp 98.3 F (36.8 C) (Oral)   Resp 16   Ht 5' 8"   (1.727 m)   Wt 260 lb (117.9 kg)   SpO2 98%   BMI 39.53 kg/m  Wt Readings from Last 3 Encounters:  12/12/20 260 lb (117.9 kg)  06/08/20 272 lb (123.4 kg)  02/03/20 281 lb (127.5 kg)     Lab Results  Component Value Date   WBC 7.4 02/17/2020   HGB 13.0 02/17/2020   HCT 38.2 02/17/2020   PLT 230.0 02/17/2020   GLUCOSE 110 (H) 12/12/2020   CHOL 154 12/12/2020   TRIG 95.0 12/12/2020   HDL 53.40 12/12/2020   LDLCALC 82 12/12/2020   ALT 18 12/12/2020   AST 19 12/12/2020   NA 137 12/12/2020   K 4.6 12/12/2020   CL 101 12/12/2020   CREATININE 0.70 12/12/2020   BUN 13 12/12/2020   CO2 29 12/12/2020   TSH 0.78 12/12/2020   HGBA1C 6.0 12/12/2020    MM 3D SCREEN BREAST BILATERAL  Result Date: 07/25/2020 CLINICAL DATA:  Screening. EXAM: DIGITAL SCREENING BILATERAL MAMMOGRAM WITH TOMO AND CAD COMPARISON:  Previous exam(s). ACR Breast Density Category a: The breast tissue is almost entirely fatty. FINDINGS: There are no findings suspicious for malignancy. Images were processed with CAD. IMPRESSION: No mammographic evidence of malignancy. A result letter of this screening mammogram will be mailed directly to the patient. RECOMMENDATION: Screening mammogram in one year. (Code:SM-B-01Y) BI-RADS CATEGORY  1: Negative. Electronically Signed   By: Kristopher Oppenheim M.D.   On: 07/25/2020 11:22       Assessment & Plan:   Problem List Items Addressed This Visit    Ankle pain, right    Persistent pain s/p injury.  Check xray.  Has been walking.        Relevant Orders   DG  Ankle 2 Views Right (Completed)   Anxiety    Continue citalopram and buspar.  Doing better. Follow.        Essential hypertension, benign    Continue amlodipine, lisinopril and aldactone.  Blood pressure doing well.  Follow pressures.  Follow metabolic panel.       GERD (gastroesophageal reflux disease)    No upper symptoms reported.  On protonix.       Hypercholesterolemia    On lipitor.  Low cholesterol diet  and exercise.  Follow lipid panel and liver function tests.        Relevant Orders   Hepatic function panel (Completed)   Lipid panel (Completed)   TSH (Completed)   Basic metabolic panel (Completed)   Hyperglycemia - Primary    Low carb diet and exercise.  Follow met b and a1c.       Relevant Orders   Hemoglobin A1c (Completed)   Sleep apnea    Continue cpap.           Einar Pheasant, MD

## 2020-12-13 ENCOUNTER — Encounter: Payer: Self-pay | Admitting: Internal Medicine

## 2020-12-13 DIAGNOSIS — S82891A Other fracture of right lower leg, initial encounter for closed fracture: Secondary | ICD-10-CM

## 2020-12-13 LAB — TSH: TSH: 0.78 u[IU]/mL (ref 0.35–4.50)

## 2020-12-14 NOTE — Telephone Encounter (Signed)
Order placed for podiatry referral.   

## 2020-12-16 ENCOUNTER — Encounter: Payer: Self-pay | Admitting: Internal Medicine

## 2020-12-16 NOTE — Assessment & Plan Note (Signed)
On lipitor.  Low cholesterol diet and exercise.  Follow lipid panel and liver function tests.   

## 2020-12-16 NOTE — Assessment & Plan Note (Signed)
Continue cpap.  

## 2020-12-16 NOTE — Assessment & Plan Note (Signed)
Continue amlodipine, lisinopril and aldactone.  Blood pressure doing well.  Follow pressures.  Follow metabolic panel.

## 2020-12-16 NOTE — Assessment & Plan Note (Signed)
Low carb diet and exercise.  Follow met b and a1c.  

## 2020-12-16 NOTE — Assessment & Plan Note (Signed)
Continue citalopram and buspar.  Doing better. Follow.

## 2020-12-16 NOTE — Assessment & Plan Note (Signed)
Persistent pain s/p injury.  Check xray.  Has been walking.

## 2020-12-16 NOTE — Assessment & Plan Note (Signed)
No upper symptoms reported.  On protonix.   

## 2020-12-18 ENCOUNTER — Ambulatory Visit (INDEPENDENT_AMBULATORY_CARE_PROVIDER_SITE_OTHER): Payer: Managed Care, Other (non HMO) | Admitting: Podiatry

## 2020-12-18 ENCOUNTER — Other Ambulatory Visit: Payer: Self-pay

## 2020-12-18 VITALS — Temp 98.3°F

## 2020-12-18 DIAGNOSIS — S82831A Other fracture of upper and lower end of right fibula, initial encounter for closed fracture: Secondary | ICD-10-CM | POA: Diagnosis not present

## 2020-12-18 MED ORDER — MELOXICAM 15 MG PO TABS: 15.0000 mg | ORAL_TABLET | Freq: Every day | ORAL | 1 refills | Status: DC

## 2020-12-18 NOTE — Progress Notes (Signed)
   Subjective:  60 y.o. female presenting today as a new patient for evaluation of an injury that the patient sustained to the right ankle back in January 2022.  Patient states that she sustained a trip and fall accident on 10/18/2020.  She did not have it evaluated until her 75-month follow-up exam by her PCP.  X-rays were taken on 12/12/2020 by the PCP and she was diagnosed with an avulsion fracture of the distal tip of the fibula.  She presents for follow-up treatment and evaluation.  She states that the pain can get 6-8/10 on a daily basis.  She has not done anything for treatment.   Past Medical History:  Diagnosis Date  . Allergy   . Anemia   . Cancer ([place])   . Depression   . Diverticulosis   . FHx: migraine headaches   . GERD (gastroesophageal reflux disease)   . History of chickenpox   . Hypercholesterolemia   . Hyperlipidemia   . Hypertension   . Osteoarthritis   . Panic attacks   . Sleep apnea      Objective / Physical Exam:  General:  The patient is alert and oriented x3 in no acute distress. Dermatology:  Skin is warm, dry and supple bilateral lower extremities. Negative for open lesions or macerations. Vascular:  Palpable pedal pulses bilaterally. No edema or erythema noted. Capillary refill within normal limits. Neurological:  Epicritic and protective threshold grossly intact bilaterally.  Musculoskeletal Exam:  Pain on palpation to the lateral aspects of the patient's right ankle just overlying the lateral malleolus. Mild edema noted. Range of motion within normal limits to all pedal and ankle joints bilateral. Muscle strength 5/5 in all groups bilateral.   Radiographic Exam taken 12/12/2020 RT ankle:  FINDINGS: Transverse fracture of the distal lateral malleolus with minimal distraction of the fragments. Associated lateral soft tissue swelling. Small effusion. Mild inferior and posterior calcaneal enthesophyte formation.  IMPRESSION: Minimally distracted distal  lateral malleolar fracture with an associated small effusion.  Assessment: 1.  Avulsion fracture lateral malleolus RT.  DOI: 10/18/2020  Plan of Care:  1. Patient was evaluated. X-Rays reviewed.  2.  At this point we are going to pursue conservative treatment.  Recommend immobilization in a cam boot x6 weeks. 3.  Cam boot dispensed.  Weightbearing as tolerated x6 weeks 4.  Prescription for meloxicam 15 mg daily 5.  Return to clinic in 6 weeks for follow-up x-ray and exam   Edrick Kins, DPM [facility]  Dr. Edrick Kins, DPM    [address], [place] [address]                Office [phone]  Fax [phone]

## 2021-01-01 ENCOUNTER — Other Ambulatory Visit: Payer: Self-pay | Admitting: Internal Medicine

## 2021-01-12 ENCOUNTER — Other Ambulatory Visit: Payer: Self-pay | Admitting: Internal Medicine

## 2021-01-29 ENCOUNTER — Ambulatory Visit: Payer: Managed Care, Other (non HMO) | Admitting: Podiatry

## 2021-02-08 ENCOUNTER — Ambulatory Visit (INDEPENDENT_AMBULATORY_CARE_PROVIDER_SITE_OTHER): Payer: Managed Care, Other (non HMO) | Admitting: Podiatry

## 2021-02-08 ENCOUNTER — Encounter: Payer: Self-pay | Admitting: Podiatry

## 2021-02-08 ENCOUNTER — Ambulatory Visit (INDEPENDENT_AMBULATORY_CARE_PROVIDER_SITE_OTHER): Payer: Managed Care, Other (non HMO)

## 2021-02-08 ENCOUNTER — Other Ambulatory Visit: Payer: Self-pay

## 2021-02-08 DIAGNOSIS — S82831A Other fracture of upper and lower end of right fibula, initial encounter for closed fracture: Secondary | ICD-10-CM

## 2021-02-11 ENCOUNTER — Encounter: Payer: Self-pay | Admitting: Internal Medicine

## 2021-02-12 ENCOUNTER — Other Ambulatory Visit: Payer: Self-pay

## 2021-02-12 MED ORDER — BUSPIRONE HCL 5 MG PO TABS: ORAL_TABLET | ORAL | 1 refills | Status: DC

## 2021-02-26 NOTE — Progress Notes (Signed)
   Subjective:  60 y.o. female presenting today for follow-up evaluation of an injury that the patient sustained to the right ankle back in January 2022.  Patient states that she sustained a trip and fall accident on 10/18/2020.  She did not have it evaluated until her 50-month follow-up exam by her PCP.  X-rays were taken on 12/12/2020 by the PCP and she was diagnosed with an avulsion fracture of the distal tip of the fibula.  She presents for follow-up treatment and evaluation.  She states that the pain can get 6-8/10 on a daily basis.    Patient states that she is feeling much better.  She does have some pain and stiffness with swelling but overall there is improvement.  She presents for further treatment and evaluation  Past Medical History:  Diagnosis Date  . Allergy   . Anemia   . Cancer ([place])   . Depression   . Diverticulosis   . FHx: migraine headaches   . GERD (gastroesophageal reflux disease)   . History of chickenpox   . Hypercholesterolemia   . Hyperlipidemia   . Hypertension   . Osteoarthritis   . Panic attacks   . Sleep apnea      Objective / Physical Exam:  General:  The patient is alert and oriented x3 in no acute distress. Dermatology:  Skin is warm, dry and supple bilateral lower extremities. Negative for open lesions or macerations. Vascular:  Palpable pedal pulses bilaterally. No edema or erythema noted. Capillary refill within normal limits. Neurological:  Epicritic and protective threshold grossly intact bilaterally.  Musculoskeletal Exam:  Significantly improved pain on palpation to the lateral aspects of the patient's right ankle just overlying the lateral malleolus. Mild edema noted. Range of motion within normal limits to all pedal and ankle joints bilateral. Muscle strength 5/5 in all groups bilateral.   Radiographic Exam taken 12/12/2020 RT ankle:  FINDINGS: Transverse fracture of the distal lateral malleolus with minimal distraction of the fragments.  Associated lateral soft tissue swelling. Small effusion. Mild inferior and posterior calcaneal enthesophyte formation.  Radiographic exam 02/08/2021 RT ankle:  IMPRESSION: Minimally distracted distal lateral malleolar fracture with an associated small effusion with routine healing  Assessment: 1.  Avulsion fracture lateral malleolus RT.  DOI: 10/18/2020  Plan of Care:  1. Patient was evaluated. X-Rays reviewed.  2.  Night splint provided.  Wear nightly 3.  Patient may DC cam boot.  Recommend ankle brace with sneakers.   4.  Continue meloxicam 15 mg daily as needed 5.  Return to clinic in 6 weeks for follow-up x-ray and exam   Edrick Kins, DPM [facility]  Dr. Edrick Kins, DPM    [address], [place] [address]                Office [phone]  Fax [phone]

## 2021-02-28 ENCOUNTER — Other Ambulatory Visit: Payer: Self-pay | Admitting: Internal Medicine

## 2021-03-22 ENCOUNTER — Ambulatory Visit (INDEPENDENT_AMBULATORY_CARE_PROVIDER_SITE_OTHER): Payer: Managed Care, Other (non HMO) | Admitting: Podiatry

## 2021-03-22 ENCOUNTER — Encounter: Payer: Self-pay | Admitting: Podiatry

## 2021-03-22 ENCOUNTER — Ambulatory Visit (INDEPENDENT_AMBULATORY_CARE_PROVIDER_SITE_OTHER): Payer: Managed Care, Other (non HMO)

## 2021-03-22 ENCOUNTER — Other Ambulatory Visit: Payer: Self-pay

## 2021-03-22 DIAGNOSIS — S82899A Other fracture of unspecified lower leg, initial encounter for closed fracture: Secondary | ICD-10-CM

## 2021-03-22 DIAGNOSIS — S82831A Other fracture of upper and lower end of right fibula, initial encounter for closed fracture: Secondary | ICD-10-CM

## 2021-03-22 NOTE — Progress Notes (Signed)
   Subjective:  60 y.o. female presenting today for follow-up evaluation of an injury that the patient sustained to the right ankle back in January 2022.  Patient states that she sustained a trip and fall accident on 10/18/2020.  She did not have it evaluated until her 15-month follow-up exam by her PCP.  X-rays were taken on 12/12/2020 by the PCP and she was diagnosed with an avulsion fracture of the distal tip of the fibula.  She presents for follow-up treatment and evaluation.  She states that the pain can get 6-8/10 on a daily basis.    Patient states that she is continuing to have some pain.  She has good days and bad days but overall she does not feel that there is significant improvement.  She also sustained a new trip and fall injury when going down the stairs out of her house into the garage on 02/22/2021.  Even prior to her fall injury she was still having ankle pain however.  Past Medical History:  Diagnosis Date   Allergy    Anemia    Cancer ([place])    Depression    Diverticulosis    FHx: migraine headaches    GERD (gastroesophageal reflux disease)    History of chickenpox    Hypercholesterolemia    Hyperlipidemia    Hypertension    Osteoarthritis    Panic attacks    Sleep apnea      Objective / Physical Exam:  General:  The patient is alert and oriented x3 in no acute distress. Dermatology:  Skin is warm, dry and supple bilateral lower extremities. Negative for open lesions or macerations. Vascular:  Palpable pedal pulses bilaterally. No edema or erythema noted. Capillary refill within normal limits. Neurological:  Epicritic and protective threshold grossly intact bilaterally.  Musculoskeletal Exam:  Clinically the patient continues to have pain on palpation to the lateral aspect of the patient's right ankle joint just overlying the lateral malleolus with moderate edema noted.  Radiographic Exam taken 12/12/2020 RT ankle:  FINDINGS: Transverse fracture of the distal lateral  malleolus with minimal distraction of the fragments. Associated lateral soft tissue swelling. Small effusion. Mild inferior and posterior calcaneal enthesophyte formation.   Radiographic exam 02/08/2021 RT ankle:  IMPRESSION: Minimally distracted distal lateral malleolar fracture with an associated small effusion with routine healing  Radiographic exam 03/22/2021 RT ankle: No significant change since prior x-rays.  No evidence of healing of the fracture fragment  Assessment: 1.  Avulsion fracture lateral malleolus RT.  DOI: 10/18/2020  Plan of Care:  1. Patient was evaluated. X-Rays reviewed.  2.  The patient has not had any significant relief now despite conservative treatments for about 5 months now. 3.  Today we are going to place an order for MRI RT ankle at [facility] 4.  Resume cam boot.  Weightbearing as tolerated 5.  Return to clinic after MRI to review results and discuss treatment options  Edrick Kins, DPM [facility]  Dr. Edrick Kins, DPM    [address], [place] [address]                Office [phone]  Fax [phone]

## 2021-03-27 ENCOUNTER — Telehealth: Payer: Self-pay | Admitting: *Deleted

## 2021-03-27 NOTE — Telephone Encounter (Signed)
"  My wife was there last week to see Dr. Amalia Hailey.  He said someone would call her to schedule an appointment for a MRI.  We still haven't heard anything."

## 2021-03-29 NOTE — Telephone Encounter (Signed)
Per Shirl Harris. With [employer], MRI has been approved from 03/29/2021 to 09/25/2021  Auth # [id number] Patient has been notified via voice mail and instructed to call scheduling department to set up appt at [facility] to her convenience.

## 2021-04-02 ENCOUNTER — Encounter: Payer: Self-pay | Admitting: Internal Medicine

## 2021-04-02 ENCOUNTER — Other Ambulatory Visit: Payer: Self-pay

## 2021-04-02 MED ORDER — PANTOPRAZOLE SODIUM 40 MG PO TBEC
40.0000 mg | DELAYED_RELEASE_TABLET | Freq: Every day | ORAL | 1 refills | Status: DC
Start: 2021-04-02 — End: 2021-08-02

## 2021-04-03 ENCOUNTER — Other Ambulatory Visit: Payer: Self-pay

## 2021-04-03 ENCOUNTER — Ambulatory Visit
Admission: RE | Admit: 2021-04-03 | Discharge: 2021-04-03 | Disposition: A | Discharge status: Home / Self Care | Payer: Managed Care, Other (non HMO) | Source: Ambulatory Visit | Attending: Podiatry | Admitting: Podiatry

## 2021-04-03 DIAGNOSIS — S82899A Other fracture of unspecified lower leg, initial encounter for closed fracture: Secondary | ICD-10-CM | POA: Diagnosis present

## 2021-04-12 ENCOUNTER — Ambulatory Visit (INDEPENDENT_AMBULATORY_CARE_PROVIDER_SITE_OTHER): Payer: Managed Care, Other (non HMO) | Admitting: Podiatry

## 2021-04-12 ENCOUNTER — Other Ambulatory Visit: Payer: Self-pay

## 2021-04-12 DIAGNOSIS — M25371 Other instability, right ankle: Secondary | ICD-10-CM

## 2021-04-12 DIAGNOSIS — S82831A Other fracture of upper and lower end of right fibula, initial encounter for closed fracture: Secondary | ICD-10-CM

## 2021-04-17 ENCOUNTER — Telehealth: Payer: Self-pay

## 2021-04-17 ENCOUNTER — Ambulatory Visit (INDEPENDENT_AMBULATORY_CARE_PROVIDER_SITE_OTHER): Payer: Managed Care, Other (non HMO) | Admitting: Internal Medicine

## 2021-04-17 ENCOUNTER — Other Ambulatory Visit: Payer: Self-pay

## 2021-04-17 VITALS — BP 124/76 | HR 96 | Temp 97.8°F | Resp 16 | Ht 68.0 in | Wt 267.4 lb

## 2021-04-17 DIAGNOSIS — Z01818 Encounter for other preprocedural examination: Secondary | ICD-10-CM | POA: Insufficient documentation

## 2021-04-17 DIAGNOSIS — M25571 Pain in right ankle and joints of right foot: Secondary | ICD-10-CM

## 2021-04-17 DIAGNOSIS — Z0000 Encounter for general adult medical examination without abnormal findings: Secondary | ICD-10-CM

## 2021-04-17 DIAGNOSIS — F419 Anxiety disorder, unspecified: Secondary | ICD-10-CM

## 2021-04-17 DIAGNOSIS — E7800 Pure hypercholesterolemia, unspecified: Secondary | ICD-10-CM

## 2021-04-17 DIAGNOSIS — I10 Essential (primary) hypertension: Secondary | ICD-10-CM

## 2021-04-17 DIAGNOSIS — K219 Gastro-esophageal reflux disease without esophagitis: Secondary | ICD-10-CM

## 2021-04-17 DIAGNOSIS — G4730 Sleep apnea, unspecified: Secondary | ICD-10-CM

## 2021-04-17 DIAGNOSIS — R739 Hyperglycemia, unspecified: Secondary | ICD-10-CM | POA: Diagnosis not present

## 2021-04-17 LAB — CBC WITH DIFFERENTIAL/PLATELET
Basophils Absolute: 0.1 10*3/uL (ref 0.0–0.1)
Basophils Relative: 1.3 % (ref 0.0–3.0)
Eosinophils Absolute: 0.1 10*3/uL (ref 0.0–0.7)
Eosinophils Relative: 1.9 % (ref 0.0–5.0)
HCT: 41.1 % (ref 36.0–46.0)
Hemoglobin: 14.1 g/dL (ref 12.0–15.0)
Lymphocytes Relative: 30.7 % (ref 12.0–46.0)
Lymphs Abs: 2.3 10*3/uL (ref 0.7–4.0)
MCHC: 34.3 g/dL (ref 30.0–36.0)
MCV: 92.3 fl (ref 78.0–100.0)
Monocytes Absolute: 0.8 10*3/uL (ref 0.1–1.0)
Monocytes Relative: 10 % (ref 3.0–12.0)
Neutro Abs: 4.3 10*3/uL (ref 1.4–7.7)
Neutrophils Relative %: 56.1 % (ref 43.0–77.0)
Platelets: 254 10*3/uL (ref 150.0–400.0)
RBC: 4.45 Mil/uL (ref 3.87–5.11)
RDW: 13.9 % (ref 11.5–15.5)
WBC: 7.6 10*3/uL (ref 4.0–10.5)

## 2021-04-17 LAB — BASIC METABOLIC PANEL
BUN: 12 mg/dL (ref 6–23)
CO2: 27 mEq/L (ref 19–32)
Calcium: 10.3 mg/dL (ref 8.4–10.5)
Chloride: 101 mEq/L (ref 96–112)
Creatinine, Ser: 0.78 mg/dL (ref 0.40–1.20)
GFR: 81.58 mL/min (ref >60.00)
Glucose, Bld: 106 mg/dL — ABNORMAL HIGH (ref 70–99)
Potassium: 4.5 mEq/L (ref 3.5–5.1)
Sodium: 138 mEq/L (ref 135–145)

## 2021-04-17 LAB — HEPATIC FUNCTION PANEL
ALT: 29 U/L (ref 0–35)
AST: 25 U/L (ref 0–37)
Albumin: 5 g/dL (ref 3.5–5.2)
Alkaline Phosphatase: 55 U/L (ref 39–117)
Bilirubin, Direct: 0.1 mg/dL (ref 0.0–0.3)
Total Bilirubin: 0.7 mg/dL (ref 0.2–1.2)
Total Protein: 8 g/dL (ref 6.0–8.3)

## 2021-04-17 LAB — LIPID PANEL
Cholesterol: 174 mg/dL (ref 0–200)
HDL: 54.1 mg/dL (ref >39.00)
LDL Cholesterol: 101 mg/dL — ABNORMAL HIGH (ref 0–99)
NonHDL: 119.95
Total CHOL/HDL Ratio: 3
Triglycerides: 97 mg/dL (ref 0.0–149.0)
VLDL: 19.4 mg/dL (ref 0.0–40.0)

## 2021-04-17 LAB — HEMOGLOBIN A1C: Hgb A1c MFr Bld: 5.8 % (ref 4.6–6.5)

## 2021-04-17 NOTE — Progress Notes (Signed)
[name], female   DOB: Nov 25, 1960, 60 y.o.   MRN: [id number]   Subjective:    [name], female    DOB: [birth year], 60 y.o.   MRN: [id number]  HPI This visit occurred during the SARS-CoV-2 public health emergency.  Safety protocols were in place, including screening questions prior to the visit, additional usage of staff PPE, and extensive cleaning of exam room while observing appropriate contact time as indicated for disinfecting solutions.   Patient here for her physical exam.  Persistent foot/ankle issues.  Planning for upcoming surgery.  Date has not been set yet, but states should be within the next 30 days.  Not able to be as active.  No chest pain or sob reported.  No increased cough or congestion.  No acid reflux reported.  No abdominal pain.  Bowels moving.  Main concern is her foot/ankle pain- fracture.  Seeing Dr Amalia Hailey.     Past Medical History:  Diagnosis Date   Allergy    Anemia    Cancer ([place])    Depression    Diverticulosis    FHx: migraine headaches    GERD (gastroesophageal reflux disease)    History of chickenpox    Hypercholesterolemia    Hyperlipidemia    Hypertension    Osteoarthritis    Panic attacks    Sleep apnea    Past Surgical History:  Procedure Laterality Date   ABDOMINAL HYSTERECTOMY     CATARACT EXTRACTION W/PHACO Left 08/11/2017   Procedure: CATARACT EXTRACTION PHACO AND INTRAOCULAR LENS PLACEMENT ([place]);  Surgeon: Birder Robson, MD;  Location: [facility] ORS;  Service: Ophthalmology;  Laterality: Left;  [place] 00:31 AP% 19.2 CDE 6.13 Fluid Pack lot # [id number]   CATARACT EXTRACTION W/PHACO Right 09/01/2017   Procedure: CATARACT EXTRACTION PHACO AND INTRAOCULAR LENS PLACEMENT ([facility]);  Surgeon: Birder Robson, MD;  Location: [facility] ORS;  Service: Ophthalmology;  Laterality: Right;  [place] 00:30 AP% 11.7 CDE3.52 fluid pack lot #[id number]   Child Birth  1983 and 1979   COLONOSCOPY     COLONOSCOPY WITH PROPOFOL N/A  08/25/2018   Procedure: COLONOSCOPY WITH PROPOFOL;  Surgeon: Toledo, Benay Pike, MD;  Location: [facility] ENDOSCOPY;  Service: Gastroenterology;  Laterality: N/A;   ESOPHAGOGASTRODUODENOSCOPY     ESOPHAGOGASTRODUODENOSCOPY (EGD) WITH PROPOFOL N/A 08/25/2018   Procedure: ESOPHAGOGASTRODUODENOSCOPY (EGD) WITH PROPOFOL;  Surgeon: Toledo, Benay Pike, MD;  Location: [facility] ENDOSCOPY;  Service: Gastroenterology;  Laterality: N/A;   FRACTURE SURGERY     LAPAROSCOPIC SUPRACERVICAL HYSTERECTOMY  2006   ovaries left in place   left elbow     left knee     Miscarriage  [place]   Family History  Problem Relation Age of Onset   Heart disease Mother        Incomplete heart block   Hypertension Mother    Diabetes Mother    Nephrolithiasis Mother    Migraines Mother    Arthritis Mother        degenerative-back,osteoporosis   Heart failure Mother    Heart disease Father 85       Myocardial infarction   Heart failure Father    Heart disease Sister        h/o MI   Migraines Sister    Breast cancer Neg Hx    Social History   Socioeconomic History   Marital status: Married    Spouse name: Not on file   Number of  children: Not on file   Years of education: Not on file   Highest education level: Not on file  Occupational History   Not on file  Tobacco Use   Smoking status: Former    Packs/day: 0.10    Years: 1.50    Pack years: 0.15    Types: Cigarettes   Smokeless tobacco: Never  Vaping Use   Vaping Use: Never used  Substance and Sexual Activity   Alcohol use: No   Drug use: No   Sexual activity: Not on file  Other Topics Concern   Not on file  Social History Narrative   Not on file   Social Determinants of Health   Financial Resource Strain: Not on file  Food Insecurity: Not on file  Transportation Needs: Not on file  Physical Activity: Not on file  Stress: Not on file  Social Connections: Not on file    Review of Systems   Constitutional:  Negative for appetite change and unexpected weight change.  HENT:  Negative for congestion, sinus pressure and sore throat.   Eyes:  Negative for pain and visual disturbance.  Respiratory:  Negative for cough, chest tightness and shortness of breath.   Cardiovascular:  Negative for chest pain, palpitations and leg swelling.  Gastrointestinal:  Negative for abdominal pain, diarrhea, nausea and vomiting.  Genitourinary:  Negative for difficulty urinating and dysuria.  Musculoskeletal:  Negative for myalgias.       Foot and ankle issues as outlined.   Skin:  Negative for color change and rash.  Neurological:  Negative for dizziness, light-headedness and headaches.  Hematological:  Negative for adenopathy. Does not bruise/bleed easily.  Psychiatric/Behavioral:  Negative for agitation and dysphoric mood.       Objective:    Physical Exam Vitals reviewed.  Constitutional:      General: She is not in acute distress.    Appearance: Normal appearance. She is well-developed.  HENT:     Head: Normocephalic and atraumatic.     Right Ear: External ear normal.     Left Ear: External ear normal.  Eyes:     General: No scleral icterus.       Right eye: No discharge.        Left eye: No discharge.     Conjunctiva/sclera: Conjunctivae normal.  Neck:     Thyroid: No thyromegaly.  Cardiovascular:     Rate and Rhythm: Normal rate and regular rhythm.  Pulmonary:     Effort: No tachypnea, accessory muscle usage or respiratory distress.     Breath sounds: Normal breath sounds. No decreased breath sounds or wheezing.  Chest:  Breasts:    Right: No inverted nipple, mass, nipple discharge or tenderness (no axillary adenopathy).     Left: No inverted nipple, mass, nipple discharge or tenderness (no axilarry adenopathy).  Abdominal:     General: Bowel sounds are normal.     Palpations: Abdomen is soft.     Tenderness: There is no abdominal tenderness.  Musculoskeletal:         General: No swelling or tenderness.     Cervical back: Neck supple.  Lymphadenopathy:     Cervical: No cervical adenopathy.  Skin:    Findings: No erythema or rash.  Neurological:     Mental Status: She is alert and oriented to person, place, and time.  Psychiatric:        Mood and Affect: Mood normal.        Behavior: Behavior normal.  BP 124/76   Pulse 96   Temp 97.8 F (36.6 C)   Resp 16   Ht _0  (1.727 m)   Wt 267 lb 6.4 oz (121.3 kg)   SpO2 98%   BMI 40.66 kg/m  Wt Readings from Last 3 Encounters:  04/17/21 267 lb 6.4 oz (121.3 kg)  04/03/21 260 lb (117.9 kg)  12/12/20 260 lb (117.9 kg)    Outpatient Encounter Medications as of 04/17/2021  Medication Sig   acetaminophen (TYLENOL) 650 MG CR tablet Take 650 mg by mouth every 8 (eight) hours as needed for pain.   amLODipine (NORVASC) 10 MG tablet TAKE 1 TABLET(10 MG) BY MOUTH DAILY   atorvastatin (LIPITOR) 20 MG tablet TAKE 1 TABLET(20 MG) BY MOUTH DAILY   Blood Glucose Monitoring Suppl (CONTOUR NEXT MONITOR) w/Device KIT 1 kit by Does not apply route 2 (two) times daily. DX: E11.9   busPIRone (BUSPAR) 5 MG tablet TAKE 1 TABLET(5 MG) BY MOUTH DAILY AS NEEDED   glucose blood (CONTOUR NEXT TEST) test strip Use as instructed to check blood sugars twice daily Dx E11.9   lisinopril (ZESTRIL) 40 MG tablet TAKE 1 TABLET(40 MG) BY MOUTH DAILY   meloxicam (MOBIC) 15 MG tablet Take 1 tablet (15 mg total) by mouth daily.   Microlet Lancets MISC USE TO CHECK SUGAR TWICE DAILY   ondansetron (ZOFRAN ODT) 4 MG disintegrating tablet Take 1 tablet (4 mg total) by mouth 2 (two) times daily as needed for nausea or vomiting.   ondansetron (ZOFRAN) 4 MG tablet Take 4 mg by mouth every 8 (eight) hours as needed for nausea or vomiting.   pantoprazole (PROTONIX) 40 MG tablet Take 1 tablet (40 mg total) by mouth daily.   spironolactone (ALDACTONE) 25 MG tablet Take 1 tablet (25 mg total) by mouth daily.   [DISCONTINUED] citalopram (CELEXA) 40  MG tablet TAKE 1 TABLET BY MOUTH DAILY. GENERIC EQUIVALENT FOR CELEXA   No facility-administered encounter medications on file as of 04/17/2021.     Lab Results  Component Value Date   WBC 7.6 04/17/2021   HGB 14.1 04/17/2021   HCT 41.1 04/17/2021   PLT 254.0 04/17/2021   GLUCOSE 106 (H) 04/17/2021   CHOL 174 04/17/2021   TRIG 97.0 04/17/2021   HDL 54.10 04/17/2021   LDLCALC 101 (H) 04/17/2021   ALT 29 04/17/2021   AST 25 04/17/2021   NA 138 04/17/2021   K 4.5 04/17/2021   CL 101 04/17/2021   CREATININE 0.78 04/17/2021   BUN 12 04/17/2021   CO2 27 04/17/2021   TSH 0.78 12/12/2020   HGBA1C 5.8 04/17/2021    MR ANKLE RIGHT WO CONTRAST  Result Date: 04/04/2021 CLINICAL DATA:  Patient fell in January 2022. Persistent pain and swelling. EXAM: MRI OF THE RIGHT ANKLE WITHOUT CONTRAST TECHNIQUE: Multiplanar, multisequence MR imaging of the ankle was performed. No intravenous contrast was administered. COMPARISON:  Radiographs 02/08/2021 FINDINGS: TENDONS Peroneal: Intact Posteromedial: Intact Anterior: Intact Achilles: Normal Plantar Fascia: Intact.  No findings for plantar fasciitis. LIGAMENTS Lateral: The anterior and posterior tibiofibular ligaments are intact. I do not see anterior talofibular ligament and I suspect is chronically torn. There is a rounded area of dark PD and T2 signal intensity in the anterolateral gutter. This discoid lesion can be seen with chronic ligamentous injury and can cause anterolateral impingement. The posterior talofibular ligament is intact. The calcaneal fibular ligament appears intact. Medial: Intact CARTILAGE Ankle Joint: Moderate degenerative changes with degenerative chondrosis, joint space narrowing, early  spurring and small joint effusion. No osteochondral lesion. Subtalar Joints/Sinus Tarsi: Moderate subtalar joint degenerative changes. There is an effusion in the posterior talocalcaneal facet. The sinus tarsi is unremarkable. Bones: No stress fracture or  AVN. No bone lesions. Mild midfoot degenerative changes are noted. Other: Unremarkable foot and ankle musculature. IMPRESSION: 1. Intact medial, lateral and anterior ankle tendons. 2. 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 There is a discoid lesion in the lateral gutter (fibrous tissue) which can cause anterolateral impingement. 3. Moderate tibiotalar and subtalar joint degenerative changes. 4. No stress fracture or AVN. Electronically Signed   By: Marijo Sanes M.D.   On: 04/04/2021 16:34       Assessment & Plan:   Problem List Items Addressed This Visit     Ankle pain, right    Persistent pain and limitations.  Seeing podiatry.  MRI as outlined.  Planning for surgery.  Preop as outlined.        Anxiety    Remains on citalopram and BuSpar.  Doing well on this medication regimen.  Follow-up.       Essential hypertension, benign    Continue amlodipine, lisinopril and Aldactone.  Blood pressures doing well.  Reviewed outside blood pressure checks and these were averaging 120s over 70s.  Will need close Intra-Op and postop monitoring of her heart rate and blood pressure to avoid extreme.  Follow pressures.  Follow metabolic panel.       Relevant Orders   CBC with Differential/Platelet (Completed)   Basic metabolic panel (Completed)   GERD (gastroesophageal reflux disease)    No upper symptoms reported.  On Protonix.       Healthcare maintenance    Physical today 04/17/21.  Mammogram 07/25/20 - birads I.  Colonoscopy 08/2018 - internal hemorrhoids.  Recommended f/u in 10 years.  Declines pap.         Hypercholesterolemia    On Lipitor.  Low-cholesterol diet and exercise.  Follow lipid panel liver function testing.       Relevant Orders   Lipid panel (Completed)   Hepatic function panel (Completed)   Hyperglycemia    Low-carb diet and exercise.  Follow metb and A1c.       Relevant Orders   Hemoglobin A1c (Completed)   Pre-op evaluation    Planning to have  foot/ankle surgery.  Dr. Amalia Hailey.  She is currently not having any chest pain, tightness or shortness of breath.  EKG obtained reveals sinus rhythm with no acute ischemic changes noted.  Given that she is without symptoms and given EKG findings I do feel she is at low risk from a cardiac standpoint to proceed with the planned surgery.  She will need close Intra-Op and postop monitoring following blood pressure to avoid extremes.       Relevant Orders   EKG 12-Lead (Completed)   Sleep apnea    CPAP.       Other Visit Diagnoses     Routine general medical examination at a health care facility    -  Primary        Einar Pheasant, MD

## 2021-04-17 NOTE — Assessment & Plan Note (Signed)
Physical today 04/17/21.  Mammogram 07/25/20 - birads I.  Colonoscopy 08/2018 - internal hemorrhoids.  Recommended f/u in 10 years.  Declines pap.

## 2021-04-17 NOTE — Telephone Encounter (Signed)
error 

## 2021-04-18 ENCOUNTER — Encounter: Payer: Self-pay | Admitting: Internal Medicine

## 2021-04-18 ENCOUNTER — Other Ambulatory Visit: Payer: Self-pay

## 2021-04-18 MED ORDER — CITALOPRAM HYDROBROMIDE 40 MG PO TABS: ORAL_TABLET | ORAL | 1 refills | Status: DC

## 2021-04-21 ENCOUNTER — Encounter: Payer: Self-pay | Admitting: Internal Medicine

## 2021-04-21 NOTE — Assessment & Plan Note (Signed)
Planning to have foot/ankle surgery.  Dr. Amalia Hailey.  She is currently not having any chest pain, tightness or shortness of breath.  EKG obtained reveals sinus rhythm with no acute ischemic changes noted.  Given that she is without symptoms and given EKG findings I do feel she is at low risk from a cardiac standpoint to proceed with the planned surgery.  She will need close Intra-Op and postop monitoring following blood pressure to avoid extremes.

## 2021-04-21 NOTE — Assessment & Plan Note (Addendum)
Persistent pain and limitations.  Seeing podiatry.  MRI as outlined.  Planning for surgery.  Preop as outlined.

## 2021-04-21 NOTE — Assessment & Plan Note (Signed)
Continue amlodipine, lisinopril and Aldactone.  Blood pressures doing well.  Reviewed outside blood pressure checks and these were averaging 120s over 70s.  Will need close Intra-Op and postop monitoring of her heart rate and blood pressure to avoid extreme.  Follow pressures.  Follow metabolic panel.

## 2021-04-21 NOTE — Assessment & Plan Note (Signed)
CPAP.  

## 2021-04-21 NOTE — Assessment & Plan Note (Signed)
Low-carb diet and exercise.  Follow met b and A1c. ?

## 2021-04-21 NOTE — Assessment & Plan Note (Signed)
On Lipitor.  Low-cholesterol diet and exercise.  Follow lipid panel liver function testing.

## 2021-04-21 NOTE — Assessment & Plan Note (Signed)
No upper symptoms reported.  On Protonix. 

## 2021-04-21 NOTE — Assessment & Plan Note (Signed)
Remains on citalopram and BuSpar.  Doing well on this medication regimen.  Follow-up.

## 2021-04-25 ENCOUNTER — Encounter: Payer: Self-pay | Admitting: Podiatry

## 2021-05-02 NOTE — Progress Notes (Signed)
Subjective:  60 y.o. female presenting today for follow-up evaluation of an injury that the patient sustained to the right ankle back in January 2022.  Patient states that she sustained a trip and fall accident on 10/18/2020.  She did not have it evaluated until her 53-month follow-up exam by her PCP.  X-rays were taken on 12/12/2020 by the PCP and she was diagnosed with an avulsion fracture of the distal tip of the fibula.  She states that the pain can get 6-8/10 on a daily basis.    Patient states that she is continuing to have some pain.  She has good days and bad days but overall she does not feel that there is significant improvement.  She also sustained a new trip and fall injury when going down the stairs out of her house into the garage on 02/22/2021.  Even prior to her fall injury she was still having ankle pain however with recurrent sprains.   Most recently MRI was ordered and she presents to review the MRI results today options  Past Medical History:  Diagnosis Date   Allergy    Anemia    Cancer ([place])    Depression    Diverticulosis    FHx: migraine headaches    GERD (gastroesophageal reflux disease)    History of chickenpox    Hypercholesterolemia    Hyperlipidemia    Hypertension    Osteoarthritis    Panic attacks    Sleep apnea      Objective / Physical Exam:  General:  The patient is alert and oriented x3 in no acute distress. Dermatology:  Skin is warm, dry and supple bilateral lower extremities. Negative for open lesions or macerations. Vascular:  Palpable pedal pulses bilaterally. No edema or erythema noted. Capillary refill within normal limits. Neurological:  Epicritic and protective threshold grossly intact bilaterally.  Musculoskeletal Exam:  Clinically the patient continues to have pain on palpation to the lateral aspect of the patient's right ankle joint just overlying the lateral malleolus with moderate edema noted.  Chronic ankle instability with slight  positive anterior drawer  MR ANKLE RT WO CONTRAST 04/03/2021: LIGAMENTS Lateral: The anterior and posterior tibiofibular ligaments are intact. I do not see anterior talofibular ligament and I suspect is chronically torn. There is a rounded area of dark PD and T2 signal intensity in the anterolateral gutter. This discoid lesion can be seen with chronic ligamentous injury and can cause anterolateral impingement. The posterior talofibular ligament is intact. The calcaneal fibular ligament appears intact.  CARTILAGE Ankle Joint: Moderate degenerative changes with degenerative chondrosis, joint space narrowing, early spurring and small joint effusion. No osteochondral lesion.  IMPRESSION: 1. Intact medial, lateral and anterior ankle tendons. 2. 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 There is a discoid lesion in the lateral gutter (fibrous tissue) which can cause anterolateral impingement. 3. Moderate tibiotalar and subtalar joint degenerative changes. 4. No stress fracture or AVN.  Assessment: 1.  Avulsion fracture lateral malleolus RT.  DOI: 10/18/2020 2.  Chronic ankle instability 3.  DJD right ankle; please see attached MRI findings  Plan of Care:  1. Patient was evaluated.  MRI reviewed.  2. Today we discussed the conservative versus surgical management of the presenting pathology. The patient opts for surgical management. All possible complications and details of the procedure were explained. All patient questions were answered. No guarantees were expressed or implied. After lengthy discussion with the patient I do believe surgery would be in her best option to help  alleviate the patient symptoms.  Lateral ankle stabilization since the patient has chronic instability and recurrently rolls her ankle.  Verified by MRI with no visualization of the ATFL ligament. 3. Authorization for surgery was initiated today. Surgery will consist of ankle arthroscopy with debridement  right.  Lateral ankle stabilization with Arthrex internal brace right 4.  Return to clinic 1 week postop   Edrick Kins, DPM [facility]  Dr. Edrick Kins, DPM    [address], [place] [address]                Office [phone]  Fax [phone]

## 2021-05-14 ENCOUNTER — Other Ambulatory Visit: Payer: Self-pay | Admitting: Internal Medicine

## 2021-05-15 ENCOUNTER — Telehealth: Payer: Self-pay | Admitting: Urology

## 2021-05-15 NOTE — Telephone Encounter (Signed)
DOS - 05/30/21  ARTHROSCOPY ANKLE RIGHT --- [id number] REPAIR DISRUPTED LIGAMENT RIGHT --- [id number]   [employer] EFFECTIVE DATE - 10/13/06  PER [employer]IGNA'S AUTOMATIVE SYSTEM FOR CPT CODES 36644 AND 03474 NO PRIOR AUTH IS REQUIRED.   REF # [id number] REF # [id number]

## 2021-05-20 ENCOUNTER — Encounter: Payer: Self-pay | Admitting: Internal Medicine

## 2021-05-21 ENCOUNTER — Other Ambulatory Visit: Payer: Self-pay

## 2021-05-21 MED ORDER — SPIRONOLACTONE 25 MG PO TABS
25.0000 mg | ORAL_TABLET | Freq: Every day | ORAL | 2 refills | Status: DC
Start: 2021-05-21 — End: 2021-08-02

## 2021-05-30 ENCOUNTER — Telehealth: Payer: Self-pay | Admitting: Podiatry

## 2021-05-30 DIAGNOSIS — S82831A Other fracture of upper and lower end of right fibula, initial encounter for closed fracture: Secondary | ICD-10-CM

## 2021-05-30 NOTE — Telephone Encounter (Signed)
Patients husband called stating there was suppose to be pain meds called in at pharmacy. When he went to pick them up there was no meds there. They use [facility] in [place].

## 2021-05-31 ENCOUNTER — Other Ambulatory Visit: Payer: Self-pay | Admitting: Podiatry

## 2021-05-31 MED ORDER — OXYCODONE-ACETAMINOPHEN 5-325 MG PO TABS: 1.0000 | ORAL_TABLET | ORAL | 0 refills | Status: DC | PRN

## 2021-05-31 NOTE — Progress Notes (Signed)
PRN postop 

## 2021-06-05 ENCOUNTER — Encounter: Payer: Managed Care, Other (non HMO) | Admitting: Podiatry

## 2021-06-07 ENCOUNTER — Ambulatory Visit (INDEPENDENT_AMBULATORY_CARE_PROVIDER_SITE_OTHER): Payer: Managed Care, Other (non HMO) | Admitting: Podiatry

## 2021-06-07 ENCOUNTER — Ambulatory Visit (INDEPENDENT_AMBULATORY_CARE_PROVIDER_SITE_OTHER): Payer: Managed Care, Other (non HMO)

## 2021-06-07 ENCOUNTER — Other Ambulatory Visit: Payer: Self-pay

## 2021-06-07 ENCOUNTER — Encounter: Payer: Self-pay | Admitting: Podiatry

## 2021-06-07 VITALS — BP 127/64 | HR 72 | Temp 97.0°F

## 2021-06-07 DIAGNOSIS — Z9889 Other specified postprocedural states: Secondary | ICD-10-CM

## 2021-06-07 DIAGNOSIS — M25371 Other instability, right ankle: Secondary | ICD-10-CM

## 2021-06-07 MED ORDER — DOXYCYCLINE HYCLATE 100 MG PO TABS: 100.0000 mg | ORAL_TABLET | Freq: Two times a day (BID) | ORAL | 0 refills | Status: DC

## 2021-06-07 NOTE — Progress Notes (Signed)
   Subjective:  Patient presents today status post ankle arthroscopy with lateral ankle stabilization right. DOS: 05/30/2021.  Patient states she is in a significant amount of pain and tenderness.  She is taking ibuprofen for the pain.  The Percocet causes hallucinations.  She has been weightbearing in the cam boot as instructed.  Today the husband is somewhat frustrated because he was unable to contact the office regarding bleeding to the dressing and a dressing change over the past week.  They present for further treatment and evaluation  Past Medical History:  Diagnosis Date   Allergy    Anemia    Cancer ([place])    Depression    Diverticulosis    FHx: migraine headaches    GERD (gastroesophageal reflux disease)    History of chickenpox    Hypercholesterolemia    Hyperlipidemia    Hypertension    Osteoarthritis    Panic attacks    Sleep apnea       Objective/Physical Exam Neurovascular status intact.  Skin incisions appear to be well coapted with sutures intact.  Moderate edema noted to the surgical extremity.  No active bleeding.  There is some erythema localized around the incision site and extending around the lateral aspect of the right ankle.  Concerning for mild localized low-grade cellulitis postoperative infection  Radiographic Exam:  Normal osseous mineralization.  Joint spaces preserved.  Assessment: 1. s/p right ankle arthroscopy with Arthrex internal brace lateral ankle stabilization. DOS: 05/30/2021 2.  Cellulitis/postoperative infection right ankle   Plan of Care:  1. Patient was evaluated. X-rays reviewed 2.  Prescription for doxycycline 100 mg 2 times daily #20 3.  There was blood soaked to the cam boot.  New cam boot was dispensed today.  Weightbearing as tolerated 4.  Dressings changed today.  Dressing supplies also provided for the patient to change the dressings halfway through the week 5.  Return to clinic in 1 week   Edrick Kins, DPM [facility]  Dr. Edrick Kins, DPM    [address], [place] [address]                Office [phone]  Fax [phone]

## 2021-06-10 ENCOUNTER — Other Ambulatory Visit: Payer: Self-pay | Admitting: Internal Medicine

## 2021-06-10 DIAGNOSIS — Z1231 Encounter for screening mammogram for malignant neoplasm of breast: Secondary | ICD-10-CM

## 2021-06-12 ENCOUNTER — Encounter: Payer: Managed Care, Other (non HMO) | Admitting: Podiatry

## 2021-06-14 ENCOUNTER — Ambulatory Visit (INDEPENDENT_AMBULATORY_CARE_PROVIDER_SITE_OTHER): Payer: Managed Care, Other (non HMO) | Admitting: Podiatry

## 2021-06-14 ENCOUNTER — Other Ambulatory Visit: Payer: Self-pay

## 2021-06-14 ENCOUNTER — Other Ambulatory Visit: Payer: Self-pay | Admitting: Podiatry

## 2021-06-14 DIAGNOSIS — Z9889 Other specified postprocedural states: Secondary | ICD-10-CM

## 2021-06-14 MED ORDER — DOXYCYCLINE HYCLATE 100 MG PO TABS
100.0000 mg | ORAL_TABLET | Freq: Two times a day (BID) | ORAL | 0 refills | Status: DC
Start: 2021-06-14 — End: 2021-08-19

## 2021-06-22 ENCOUNTER — Other Ambulatory Visit: Payer: Self-pay | Admitting: Internal Medicine

## 2021-06-25 LAB — WOUND CULTURE

## 2021-06-26 ENCOUNTER — Encounter: Payer: Managed Care, Other (non HMO) | Admitting: Podiatry

## 2021-06-28 ENCOUNTER — Ambulatory Visit (INDEPENDENT_AMBULATORY_CARE_PROVIDER_SITE_OTHER): Payer: Managed Care, Other (non HMO) | Admitting: Podiatry

## 2021-06-28 ENCOUNTER — Other Ambulatory Visit: Payer: Self-pay

## 2021-06-28 DIAGNOSIS — Z9889 Other specified postprocedural states: Secondary | ICD-10-CM

## 2021-06-30 NOTE — Progress Notes (Signed)
   Subjective:  Patient presents today status post ankle arthroscopy with lateral ankle stabilization right. DOS: 05/30/2021.  Patient states that overall she is doing better.  She has continued drainage from the incision site.  She says the stitches are still intact.  She denies any shortness of breath nausea vomiting fever chest pains.  Past Medical History:  Diagnosis Date   Allergy    Anemia    Cancer ([place])    Depression    Diverticulosis    FHx: migraine headaches    GERD (gastroesophageal reflux disease)    History of chickenpox    Hypercholesterolemia    Hyperlipidemia    Hypertension    Osteoarthritis    Panic attacks    Sleep apnea       Objective/Physical Exam Neurovascular status intact.  Skin incisions appear to be well coapted with sutures intact.  Moderate edema noted to the surgical extremity.  No active bleeding.  There is some erythema localized around the incision site and extending around the lateral aspect of the right ankle.  Concerning for mild localized low-grade cellulitis postoperative infection   Assessment: 1. s/p right ankle arthroscopy with Arthrex internal brace lateral ankle stabilization. DOS: 05/30/2021 2.  Cellulitis/postoperative infection right ankle   Plan of Care:  1. Patient was evaluated. X-rays reviewed 2.  Refill prescription for doxycycline 100 mg 2 times daily #20 3.  Partial sutures were removed today 4.  Cultures were taken and sent to pathology for culture and sensitivity 5.  Continue minimal weightbearing in the cam boot 6.  Return to clinic in 2 weeks  Edrick Kins, DPM [facility]  Dr. Edrick Kins, DPM    [address], [place] [address]                Office [phone]  Fax [phone]

## 2021-07-02 NOTE — Progress Notes (Signed)
   Subjective:  Patient presents today status post ankle arthroscopy with lateral ankle stabilization right. DOS: 05/30/2021.  Patient states that she is doing much better.  She only has minor discomfort.  She is almost done taking oral antibiotics that were prescribed last visit. Past Medical History:  Diagnosis Date   Allergy    Anemia    Cancer ([place])    Depression    Diverticulosis    FHx: migraine headaches    GERD (gastroesophageal reflux disease)    History of chickenpox    Hypercholesterolemia    Hyperlipidemia    Hypertension    Osteoarthritis    Panic attacks    Sleep apnea       Objective/Physical Exam Neurovascular status intact.  Skin incisions appear to be well coapted with sutures intact.  Moderate edema noted to the surgical extremity.  No active bleeding.  The erythema around the incision site appears completely resolved.  The skin and tissue appears healthy with no erythema and minimal edema noted.  Good healing of the incision site   Assessment: 1. s/p right ankle arthroscopy with Arthrex internal brace lateral ankle stabilization. DOS: 05/30/2021 2.  Cellulitis/postoperative infection right ankle; resolved   Plan of Care:  1. Patient was evaluated. 2.  Continue the oral antibiotics as prescribed until completed doxycycline 100 mg 2 times daily #20  3.  Remaining sutures were removed today 4.  Silvadene cream provided for the patient to apply daily  5.  Patient may begin to transition out of the cam boot into good supportive sneakers  6.  Return to clinic in 3 weeks  Edrick Kins, DPM [facility]  Dr. Edrick Kins, DPM    [address], [place] [address]                Office [phone]  Fax [phone]

## 2021-07-19 ENCOUNTER — Ambulatory Visit (INDEPENDENT_AMBULATORY_CARE_PROVIDER_SITE_OTHER): Payer: Managed Care, Other (non HMO) | Admitting: Podiatry

## 2021-07-19 ENCOUNTER — Other Ambulatory Visit: Payer: Self-pay

## 2021-07-19 ENCOUNTER — Encounter: Payer: Self-pay | Admitting: Podiatry

## 2021-07-19 DIAGNOSIS — Z9889 Other specified postprocedural states: Secondary | ICD-10-CM

## 2021-07-19 MED ORDER — TRIAMCINOLONE ACETONIDE 0.1 % EX CREA: 1.0000 "application " | TOPICAL_CREAM | Freq: Two times a day (BID) | CUTANEOUS | 0 refills | Status: DC

## 2021-07-25 ENCOUNTER — Encounter: Payer: Self-pay | Admitting: Internal Medicine

## 2021-07-31 ENCOUNTER — Encounter: Payer: Self-pay | Admitting: Internal Medicine

## 2021-07-31 ENCOUNTER — Telehealth: Payer: Self-pay | Admitting: Podiatry

## 2021-07-31 NOTE — Telephone Encounter (Signed)
Pt call she need you to send the medication to the [facility] in [place]  [place]  the triamcinolone cream

## 2021-08-02 ENCOUNTER — Other Ambulatory Visit: Payer: Self-pay

## 2021-08-02 MED ORDER — PANTOPRAZOLE SODIUM 40 MG PO TBEC: 40.0000 mg | DELAYED_RELEASE_TABLET | Freq: Every day | ORAL | 1 refills | Status: DC

## 2021-08-02 MED ORDER — AMLODIPINE BESYLATE 10 MG PO TABS: ORAL_TABLET | ORAL | 5 refills | Status: DC

## 2021-08-02 MED ORDER — ATORVASTATIN CALCIUM 20 MG PO TABS: ORAL_TABLET | ORAL | 1 refills | Status: DC

## 2021-08-02 MED ORDER — BUSPIRONE HCL 5 MG PO TABS: ORAL_TABLET | ORAL | 1 refills | Status: DC

## 2021-08-02 MED ORDER — CITALOPRAM HYDROBROMIDE 40 MG PO TABS: ORAL_TABLET | ORAL | 1 refills | Status: DC

## 2021-08-02 MED ORDER — LISINOPRIL 40 MG PO TABS: ORAL_TABLET | ORAL | 5 refills | Status: DC

## 2021-08-02 MED ORDER — CONTOUR NEXT TEST VI STRP: ORAL_STRIP | 1 refills | Status: DC

## 2021-08-02 MED ORDER — SPIRONOLACTONE 25 MG PO TABS: 25.0000 mg | ORAL_TABLET | Freq: Every day | ORAL | 2 refills | Status: DC

## 2021-08-02 MED ORDER — ONDANSETRON 4 MG PO TBDP: 4.0000 mg | ORAL_TABLET | Freq: Two times a day (BID) | ORAL | 0 refills | Status: DC | PRN

## 2021-08-04 ENCOUNTER — Other Ambulatory Visit: Payer: Self-pay | Admitting: Podiatry

## 2021-08-04 MED ORDER — TRIAMCINOLONE ACETONIDE 0.1 % EX CREA: 1.0000 "application " | TOPICAL_CREAM | Freq: Two times a day (BID) | CUTANEOUS | 1 refills | Status: DC

## 2021-08-04 NOTE — Telephone Encounter (Signed)
Prescription sent.  Please inform the patient.  Thanks, Dr. Amalia Hailey

## 2021-08-04 NOTE — Progress Notes (Signed)
   Subjective:  Patient presents today status post ankle arthroscopy with lateral ankle stabilization right. DOS: 05/30/2021.  Patient states that she is doing much better.  She only has minor discomfort.  She has been itching lately with the skin around the top of her foot.  Otherwise she is doing very well  Past Medical History:  Diagnosis Date   Allergy    Anemia    Cancer ([place])    Depression    Diverticulosis    FHx: migraine headaches    GERD (gastroesophageal reflux disease)    History of chickenpox    Hypercholesterolemia    Hyperlipidemia    Hypertension    Osteoarthritis    Panic attacks    Sleep apnea       Objective/Physical Exam Neurovascular status intact.  Skin incisions appear to be well coapted and healed.  Minimal edema noted to the surgical extremity.  No active bleeding.  The erythema around the incision site appears completely resolved.  The skin and tissue appears healthy with no erythema and minimal edema noted.  Good healing of the incision site Patient is experiencing some dry skin with itching.  No open lesions noted however.   Assessment: 1. s/p right ankle arthroscopy with Arthrex internal brace lateral ankle stabilization. DOS: 05/30/2021    Plan of Care:  1. Patient was evaluated. 2.  Overall the patient is doing very well.  She continues to have some very mild itching but at this point she may resume full activity no restrictions 3.  Continue the compression ankle sleeve as needed 4.  Prescription for triamcinolone cream to help with the dry itching skin 5.  Return to clinic 6 weeks  Edrick Kins, DPM [facility]  Dr. Edrick Kins, DPM    [address], [place] [address]                Office [phone]  Fax [phone]

## 2021-08-19 ENCOUNTER — Ambulatory Visit (INDEPENDENT_AMBULATORY_CARE_PROVIDER_SITE_OTHER): Payer: Managed Care, Other (non HMO) | Admitting: Internal Medicine

## 2021-08-19 ENCOUNTER — Other Ambulatory Visit: Payer: Self-pay

## 2021-08-19 VITALS — BP 118/68 | HR 90 | Temp 97.9°F | Resp 16 | Ht 68.0 in | Wt 273.4 lb

## 2021-08-19 DIAGNOSIS — K219 Gastro-esophageal reflux disease without esophagitis: Secondary | ICD-10-CM

## 2021-08-19 DIAGNOSIS — G4730 Sleep apnea, unspecified: Secondary | ICD-10-CM

## 2021-08-19 DIAGNOSIS — R739 Hyperglycemia, unspecified: Secondary | ICD-10-CM | POA: Diagnosis not present

## 2021-08-19 DIAGNOSIS — I10 Essential (primary) hypertension: Secondary | ICD-10-CM

## 2021-08-19 DIAGNOSIS — Z23 Encounter for immunization: Secondary | ICD-10-CM | POA: Diagnosis not present

## 2021-08-19 DIAGNOSIS — F419 Anxiety disorder, unspecified: Secondary | ICD-10-CM

## 2021-08-19 DIAGNOSIS — E7800 Pure hypercholesterolemia, unspecified: Secondary | ICD-10-CM | POA: Diagnosis not present

## 2021-08-19 LAB — HEPATIC FUNCTION PANEL
ALT: 23 U/L (ref 0–35)
AST: 23 U/L (ref 0–37)
Albumin: 4.6 g/dL (ref 3.5–5.2)
Alkaline Phosphatase: 53 U/L (ref 39–117)
Bilirubin, Direct: 0.1 mg/dL (ref 0.0–0.3)
Total Bilirubin: 0.5 mg/dL (ref 0.2–1.2)
Total Protein: 7.3 g/dL (ref 6.0–8.3)

## 2021-08-19 LAB — LIPID PANEL
Cholesterol: 163 mg/dL (ref 0–200)
HDL: 51.1 mg/dL (ref >39.00)
LDL Cholesterol: 92 mg/dL (ref 0–99)
NonHDL: 112.36
Total CHOL/HDL Ratio: 3
Triglycerides: 101 mg/dL (ref 0.0–149.0)
VLDL: 20.2 mg/dL (ref 0.0–40.0)

## 2021-08-19 LAB — BASIC METABOLIC PANEL
BUN: 13 mg/dL (ref 6–23)
CO2: 25 mEq/L (ref 19–32)
Calcium: 9.5 mg/dL (ref 8.4–10.5)
Chloride: 104 mEq/L (ref 96–112)
Creatinine, Ser: 0.7 mg/dL (ref 0.40–1.20)
GFR: 92.68 mL/min (ref >60.00)
Glucose, Bld: 109 mg/dL — ABNORMAL HIGH (ref 70–99)
Potassium: 4.4 mEq/L (ref 3.5–5.1)
Sodium: 140 mEq/L (ref 135–145)

## 2021-08-19 LAB — HEMOGLOBIN A1C: Hgb A1c MFr Bld: 6.3 % (ref 4.6–6.5)

## 2021-08-19 NOTE — Progress Notes (Signed)
[name], female   DOB: 02/11/1961, 60 y.o.   MRN: [id number]   Subjective:    Patient ID: DEZERAY PUCCIO, female    DOB: [birth year], 60 y.o.   MRN: [id number]  This visit occurred during the SARS-CoV-2 public health emergency.  Safety protocols were in place, including screening questions prior to the visit, additional usage of staff PPE, and extensive cleaning of exam room while observing appropriate contact time as indicated for disinfecting solutions.   Patient here for a scheduled follow up.    HPI Here to follow up regarding her blood sugar, cholesterol and blood pressure.  Recently had right foot surgery.  Being followed by podiatry.  Not requiring a boot now.  Hopefully can get back into more of her physical routine.  Discussed diet and exercise.  Weight is up.  No chest pain.  Breathing appears to be stable.  No abdominal pain.  Bowels moving.  Blood pressures reviewed.  Appears to be doing well.  (Most ranging 110-120s/60-70s).  Overall appears to be handling stress relatively well.    Past Medical History:  Diagnosis Date   Allergy    Anemia    Cancer ([place])    Depression    Diverticulosis    FHx: migraine headaches    GERD (gastroesophageal reflux disease)    History of chickenpox    Hypercholesterolemia    Hyperlipidemia    Hypertension    Osteoarthritis    Panic attacks    Sleep apnea    Past Surgical History:  Procedure Laterality Date   ABDOMINAL HYSTERECTOMY     CATARACT EXTRACTION W/PHACO Left 08/11/2017   Procedure: CATARACT EXTRACTION PHACO AND INTRAOCULAR LENS PLACEMENT ([place]);  Surgeon: Birder Robson, MD;  Location: [facility] ORS;  Service: Ophthalmology;  Laterality: Left;  [place] 00:31 AP% 19.2 CDE 6.13 Fluid Pack lot # [id number]   CATARACT EXTRACTION W/PHACO Right 09/01/2017   Procedure: CATARACT EXTRACTION PHACO AND INTRAOCULAR LENS PLACEMENT ([facility]);  Surgeon: Birder Robson, MD;  Location: [facility] ORS;  Service: Ophthalmology;  Laterality:  Right;  [place] 00:30 AP% 11.7 CDE3.52 fluid pack lot #[id number]   Child Birth  1983 and 1981   COLONOSCOPY     COLONOSCOPY WITH PROPOFOL N/A 08/25/2018   Procedure: COLONOSCOPY WITH PROPOFOL;  Surgeon: Toledo, Benay Pike, MD;  Location: [facility] END[facility];  Service: Gastroenterology;  Laterality: N/A;   ESOPHAGOGASTRODUODENOSCOPY     ESOPHAGOGASTRODUODENOSCOPY (EGD) WITH PROPOFOL N/A 08/25/2018   Procedure: ESOPHAGOGASTRODUODENOSCOPY ([facility]) WITH PROPOFOL;  Surgeon: Toledo, Benay Pike, MD;  Location: [facility] ENDOS[facility];  Service: Gastroenterology;  Laterality: N/A;   FRACTURE SURGERY     LAPAROSCOPIC SUPRACERVICAL HYSTERECTOMY  2006   ovaries left in place   left elbow     left knee     Miscarriage  [place]   Family History  Problem Relation Age of Onset   Heart disease Mother        Incomplete heart block   Hypertension Mother    Diabetes Mother    Nephrolithiasis Mother    Migraines Mother    Arthritis Mother        degenerative-back,osteoporosis   Heart failure Mother    Heart disease Father 27       Myocardial infarction   Heart failure Father    Heart disease Sister        h/o MI   Migraines Sister    Breast cancer Neg Hx  Social History   Socioeconomic History   Marital status: Married    Spouse name: Not on file   Number of children: Not on file   Years of education: Not on file   Highest education level: Not on file  Occupational History   Not on file  Tobacco Use   Smoking status: Former    Packs/day: 0.10    Years: 1.50    Pack years: 0.15    Types: Cigarettes   Smokeless tobacco: Never  Vaping Use   Vaping Use: Never used  Substance and Sexual Activity   Alcohol use: No   Drug use: No   Sexual activity: Not on file  Other Topics Concern   Not on file  Social History Narrative   Not on file   Social Determinants of Health   Financial Resource Strain: Not on file  Food Insecurity: Not on file  Transportation  Needs: Not on file  Physical Activity: Not on file  Stress: Not on file  Social Connections: Not on file     Review of Systems  Constitutional:  Negative for appetite change and unexpected weight change.  HENT:  Negative for congestion and sinus pressure.   Respiratory:  Negative for cough, chest tightness and shortness of breath.   Cardiovascular:  Negative for chest pain and palpitations.  Gastrointestinal:  Negative for abdominal pain, diarrhea, nausea and vomiting.  Genitourinary:  Negative for difficulty urinating and dysuria.  Musculoskeletal:  Negative for joint swelling and myalgias.  Skin:  Negative for color change and rash.  Neurological:  Negative for dizziness, light-headedness and headaches.  Psychiatric/Behavioral:  Negative for agitation and dysphoric mood.       Objective:     BP 118/68   Pulse 90   Temp 97.9 F (36.6 C)   Resp 16   Ht 5' 8"  (1.727 m)   Wt 273 lb 6.4 oz (124 kg)   SpO2 98%   BMI 41.57 kg/m  Wt Readings from Last 3 Encounters:  08/19/21 273 lb 6.4 oz (124 kg)  04/17/21 267 lb 6.4 oz (121.3 kg)  04/03/21 260 lb (117.9 kg)    Physical Exam Vitals reviewed.  Constitutional:      General: She is not in acute distress.    Appearance: Normal appearance.  HENT:     Head: Normocephalic and atraumatic.     Right Ear: External ear normal.     Left Ear: External ear normal.  Eyes:     General: No scleral icterus.       Right eye: No discharge.        Left eye: No discharge.     Conjunctiva/sclera: Conjunctivae normal.  Neck:     Thyroid: No thyromegaly.  Cardiovascular:     Rate and Rhythm: Normal rate and regular rhythm.  Pulmonary:     Effort: No respiratory distress.     Breath sounds: Normal breath sounds. No wheezing.  Abdominal:     General: Bowel sounds are normal.     Palpations: Abdomen is soft.     Tenderness: There is no abdominal tenderness.  Musculoskeletal:        General: No swelling or tenderness.     Cervical  back: Neck supple. No tenderness.  Lymphadenopathy:     Cervical: No cervical adenopathy.  Skin:    Findings: No erythema or rash.  Neurological:     Mental Status: She is alert.  Psychiatric:        Mood and Affect:  Mood normal.        Behavior: Behavior normal.     Outpatient Encounter Medications as of 08/19/2021  Medication Sig   acetaminophen (TYLENOL) 650 MG CR tablet Take 650 mg by mouth every 8 (eight) hours as needed for pain.   amLODipine (NORVASC) 10 MG tablet TAKE 1 TABLET(10 MG) BY MOUTH DAILY   atorvastatin (LIPITOR) 40 MG tablet Take 1 tablet (40 mg total) by mouth daily. TAKE 1 TABLET(20 MG) BY MOUTH DAILY   Blood Glucose Monitoring Suppl (CONTOUR NEXT MONITOR) w/Device KIT 1 kit by Does not apply route 2 (two) times daily. DX: E11.9   busPIRone (BUSPAR) 5 MG tablet TAKE 1 TABLET(5 MG) BY MOUTH DAILY AS NEEDED   citalopram (CELEXA) 40 MG tablet TAKE 1 TABLET BY MOUTH DAILY. GENERIC EQUIVALENT FOR CELEXA   glucose blood (CONTOUR NEXT TEST) test strip AS DIRECTED TWICE DAILY   lisinopril (ZESTRIL) 40 MG tablet TAKE 1 TABLET(40 MG) BY MOUTH DAILY   Microlet Lancets MISC USE TO CHECK SUGAR TWICE DAILY   ondansetron (ZOFRAN ODT) 4 MG disintegrating tablet Take 1 tablet (4 mg total) by mouth 2 (two) times daily as needed for nausea or vomiting.   ondansetron (ZOFRAN) 4 MG tablet Take 4 mg by mouth every 8 (eight) hours as needed for nausea or vomiting.   pantoprazole (PROTONIX) 40 MG tablet Take 1 tablet (40 mg total) by mouth daily.   spironolactone (ALDACTONE) 25 MG tablet Take 1 tablet (25 mg total) by mouth daily.   [DISCONTINUED] atorvastatin (LIPITOR) 20 MG tablet TAKE 1 TABLET(20 MG) BY MOUTH DAILY   [DISCONTINUED] doxycycline (VIBRA-TABS) 100 MG tablet Take 1 tablet (100 mg total) by mouth 2 (two) times daily.   [DISCONTINUED] meloxicam (MOBIC) 15 MG tablet Take 1 tablet (15 mg total) by mouth daily.   [DISCONTINUED] oxyCODONE-acetaminophen (PERCOCET) 5-325 MG tablet  Take 1 tablet by mouth every 4 (four) hours as needed for severe pain.   [DISCONTINUED] triamcinolone cream (KENALOG) 0.1 % Apply 1 application topically 2 (two) times daily.   No facility-administered encounter medications on file as of 08/19/2021.     Lab Results  Component Value Date   WBC 7.6 04/17/2021   HGB 14.1 04/17/2021   HCT 41.1 04/17/2021   PLT 254.0 04/17/2021   GLUCOSE 109 (H) 08/19/2021   CHOL 163 08/19/2021   TRIG 101.0 08/19/2021   HDL 51.10 08/19/2021   LDLCALC 92 08/19/2021   ALT 23 08/19/2021   AST 23 08/19/2021   NA 140 08/19/2021   K 4.4 08/19/2021   CL 104 08/19/2021   CREATININE 0.70 08/19/2021   BUN 13 08/19/2021   CO2 25 08/19/2021   TSH 0.78 12/12/2020   HGBA1C 6.3 08/19/2021    MR ANKLE RIGHT WO CONTRAST  Result Date: 04/04/2021 CLINICAL DATA:  Patient fell in January 2022. Persistent pain and swelling. EXAM: MRI OF THE RIGHT ANKLE WITHOUT CONTRAST TECHNIQUE: Multiplanar, multisequence MR imaging of the ankle was performed. No intravenous contrast was administered. COMPARISON:  Radiographs 02/08/2021 FINDINGS: TENDONS Peroneal: Intact Posteromedial: Intact Anterior: Intact Achilles: Normal Plantar Fascia: Intact.  No findings for plantar fasciitis. LIGAMENTS Lateral: The anterior and posterior tibiofibular ligaments are intact. I do not see anterior talofibular ligament and I suspect is chronically torn. There is a rounded area of dark PD and T2 signal intensity in the anterolateral gutter. This discoid lesion can be seen with chronic ligamentous injury and can cause anterolateral impingement. The posterior talofibular ligament is intact. The calcaneal fibular  ligament appears intact. Medial: Intact CARTILAGE Ankle Joint: Moderate degenerative changes with degenerative chondrosis, joint space narrowing, early spurring and small joint effusion. No osteochondral lesion. Subtalar Joints/Sinus Tarsi: Moderate subtalar joint degenerative changes. There is an  effusion in the posterior talocalcaneal facet. The sinus tarsi is unremarkable. Bones: No stress fracture or AVN. No bone lesions. Mild midfoot degenerative changes are noted. Other: Unremarkable foot and ankle musculature. IMPRESSION: 1. Intact medial, lateral and anterior ankle tendons. 2. 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 There is a discoid lesion in the lateral gutter (fibrous tissue) which can cause anterolateral impingement. 3. Moderate tibiotalar and subtalar joint degenerative changes. 4. No stress fracture or AVN. Electronically Signed   By: Marijo Sanes M.D.   On: 04/04/2021 16:34       Assessment & Plan:   Problem List Items Addressed This Visit     Anxiety    Continue citalopram.  Discussed.  Overall appears to be doing relatively well.  Follow.        Essential hypertension, benign - Primary    Continue amlodipine, lisinopril and Aldactone.  Blood pressures doing well.  Reviewed outside blood pressure checks and these were averaging 120s over 60- 70s. Follow pressures.  Follow metabolic panel.      Relevant Medications   atorvastatin (LIPITOR) 40 MG tablet   Other Relevant Orders   Basic metabolic panel (Completed)   GERD (gastroesophageal reflux disease)    No upper symptoms reported.  On Protonix.      Hypercholesterolemia    On Lipitor.  Low-cholesterol diet and exercise.  Follow lipid panel liver function testing.      Relevant Medications   atorvastatin (LIPITOR) 40 MG tablet   Other Relevant Orders   Hepatic function panel (Completed)   Lipid panel (Completed)   Hyperglycemia    Low-carb diet and exercise.  Follow metb and A1c.      Relevant Orders   Hemoglobin A1c (Completed)   Sleep apnea    CPAP.       Other Visit Diagnoses     Need for immunization against influenza       Relevant Orders   Flu Vaccine QUAD 59moIM (Fluarix, Fluzone & Alfiuria Quad PF) (Completed)        CEinar Pheasant MD

## 2021-08-20 ENCOUNTER — Telehealth: Payer: Self-pay

## 2021-08-20 NOTE — Telephone Encounter (Signed)
-----   Message from Einar Pheasant, MD sent at 08/20/2021  3:29 AM EST ----- Notify - total and bad cholesterol has improved some when compared to the previous check.  Goal LDL 70s.  Confirm she is on lipitor 20mg  q day.  If so and if agreeable, would like to increase lipitor to 40mg  q day.  Overall sugar control has increased from previous check.  I recommend a low carb diet and exercise.  We will follow.  Kidney function tests and liver function tests are wnl.

## 2021-08-21 MED ORDER — ATORVASTATIN CALCIUM 40 MG PO TABS: 40.0000 mg | ORAL_TABLET | Freq: Every day | ORAL | 1 refills | Status: DC

## 2021-08-25 ENCOUNTER — Encounter: Payer: Self-pay | Admitting: Internal Medicine

## 2021-08-25 NOTE — Assessment & Plan Note (Signed)
No upper symptoms reported.  On Protonix. 

## 2021-08-25 NOTE — Assessment & Plan Note (Signed)
On Lipitor.  Low-cholesterol diet and exercise.  Follow lipid panel liver function testing.

## 2021-08-25 NOTE — Assessment & Plan Note (Signed)
Low-carb diet and exercise.  Follow met b and A1c. ?

## 2021-08-25 NOTE — Assessment & Plan Note (Signed)
Continue citalopram.  Discussed.  Overall appears to be doing relatively well.  Follow.

## 2021-08-25 NOTE — Assessment & Plan Note (Signed)
CPAP.  

## 2021-08-25 NOTE — Assessment & Plan Note (Signed)
Continue amlodipine, lisinopril and Aldactone.  Blood pressures doing well.  Reviewed outside blood pressure checks and these were averaging 120s over 60- 70s. Follow pressures.  Follow metabolic panel.

## 2021-08-27 ENCOUNTER — Ambulatory Visit (INDEPENDENT_AMBULATORY_CARE_PROVIDER_SITE_OTHER): Payer: Managed Care, Other (non HMO) | Admitting: Podiatry

## 2021-08-27 ENCOUNTER — Other Ambulatory Visit: Payer: Self-pay

## 2021-08-27 DIAGNOSIS — Z9889 Other specified postprocedural states: Secondary | ICD-10-CM

## 2021-08-27 NOTE — Progress Notes (Signed)
   Subjective:  Patient presents today status post ankle arthroscopy with lateral ankle stabilization right. DOS: 05/30/2021.  Patient states that she is doing much better.  Otherwise she is doing very well patient states that overall she is significantly better.  She feels an increased amount of stability to the ankle joint.  She is able to walk in regular shoes without any pain.  Past Medical History:  Diagnosis Date   Allergy    Anemia    Cancer ([place])    Depression    Diverticulosis    FHx: migraine headaches    GERD (gastroesophageal reflux disease)    History of chickenpox    Hypercholesterolemia    Hyperlipidemia    Hypertension    Osteoarthritis    Panic attacks    Sleep apnea       Objective/Physical Exam Neurovascular status intact.  Skin incisions appear to be well coapted and healed.  Negative for any edema noted to the surgical extremity.  No active bleeding.  The erythema around the incision site appears completely resolved.  The skin and tissue appears healthy with no erythema and minimal edema noted.  Good healing of the incision site    Assessment: 1. s/p right ankle arthroscopy with Arthrex internal brace lateral ankle stabilization. DOS: 05/30/2021    Plan of Care:  1. Patient was evaluated. 2.  Overall the patient is doing very well.  Patient states that overall she feels significant improvements in stability of her ankle.  She does have some intermittent achiness if she is on her feet for several hours. 3.  Patient may now resume full activity no restrictions 4.  Return to clinic as needed  Edrick Kins, DPM [facility]  Dr. Edrick Kins, DPM    [address], [place] [address]                Office [phone]  Fax [phone]

## 2021-09-10 ENCOUNTER — Other Ambulatory Visit: Payer: Self-pay

## 2021-09-10 ENCOUNTER — Ambulatory Visit
Admission: RE | Admit: 2021-09-10 | Discharge: 2021-09-10 | Disposition: A | Discharge status: Home / Self Care | Payer: Managed Care, Other (non HMO) | Source: Ambulatory Visit | Attending: Internal Medicine | Admitting: Internal Medicine

## 2021-09-10 DIAGNOSIS — Z1231 Encounter for screening mammogram for malignant neoplasm of breast: Secondary | ICD-10-CM

## 2021-12-18 ENCOUNTER — Other Ambulatory Visit: Payer: Self-pay | Admitting: Internal Medicine

## 2021-12-19 ENCOUNTER — Ambulatory Visit (INDEPENDENT_AMBULATORY_CARE_PROVIDER_SITE_OTHER): Payer: Managed Care, Other (non HMO) | Admitting: Internal Medicine

## 2021-12-19 ENCOUNTER — Encounter: Payer: Self-pay | Admitting: Internal Medicine

## 2021-12-19 ENCOUNTER — Other Ambulatory Visit: Payer: Self-pay

## 2021-12-19 VITALS — BP 130/70 | HR 95 | Temp 98.0°F | Resp 16 | Ht 67.0 in | Wt 268.0 lb

## 2021-12-19 DIAGNOSIS — I10 Essential (primary) hypertension: Secondary | ICD-10-CM

## 2021-12-19 DIAGNOSIS — E7800 Pure hypercholesterolemia, unspecified: Secondary | ICD-10-CM

## 2021-12-19 DIAGNOSIS — F419 Anxiety disorder, unspecified: Secondary | ICD-10-CM

## 2021-12-19 DIAGNOSIS — R079 Chest pain, unspecified: Secondary | ICD-10-CM

## 2021-12-19 DIAGNOSIS — K219 Gastro-esophageal reflux disease without esophagitis: Secondary | ICD-10-CM

## 2021-12-19 DIAGNOSIS — R739 Hyperglycemia, unspecified: Secondary | ICD-10-CM

## 2021-12-19 DIAGNOSIS — G4730 Sleep apnea, unspecified: Secondary | ICD-10-CM

## 2021-12-19 LAB — HEPATIC FUNCTION PANEL
ALT: 28 U/L (ref 0–35)
AST: 29 U/L (ref 0–37)
Albumin: 4.8 g/dL (ref 3.5–5.2)
Alkaline Phosphatase: 63 U/L (ref 39–117)
Bilirubin, Direct: 0.1 mg/dL (ref 0.0–0.3)
Total Bilirubin: 0.6 mg/dL (ref 0.2–1.2)
Total Protein: 7.6 g/dL (ref 6.0–8.3)

## 2021-12-19 LAB — LIPID PANEL
Cholesterol: 154 mg/dL (ref 0–200)
HDL: 60.2 mg/dL (ref >39.00)
LDL Cholesterol: 76 mg/dL (ref 0–99)
NonHDL: 93.57
Total CHOL/HDL Ratio: 3
Triglycerides: 89 mg/dL (ref 0.0–149.0)
VLDL: 17.8 mg/dL (ref 0.0–40.0)

## 2021-12-19 LAB — BASIC METABOLIC PANEL
BUN: 12 mg/dL (ref 6–23)
CO2: 27 mEq/L (ref 19–32)
Calcium: 10.1 mg/dL (ref 8.4–10.5)
Chloride: 99 mEq/L (ref 96–112)
Creatinine, Ser: 0.78 mg/dL (ref 0.40–1.20)
GFR: 81.2 mL/min (ref >60.00)
Glucose, Bld: 100 mg/dL — ABNORMAL HIGH (ref 70–99)
Potassium: 4.3 mEq/L (ref 3.5–5.1)
Sodium: 136 mEq/L (ref 135–145)

## 2021-12-19 LAB — HEMOGLOBIN A1C: Hgb A1c MFr Bld: 6 % (ref 4.6–6.5)

## 2021-12-19 LAB — TSH: TSH: 1.09 u[IU]/mL (ref 0.35–5.50)

## 2021-12-19 MED ORDER — BUSPIRONE HCL 5 MG PO TABS: 5.0000 mg | ORAL_TABLET | Freq: Two times a day (BID) | ORAL | 1 refills | Status: DC

## 2021-12-19 NOTE — Progress Notes (Unsigned)
[name], female   DOB: [birth year], 61 y.o.   MRN: [id number]   Subjective:    [name], female    DOB: 1961-10-01, 61 y.o.   MRN: [id number]  This visit occurred during the SARS-CoV-2 public health emergency.  Safety protocols were in place, including screening questions prior to the visit, additional usage of staff PPE, and extensive cleaning of exam room while observing appropriate contact time as indicated for disinfecting solutions.   Patient here for a scheduled follow up.  Marland Kitchen   HPI Here to follow up regarding her blood pressure and cholesterol.  Also s/p ankle surgery 05/2021.  Doing better.    Past Medical History:  Diagnosis Date   Allergy    Anemia    Cancer ([place])    Depression    Diverticulosis    FHx: migraine headaches    GERD (gastroesophageal reflux disease)    History of chickenpox    Hypercholesterolemia    Hyperlipidemia    Hypertension    Osteoarthritis    Panic attacks    Sleep apnea    Past Surgical History:  Procedure Laterality Date   ABDOMINAL HYSTERECTOMY     CATARACT EXTRACTION W/PHACO Left 08/11/2017   Procedure: CATARACT EXTRACTION PHACO AND INTRAOCULAR LENS PLACEMENT ([place]);  Surgeon: Birder Robson, MD;  Location: [facility] OR[facility];  Service: Ophthalmology;  Laterality: Left;  [place] 00:31 AP% 19.2 CDE 6.13 Fluid Pack lot # [id number]   CATARACT EXTRACTION W/PHACO Right 09/01/2017   Procedure: CATARACT EXTRACTION PHACO AND INTRAOCULAR LENS PLACEMENT ([facility]);  Surgeon: Birder Robson, MD;  Location: [facility];  Service: Ophthalmology;  Laterality: Right;  [place] 00:30 AP% 11.7 CDE3.52 fluid pack lot #[id number] H   Child Birth  1983 and 1979   COLONOSCOPY     COLONOSCOPY WITH PROPOFOL N/A 08/25/2018   Procedure: COLONOSCOPY WITH PROPOFOL;  Surgeon: Toledo, Benay Pike, MD;  Location: [facility];  Service: Gastroenterology;  Laterality: N/A;   ESOPHAGOGASTRODUODENOSCOPY     ESOPHAGOGASTRODUODENOSCOPY (EGD) WITH PROPOFOL N/A  08/25/2018   Procedure: ESOPHAGOGASTRODUODENOSCOPY (EGD) WITH PROPOFOL;  Surgeon: Toledo, Benay Pike, MD;  Location: [facility];  Service: Gastroenterology;  Laterality: N/A;   FRACTURE SURGERY     LAPAROSCOPIC SUPRACERVICAL HYSTERECTOMY  2006   ovaries left in place   left elbow     left knee     Miscarriage  [place]   Family History  Problem Relation Age of Onset   Heart disease Mother        Incomplete heart block   Hypertension Mother    Diabetes Mother    Nephrolithiasis Mother    Migraines Mother    Arthritis Mother        degenerative-back,osteoporosis   Heart failure Mother    Heart disease Father 39       Myocardial infarction   Heart failure Father    Heart disease Sister        h/o MI   Migraines Sister    Breast cancer Neg Hx    Social History   Socioeconomic History   Marital status: Married    Spouse name: Not on file   Number of children: Not on file   Years of education: Not on file   Highest education level: Not on file  Occupational History   Not on file  Tobacco Use   Smoking status: Former    Packs/day: 0.10    Years: 1.50  Pack years: 0.15    Types: Cigarettes   Smokeless tobacco: Never  Vaping Use   Vaping Use: Never used  Substance and Sexual Activity   Alcohol use: No   Drug use: No   Sexual activity: Not on file  Other Topics Concern   Not on file  Social History Narrative   Not on file   Social Determinants of Health   Financial Resource Strain: Not on file  Food Insecurity: Not on file  Transportation Needs: Not on file  Physical Activity: Not on file  Stress: Not on file  Social Connections: Not on file     Review of Systems     Objective:     BP 130/70    Pulse 95    Temp 98 F (36.7 C)    Resp 16    Ht _0  (1.702 m)    Wt 268 lb (121.6 kg)    SpO2 98%    BMI 41.97 kg/m  Wt Readings from Last 3 Encounters:  12/19/21 268 lb (121.6 kg)  08/19/21 273 lb 6.4 oz  (124 kg)  04/17/21 267 lb 6.4 oz (121.3 kg)    Physical Exam   Outpatient Encounter Medications as of 12/19/2021  Medication Sig   acetaminophen (TYLENOL) 650 MG CR tablet Take 650 mg by mouth every 8 (eight) hours as needed for pain.   amLODipine (NORVASC) 10 MG tablet TAKE 1 TABLET(10 MG) BY MOUTH DAILY   atorvastatin (LIPITOR) 40 MG tablet Take 1 tablet (40 mg total) by mouth daily. TAKE 1 TABLET(20 MG) BY MOUTH DAILY   Blood Glucose Monitoring Suppl (CONTOUR NEXT MONITOR) w/Device KIT 1 kit by Does not apply route 2 (two) times daily. DX: E11.9   busPIRone (BUSPAR) 5 MG tablet TAKE 1 TABLET(5 MG) BY MOUTH DAILY AS NEEDED   citalopram (CELEXA) 40 MG tablet TAKE 1 TABLET BY MOUTH DAILY. GENERIC EQUIVALENT FOR CELEXA   glucose blood (CONTOUR NEXT TEST) test strip USE 1 STRIP TO CHECK GLUCOSE TWICE DAILY   lisinopril (ZESTRIL) 40 MG tablet TAKE 1 TABLET(40 MG) BY MOUTH DAILY   Microlet Lancets MISC USE TO CHECK SUGAR TWICE DAILY   ondansetron (ZOFRAN ODT) 4 MG disintegrating tablet Take 1 tablet (4 mg total) by mouth 2 (two) times daily as needed for nausea or vomiting.   ondansetron (ZOFRAN) 4 MG tablet Take 4 mg by mouth every 8 (eight) hours as needed for nausea or vomiting.   pantoprazole (PROTONIX) 40 MG tablet Take 1 tablet (40 mg total) by mouth daily.   spironolactone (ALDACTONE) 25 MG tablet Take 1 tablet (25 mg total) by mouth daily.   No facility-administered encounter medications on file as of 12/19/2021.     Lab Results  Component Value Date   WBC 7.6 04/17/2021   HGB 14.1 04/17/2021   HCT 41.1 04/17/2021   PLT 254.0 04/17/2021   GLUCOSE 109 (H) 08/19/2021   CHOL 163 08/19/2021   TRIG 101.0 08/19/2021   HDL 51.10 08/19/2021   LDLCALC 92 08/19/2021   ALT 23 08/19/2021   AST 23 08/19/2021   NA 140 08/19/2021   K 4.4 08/19/2021   CL 104 08/19/2021   CREATININE 0.70 08/19/2021   BUN 13 08/19/2021   CO2 25 08/19/2021   TSH 0.78 12/12/2020   HGBA1C 6.3 08/19/2021     MM 3D SCREEN BREAST BILATERAL  Result Date: 09/10/2021 CLINICAL DATA:  Screening. EXAM: DIGITAL SCREENING BILATERAL MAMMOGRAM WITH TOMOSYNTHESIS AND CAD TECHNIQUE: Bilateral screening digital craniocaudal  and mediolateral oblique mammograms were obtained. Bilateral screening digital breast tomosynthesis was performed. The images were evaluated with computer-aided detection. COMPARISON:  Previous exam(s). ACR Breast Density Category a: The breast tissue is almost entirely fatty. FINDINGS: There are no findings suspicious for malignancy. IMPRESSION: No mammographic evidence of malignancy. A result letter of this screening mammogram will be mailed directly to the patient. RECOMMENDATION: Screening mammogram in one year. (Code:SM-B-01Y) BI-RADS CATEGORY  1: Negative. Electronically Signed   By: Ammie Ferrier M.D.   On: 09/10/2021 14:51      Assessment & Plan:   Problem List Items Addressed This Visit     Chest pain - Primary   Relevant Orders   EKG 12-Lead (Completed)   Essential hypertension, benign   Relevant Orders   Basic metabolic panel   Hypercholesterolemia   Relevant Orders   TSH   Lipid panel   Hepatic function panel   Hyperglycemia   Relevant Orders   Hemoglobin A1c     Einar Pheasant, MD

## 2021-12-29 ENCOUNTER — Telehealth: Payer: Self-pay | Admitting: Internal Medicine

## 2021-12-29 ENCOUNTER — Encounter: Payer: Self-pay | Admitting: Internal Medicine

## 2021-12-29 NOTE — Assessment & Plan Note (Signed)
Continue amlodipine, lisinopril and Aldactone.  Blood pressures doing well.  Reviewed outside blood pressure checks and these were averaging 120s over 60- 70s. Follow pressures.  Follow metabolic panel. ?

## 2021-12-29 NOTE — Assessment & Plan Note (Signed)
No upper symptoms reported.  On Protonix. 

## 2021-12-29 NOTE — Assessment & Plan Note (Signed)
Continue citalopram.  Discussed.  Overall appears to be doing relatively well.  Follow.   ?

## 2021-12-29 NOTE — Telephone Encounter (Signed)
At her visit, she had reported chest pain.  She was to let me know about cardiology appt.  Has previously seen Dr Saralyn Pilar.  Is she ok if we go ahead and schedule.   ?

## 2021-12-29 NOTE — Assessment & Plan Note (Signed)
On Lipitor.  Low-cholesterol diet and exercise.  Follow lipid panel liver function testing. ?

## 2021-12-29 NOTE — Assessment & Plan Note (Signed)
Low-carb diet and exercise.  Follow met b and A1c. ?

## 2021-12-29 NOTE — Assessment & Plan Note (Signed)
EKG - SR with no acute ischemic changes.  Discussed further cardiac w/up - given risk factors, etc.  She will call - to schedule.  Follow.  Continue risk factor modification.  ?

## 2021-12-29 NOTE — Assessment & Plan Note (Signed)
CPAP.  

## 2021-12-31 NOTE — Telephone Encounter (Signed)
Pt returning call. Pt requesting callback.  °

## 2021-12-31 NOTE — Telephone Encounter (Signed)
Called patient. She was asleep at time of call. Husband advised that he would have patient call once she wakes up. ?

## 2022-01-01 ENCOUNTER — Encounter: Payer: Self-pay | Admitting: Internal Medicine

## 2022-01-01 NOTE — Telephone Encounter (Signed)
LMTCB

## 2022-01-01 NOTE — Telephone Encounter (Signed)
Pt returning call and I read the message and pt need to talk to husband before scheduling appt ?

## 2022-01-01 NOTE — Telephone Encounter (Signed)
Would like to hold on scheduling appt for now, ?

## 2022-01-22 ENCOUNTER — Other Ambulatory Visit: Payer: Self-pay | Admitting: Internal Medicine

## 2022-02-03 ENCOUNTER — Other Ambulatory Visit: Payer: Self-pay | Admitting: Family

## 2022-02-03 ENCOUNTER — Other Ambulatory Visit (HOSPITAL_COMMUNITY): Payer: Self-pay

## 2022-02-17 ENCOUNTER — Ambulatory Visit: Payer: Managed Care, Other (non HMO) | Admitting: Internal Medicine

## 2022-02-24 ENCOUNTER — Ambulatory Visit (INDEPENDENT_AMBULATORY_CARE_PROVIDER_SITE_OTHER): Payer: Managed Care, Other (non HMO) | Admitting: Internal Medicine

## 2022-02-24 ENCOUNTER — Encounter: Payer: Self-pay | Admitting: Internal Medicine

## 2022-02-24 DIAGNOSIS — G4730 Sleep apnea, unspecified: Secondary | ICD-10-CM

## 2022-02-24 DIAGNOSIS — E7800 Pure hypercholesterolemia, unspecified: Secondary | ICD-10-CM | POA: Diagnosis not present

## 2022-02-24 DIAGNOSIS — R739 Hyperglycemia, unspecified: Secondary | ICD-10-CM

## 2022-02-24 DIAGNOSIS — I10 Essential (primary) hypertension: Secondary | ICD-10-CM

## 2022-02-24 DIAGNOSIS — K219 Gastro-esophageal reflux disease without esophagitis: Secondary | ICD-10-CM | POA: Diagnosis not present

## 2022-02-24 DIAGNOSIS — F419 Anxiety disorder, unspecified: Secondary | ICD-10-CM

## 2022-02-24 NOTE — Progress Notes (Signed)
Patient ID: KENYANNA GRZESIAK, female   DOB: December 16, 1960, 61 y.o.   MRN: [id number]   Subjective:    Patient ID: MARIEA MCMARTIN, female    DOB: 1960-11-09, 61 y.o.   MRN: [id number]   Patient here for a scheduled follow up.   Chief Complaint  Patient presents with   Follow-up    8 wk follow up    .   HPI Increased stress/anxiety. Husband just diagnosed with multiple myeloma.  Increased stress with his medical issues.  Staying active.  Is sleeping some better now.  Using cpap.  No chest pain or sob reported.  No abdominal pain.  Bowels doing ok now.  Blood pressures reviewed.  Brought in readings. Discussed her medication.  She feels doing better on the medication and desires not to change.    Past Medical History:  Diagnosis Date   Allergy    Anemia    Cancer ([place])    Depression    Diverticulosis    FHx: migraine headaches    GERD (gastroesophageal reflux disease)    History of chickenpox    Hypercholesterolemia    Hyperlipidemia    Hypertension    Osteoarthritis    Panic attacks    Sleep apnea    Past Surgical History:  Procedure Laterality Date   ABDOMINAL HYSTERECTOMY     CATARACT EXTRACTION W/PHACO Left 08/11/2017   Procedure: CATARACT EXTRACTION PHACO AND INTRAOCULAR LENS PLACEMENT ([place]);  Surgeon: Birder Robson, MD;  Location: [facility];  Service: Ophthalmology;  Laterality: Left;  [place] 00:31 AP% 19.2 CDE 6.13 Fluid Pack lot # [id number]   CATARACT EXTRACTION W/PHACO Right 09/01/2017   Procedure: CATARACT EXTRACTION PHACO AND INTRAOCULAR LENS PLACEMENT ([facility]);  Surgeon: Birder Robson, MD;  Location: [facility];  Service: Ophthalmology;  Laterality: Right;  [place] 00:30 AP% 11.7 CDE3.52 fluid pack lot #[id number] H   Child Birth  1983 and 1979   COLONOSCOPY     COLONOSCOPY WITH PROPOFOL N/A 08/25/2018   Procedure: COLONOSCOPY WITH PROPOFOL;  Surgeon: Toledo, Benay Pike, MD;  Location: [facility];  Service: Gastroenterology;  Laterality: N/A;    ESOPHAGOGASTRODUODENOSCOPY     ESOPHAGOGASTRODUODENOSCOPY (EGD) WITH PROPOFOL N/A 08/25/2018   Procedure: ESOPHAGOGASTRODUODENOSCOPY (EGD) WITH PROPOFOL;  Surgeon: Toledo, Benay Pike, MD;  Location: [facility];  Service: Gastroenterology;  Laterality: N/A;   FRACTURE SURGERY     LAPAROSCOPIC SUPRACERVICAL HYSTERECTOMY  2006   ovaries left in place   left elbow     left knee     Miscarriage  [place]   Family History  Problem Relation Age of Onset   Heart disease Mother        Incomplete heart block   Hypertension Mother    Diabetes Mother    Nephrolithiasis Mother    Migraines Mother    Arthritis Mother        degenerative-back,osteoporosis   Heart failure Mother    Heart disease Father 33       Myocardial infarction   Heart failure Father    Heart disease Sister        h/o MI   Migraines Sister    Breast cancer Neg Hx    Social History   Socioeconomic History   Marital status: Married    Spouse name: Not on file   Number of children: Not on file   Years of education: Not on file   Highest education level: Not on file  Occupational  History   Not on file  Tobacco Use   Smoking status: Former    Packs/day: 0.10    Years: 1.50    Pack years: 0.15    Types: Cigarettes   Smokeless tobacco: Never  Vaping Use   Vaping Use: Never used  Substance and Sexual Activity   Alcohol use: No   Drug use: No   Sexual activity: Not on file  Other Topics Concern   Not on file  Social History Narrative   Not on file   Social Determinants of Health   Financial Resource Strain: Not on file  Food Insecurity: Not on file  Transportation Needs: Not on file  Physical Activity: Not on file  Stress: Not on file  Social Connections: Not on file     Review of Systems  Constitutional:  Negative for appetite change and unexpected weight change.  HENT:  Negative for congestion and sinus pressure.   Respiratory:  Negative for cough,  chest tightness and shortness of breath.   Cardiovascular:  Negative for chest pain and palpitations.  Gastrointestinal:  Negative for abdominal pain, diarrhea, nausea and vomiting.  Genitourinary:  Negative for difficulty urinating and dysuria.  Musculoskeletal:  Negative for joint swelling and myalgias.  Skin:  Negative for color change and rash.  Neurological:  Negative for dizziness, light-headedness and headaches.  Psychiatric/Behavioral:  Negative for dysphoric mood.        Increased stress and anxiety as outlined.  Feels medication helping.  Desires not to change.        Objective:     BP 128/74   Pulse 70   Temp 98.5 F (36.9 C) (Temporal)   Resp 17   Ht _0  (1.702 m)   Wt 273 lb 3.2 oz (123.9 kg)   SpO2 99%   BMI 42.79 kg/m  Wt Readings from Last 3 Encounters:  02/24/22 273 lb 3.2 oz (123.9 kg)  12/19/21 268 lb (121.6 kg)  08/19/21 273 lb 6.4 oz (124 kg)    Physical Exam Vitals reviewed.  Constitutional:      General: She is not in acute distress.    Appearance: Normal appearance.  HENT:     Head: Normocephalic and atraumatic.     Right Ear: External ear normal.     Left Ear: External ear normal.  Eyes:     General: No scleral icterus.       Right eye: No discharge.        Left eye: No discharge.     Conjunctiva/sclera: Conjunctivae normal.  Neck:     Thyroid: No thyromegaly.  Cardiovascular:     Rate and Rhythm: Normal rate and regular rhythm.  Pulmonary:     Effort: No respiratory distress.     Breath sounds: Normal breath sounds. No wheezing.  Abdominal:     General: Bowel sounds are normal.     Palpations: Abdomen is soft.     Tenderness: There is no abdominal tenderness.  Musculoskeletal:        General: No swelling or tenderness.     Cervical back: Neck supple. No tenderness.  Lymphadenopathy:     Cervical: No cervical adenopathy.  Skin:    Findings: No erythema or rash.  Neurological:     Mental Status: She is alert.  Psychiatric:         Mood and Affect: Mood normal.        Behavior: Behavior normal.     Outpatient Encounter Medications as of 02/24/2022  Medication Sig   acetaminophen (TYLENOL) 650 MG CR tablet Take 650 mg by mouth every 8 (eight) hours as needed for pain.   amLODipine (NORVASC) 10 MG tablet TAKE 1 TABLET(10 MG) BY MOUTH DAILY   atorvastatin (LIPITOR) 40 MG tablet Take 1 tablet (40 mg total) by mouth daily. TAKE 1 TABLET(20 MG) BY MOUTH DAILY   Blood Glucose Monitoring Suppl (CONTOUR NEXT MONITOR) w/Device KIT 1 kit by Does not apply route 2 (two) times daily. DX: E11.9   busPIRone (BUSPAR) 5 MG tablet Take 1 tablet (5 mg total) by mouth 2 (two) times daily. TAKE 1 TABLET(5 MG) BY MOUTH DAILY AS NEEDED   citalopram (CELEXA) 40 MG tablet TAKE 1 TABLET BY MOUTH ONCE DAILY.GENERIC EQUIVALENT FOR CELEXA   CONTOUR NEXT TEST test strip USE 1 STRIP TO CHECK GLUCOSE TWICE DAILY   Microlet Lancets MISC USE TO CHECK SUGAR TWICE DAILY   ondansetron (ZOFRAN ODT) 4 MG disintegrating tablet Take 1 tablet (4 mg total) by mouth 2 (two) times daily as needed for nausea or vomiting.   ondansetron (ZOFRAN) 4 MG tablet Take 4 mg by mouth every 8 (eight) hours as needed for nausea or vomiting.   pantoprazole (PROTONIX) 40 MG tablet Take 1 tablet (40 mg total) by mouth daily.   spironolactone (ALDACTONE) 25 MG tablet Take 1 tablet (25 mg total) by mouth daily.   [DISCONTINUED] lisinopril (ZESTRIL) 40 MG tablet TAKE 1 TABLET(40 MG) BY MOUTH DAILY   No facility-administered encounter medications on file as of 02/24/2022.     Lab Results  Component Value Date   WBC 7.6 04/17/2021   HGB 14.1 04/17/2021   HCT 41.1 04/17/2021   PLT 254.0 04/17/2021   GLUCOSE 100 (H) 12/19/2021   CHOL 154 12/19/2021   TRIG 89.0 12/19/2021   HDL 60.20 12/19/2021   LDLCALC 76 12/19/2021   ALT 28 12/19/2021   AST 29 12/19/2021   NA 136 12/19/2021   K 4.3 12/19/2021   CL 99 12/19/2021   CREATININE 0.78 12/19/2021   BUN 12 12/19/2021    CO2 27 12/19/2021   TSH 1.09 12/19/2021   HGBA1C 6.0 12/19/2021    MM 3D SCREEN BREAST BILATERAL  Result Date: 09/10/2021 CLINICAL DATA:  Screening. EXAM: DIGITAL SCREENING BILATERAL MAMMOGRAM WITH TOMOSYNTHESIS AND CAD TECHNIQUE: Bilateral screening digital craniocaudal and mediolateral oblique mammograms were obtained. Bilateral screening digital breast tomosynthesis was performed. The images were evaluated with computer-aided detection. COMPARISON:  Previous exam(s). ACR Breast Density Category a: The breast tissue is almost entirely fatty. FINDINGS: There are no findings suspicious for malignancy. IMPRESSION: No mammographic evidence of malignancy. A result letter of this screening mammogram will be mailed directly to the patient. RECOMMENDATION: Screening mammogram in one year. (Code:SM-B-01Y) BI-RADS CATEGORY  1: Negative. Electronically Signed   By: Ammie Ferrier M.D.   On: 09/10/2021 14:51      Assessment & Plan:   Problem List Items Addressed This Visit     Anxiety    Increased stress and anxiety as outlined.  Discussed.  Feels medication is helping.  No changes.  Follow.        Essential hypertension, benign    Continue amlodipine, lisinopril and Aldactone.  Blood pressures doing well.  Reviewed outside blood pressure checks and these were averaging 120s over 60- 70s. Follow pressures.  Follow metabolic panel.       Relevant Orders   Basic metabolic panel   GERD (gastroesophageal reflux disease)    No upper symptoms reported.  On Protonix.       Hypercholesterolemia    On Lipitor.  Low-cholesterol diet and exercise.  Follow lipid panel liver function testing.       Relevant Orders   CBC with Differential/Platelet   Hepatic function panel   Lipid panel   Hyperglycemia    Low-carb diet and exercise.  Follow metb and A1c.       Relevant Orders   Hemoglobin A1c   Sleep apnea    Continue cpap.          Einar Pheasant, MD

## 2022-02-25 ENCOUNTER — Other Ambulatory Visit: Payer: Self-pay | Admitting: Internal Medicine

## 2022-03-08 ENCOUNTER — Encounter: Payer: Self-pay | Admitting: Internal Medicine

## 2022-03-08 NOTE — Progress Notes (Incomplete)
[name], female   DOB: Jun 26, 1961, 61 y.o.   MRN: [id number]   Subjective:    [name], female    DOB: 03-Mar-1961, 61 y.o.   MRN: [id number]   Patient here for a scheduled follow up.   Chief Complaint  Patient presents with  . Follow-up    8 wk follow up    .   HPI Increased stress/anxiety. Husband just diagnosed with multiple myeloma.  Increased stress with his medical issues.  Staying active.  Is sleeping some better now.  Using cpap.  No chest pain or sob reported.  No abdominal pain.  Bowels doing ok now.  Blood pressures reviewed.  Brought in readings. Discussed her medication.  She feels doing better on the medication and desires not to change.    Past Medical History:  Diagnosis Date  . Allergy   . Anemia   . Cancer ([place])   . Depression   . Diverticulosis   . FHx: migraine headaches   . GERD (gastroesophageal reflux disease)   . History of chickenpox   . Hypercholesterolemia   . Hyperlipidemia   . Hypertension   . Osteoarthritis   . Panic attacks   . Sleep apnea    Past Surgical History:  Procedure Laterality Date  . ABDOMINAL HYSTERECTOMY    . CATARACT EXTRACTION W/PHACO Left 08/11/2017   Procedure: CATARACT EXTRACTION PHACO AND INTRAOCULAR LENS PLACEMENT (I[facility]);  Surgeon: Birder Robson, MD;  Location: [facility];  Service: Ophthalmology;  Laterality: Left;  [place] 00:31 AP% 19.2 CDE 6.13 Fluid Pack lot # [id number]  . CATARACT EXTRACTION W/PHACO Right 09/01/2017   Procedure: CATARACT EXTRACTION PHACO AND INTRAOCULAR LENS PLACEMENT ([place]);  Surgeon: Birder Robson, MD;  Location: [facility];  Service: Ophthalmology;  Laterality: Right;  [place] 00:30 AP% 11.7 CDE3.52 fluid pack lot #[id number]  . Child Birth  15 and 29  . COLONOSCOPY    . COLONOSCOPY WITH PROPOFOL N/A 08/25/2018   Procedure: COLONOSCOPY WITH PROPOFOL;  Surgeon: Toledo, Benay Pike, MD;  Location: [facility];  Service: Gastroenterology;  Laterality: N/A;  .  ESOPHAGOGASTRODUODENOSCOPY    . ESOPHAGOGASTRODUODENOSCOPY (EGD) WITH PROPOFOL N/A 08/25/2018   Procedure: ESOPHAGOGASTRODUODENOSCOPY (EGD) WITH PROPOFOL;  Surgeon: Toledo, Benay Pike, MD;  Location: [facility];  Service: Gastroenterology;  Laterality: N/A;  . FRACTURE SURGERY    . LAPAROSCOPIC SUPRACERVICAL HYSTERECTOMY  2006   ovaries left in place  . left elbow    . left knee    . Miscarriage  1987  . TONSILLECTOMY  1963  . TUBAL LIGATION  1993   Family History  Problem Relation Age of Onset  . Heart disease Mother        Incomplete heart block  . Hypertension Mother   . Diabetes Mother   . Nephrolithiasis Mother   . Migraines Mother   . Arthritis Mother        degenerative-back,osteoporosis  . Heart failure Mother   . Heart disease Father 6       Myocardial infarction  . Heart failure Father   . Heart disease Sister        h/o MI  . Migraines Sister   . Breast cancer Neg Hx    Social History   Socioeconomic History  . Marital status: Married    Spouse name: Not on file  . Number of children: Not on file  . Years of education: Not on file  . Highest education level: Not on file  Occupational  History  . Not on file  Tobacco Use  . Smoking status: Former    Packs/day: 0.10    Years: 1.50    Pack years: 0.15    Types: Cigarettes  . Smokeless tobacco: Never  Vaping Use  . Vaping Use: Never used  Substance and Sexual Activity  . Alcohol use: No  . Drug use: No  . Sexual activity: Not on file  Other Topics Concern  . Not on file  Social History Narrative  . Not on file   Social Determinants of Health   Financial Resource Strain: Not on file  Food Insecurity: Not on file  Transportation Needs: Not on file  Physical Activity: Not on file  Stress: Not on file  Social Connections: Not on file     Review of Systems  Constitutional:  Negative for appetite change and unexpected weight change.  HENT:  Negative for congestion and sinus pressure.    Respiratory:  Negative for cough, chest tightness and shortness of breath.   Cardiovascular:  Negative for chest pain and palpitations.  Gastrointestinal:  Negative for abdominal pain, diarrhea, nausea and vomiting.  Genitourinary:  Negative for difficulty urinating and dysuria.  Musculoskeletal:  Negative for joint swelling and myalgias.  Skin:  Negative for color change and rash.  Neurological:  Negative for dizziness, light-headedness and headaches.  Psychiatric/Behavioral:  Negative for dysphoric mood.        Increased stress and anxiety as outlined.  Feels medication helping.  Desires not to change.        Objective:     BP (!) 150/86 (BP Location: Left Arm, Patient Position: Sitting, Cuff Size: Large)   Pulse 70   Temp 98.5 F (36.9 C) (Temporal)   Resp 17   Ht 5' 7"  (1.702 m)   Wt 273 lb 3.2 oz (123.9 kg)   SpO2 99%   BMI 42.79 kg/m  Wt Readings from Last 3 Encounters:  02/24/22 273 lb 3.2 oz (123.9 kg)  12/19/21 268 lb (121.6 kg)  08/19/21 273 lb 6.4 oz (124 kg)    Physical Exam Vitals reviewed.  Constitutional:      General: She is not in acute distress.    Appearance: Normal appearance.  HENT:     Head: Normocephalic and atraumatic.     Right Ear: External ear normal.     Left Ear: External ear normal.  Eyes:     General: No scleral icterus.       Right eye: No discharge.        Left eye: No discharge.     Conjunctiva/sclera: Conjunctivae normal.  Neck:     Thyroid: No thyromegaly.  Cardiovascular:     Rate and Rhythm: Normal rate and regular rhythm.  Pulmonary:     Effort: No respiratory distress.     Breath sounds: Normal breath sounds. No wheezing.  Abdominal:     General: Bowel sounds are normal.     Palpations: Abdomen is soft.     Tenderness: There is no abdominal tenderness.  Musculoskeletal:        General: No swelling or tenderness.     Cervical back: Neck supple. No tenderness.  Lymphadenopathy:     Cervical: No cervical adenopathy.   Skin:    Findings: No erythema or rash.  Neurological:     Mental Status: She is alert.  Psychiatric:        Mood and Affect: Mood normal.        Behavior: Behavior normal.  Outpatient Encounter Medications as of 02/24/2022  Medication Sig  . acetaminophen (TYLENOL) 650 MG CR tablet Take 650 mg by mouth every 8 (eight) hours as needed for pain.  Marland Kitchen amLODipine (NORVASC) 10 MG tablet TAKE 1 TABLET(10 MG) BY MOUTH DAILY  . atorvastatin (LIPITOR) 40 MG tablet Take 1 tablet (40 mg total) by mouth daily. TAKE 1 TABLET(20 MG) BY MOUTH DAILY  . Blood Glucose Monitoring Suppl (CONTOUR NEXT MONITOR) w/Device KIT 1 kit by Does not apply route 2 (two) times daily. DX: E11.9  . busPIRone (BUSPAR) 5 MG tablet Take 1 tablet (5 mg total) by mouth 2 (two) times daily. TAKE 1 TABLET(5 MG) BY MOUTH DAILY AS NEEDED  . citalopram (CELEXA) 40 MG tablet TAKE 1 TABLET BY MOUTH ONCE DAILY.GENERIC EQUIVALENT FOR CELEXA  . CONTOUR NEXT TEST test strip USE 1 STRIP TO CHECK GLUCOSE TWICE DAILY  . lisinopril (ZESTRIL) 40 MG tablet TAKE 1 TABLET(40 MG) BY MOUTH DAILY  . Microlet Lancets MISC USE TO CHECK SUGAR TWICE DAILY  . ondansetron (ZOFRAN ODT) 4 MG disintegrating tablet Take 1 tablet (4 mg total) by mouth 2 (two) times daily as needed for nausea or vomiting.  . ondansetron (ZOFRAN) 4 MG tablet Take 4 mg by mouth every 8 (eight) hours as needed for nausea or vomiting.  . pantoprazole (PROTONIX) 40 MG tablet Take 1 tablet (40 mg total) by mouth daily.  Marland Kitchen spironolactone (ALDACTONE) 25 MG tablet Take 1 tablet (25 mg total) by mouth daily.   No facility-administered encounter medications on file as of 02/24/2022.     Lab Results  Component Value Date   WBC 7.6 04/17/2021   HGB 14.1 04/17/2021   HCT 41.1 04/17/2021   PLT 254.0 04/17/2021   GLUCOSE 100 (H) 12/19/2021   CHOL 154 12/19/2021   TRIG 89.0 12/19/2021   HDL 60.20 12/19/2021   LDLCALC 76 12/19/2021   ALT 28 12/19/2021   AST 29 12/19/2021   NA  136 12/19/2021   K 4.3 12/19/2021   CL 99 12/19/2021   CREATININE 0.78 12/19/2021   BUN 12 12/19/2021   CO2 27 12/19/2021   TSH 1.09 12/19/2021   HGBA1C 6.0 12/19/2021    MM 3D SCREEN BREAST BILATERAL  Result Date: 09/10/2021 CLINICAL DATA:  Screening. EXAM: DIGITAL SCREENING BILATERAL MAMMOGRAM WITH TOMOSYNTHESIS AND CAD TECHNIQUE: Bilateral screening digital craniocaudal and mediolateral oblique mammograms were obtained. Bilateral screening digital breast tomosynthesis was performed. The images were evaluated with computer-aided detection. COMPARISON:  Previous exam(s). ACR Breast Density Category a: The breast tissue is almost entirely fatty. FINDINGS: There are no findings suspicious for malignancy. IMPRESSION: No mammographic evidence of malignancy. A result letter of this screening mammogram will be mailed directly to the patient. RECOMMENDATION: Screening mammogram in one year. (Code:SM-B-01Y) BI-RADS CATEGORY  1: Negative. Electronically Signed   By: Ammie Ferrier M.D.   On: 09/10/2021 14:51      Assessment & Plan:   Problem List Items Addressed This Visit   None    Einar Pheasant, MD

## 2022-03-09 NOTE — Assessment & Plan Note (Signed)
Low-carb diet and exercise.  Follow met b and A1c. ?

## 2022-03-09 NOTE — Assessment & Plan Note (Signed)
Continue cpap.  

## 2022-03-09 NOTE — Assessment & Plan Note (Signed)
Continue amlodipine, lisinopril and Aldactone.  Blood pressures doing well.  Reviewed outside blood pressure checks and these were averaging 120s over 60- 70s. Follow pressures.  Follow metabolic panel.

## 2022-03-09 NOTE — Assessment & Plan Note (Signed)
On Lipitor.  Low-cholesterol diet and exercise.  Follow lipid panel liver function testing.

## 2022-03-09 NOTE — Assessment & Plan Note (Signed)
No upper symptoms reported.  On Protonix. 

## 2022-03-09 NOTE — Assessment & Plan Note (Signed)
Increased stress and anxiety as outlined.  Discussed.  Feels medication is helping.  No changes.  Follow.

## 2022-03-23 ENCOUNTER — Other Ambulatory Visit: Payer: Self-pay | Admitting: Internal Medicine

## 2022-04-15 ENCOUNTER — Other Ambulatory Visit: Payer: Self-pay | Admitting: Internal Medicine

## 2022-05-01 ENCOUNTER — Other Ambulatory Visit: Payer: Self-pay | Admitting: Internal Medicine

## 2022-05-01 ENCOUNTER — Other Ambulatory Visit (INDEPENDENT_AMBULATORY_CARE_PROVIDER_SITE_OTHER): Payer: Managed Care, Other (non HMO)

## 2022-05-01 DIAGNOSIS — R739 Hyperglycemia, unspecified: Secondary | ICD-10-CM

## 2022-05-01 DIAGNOSIS — E7800 Pure hypercholesterolemia, unspecified: Secondary | ICD-10-CM

## 2022-05-01 DIAGNOSIS — I10 Essential (primary) hypertension: Secondary | ICD-10-CM | POA: Diagnosis not present

## 2022-05-01 DIAGNOSIS — D649 Anemia, unspecified: Secondary | ICD-10-CM

## 2022-05-01 LAB — CBC WITH DIFFERENTIAL/PLATELET
Basophils Absolute: 0.1 10*3/uL (ref 0.0–0.1)
Basophils Relative: 1 % (ref 0.0–3.0)
Eosinophils Absolute: 0.2 10*3/uL (ref 0.0–0.7)
Eosinophils Relative: 2.4 % (ref 0.0–5.0)
HCT: 35.4 % — ABNORMAL LOW (ref 36.0–46.0)
Hemoglobin: 11.9 g/dL — ABNORMAL LOW (ref 12.0–15.0)
Lymphocytes Relative: 51.9 % — ABNORMAL HIGH (ref 12.0–46.0)
Lymphs Abs: 3.3 10*3/uL (ref 0.7–4.0)
MCHC: 33.6 g/dL (ref 30.0–36.0)
MCV: 94.8 fl (ref 78.0–100.0)
Monocytes Absolute: 0.5 10*3/uL (ref 0.1–1.0)
Monocytes Relative: 7.4 % (ref 3.0–12.0)
Neutro Abs: 2.3 10*3/uL (ref 1.4–7.7)
Neutrophils Relative %: 37.3 % — ABNORMAL LOW (ref 43.0–77.0)
Platelets: 219 10*3/uL (ref 150.0–400.0)
RBC: 3.73 Mil/uL — ABNORMAL LOW (ref 3.87–5.11)
RDW: 14 % (ref 11.5–15.5)
WBC: 6.3 10*3/uL (ref 4.0–10.5)

## 2022-05-01 LAB — LIPID PANEL
Cholesterol: 123 mg/dL (ref 0–200)
HDL: 48.9 mg/dL (ref >39.00)
LDL Cholesterol: 50 mg/dL (ref 0–99)
NonHDL: 73.9
Total CHOL/HDL Ratio: 3
Triglycerides: 119 mg/dL (ref 0.0–149.0)
VLDL: 23.8 mg/dL (ref 0.0–40.0)

## 2022-05-01 LAB — BASIC METABOLIC PANEL
BUN: 7 mg/dL (ref 6–23)
CO2: 29 mEq/L (ref 19–32)
Calcium: 9.3 mg/dL (ref 8.4–10.5)
Chloride: 103 mEq/L (ref 96–112)
Creatinine, Ser: 0.74 mg/dL (ref 0.40–1.20)
GFR: 86.27 mL/min (ref >60.00)
Glucose, Bld: 81 mg/dL (ref 70–99)
Potassium: 4.1 mEq/L (ref 3.5–5.1)
Sodium: 142 mEq/L (ref 135–145)

## 2022-05-01 LAB — HEMOGLOBIN A1C: Hgb A1c MFr Bld: 6.1 % (ref 4.6–6.5)

## 2022-05-01 LAB — HEPATIC FUNCTION PANEL
ALT: 19 U/L (ref 0–35)
AST: 20 U/L (ref 0–37)
Albumin: 4.4 g/dL (ref 3.5–5.2)
Alkaline Phosphatase: 45 U/L (ref 39–117)
Bilirubin, Direct: 0.1 mg/dL (ref 0.0–0.3)
Total Bilirubin: 0.5 mg/dL (ref 0.2–1.2)
Total Protein: 6.8 g/dL (ref 6.0–8.3)

## 2022-05-01 NOTE — Progress Notes (Signed)
Order placed for add on labs.   °

## 2022-05-02 ENCOUNTER — Other Ambulatory Visit (INDEPENDENT_AMBULATORY_CARE_PROVIDER_SITE_OTHER): Payer: Managed Care, Other (non HMO)

## 2022-05-02 DIAGNOSIS — D649 Anemia, unspecified: Secondary | ICD-10-CM

## 2022-05-02 LAB — IBC + FERRITIN
Ferritin: 51.6 ng/mL (ref 10.0–291.0)
Iron: 124 ug/dL (ref 42–145)
Saturation Ratios: 37.7 % (ref 20.0–50.0)
TIBC: 329 ug/dL (ref 250.0–450.0)
Transferrin: 235 mg/dL (ref 212.0–360.0)

## 2022-05-02 LAB — VITAMIN B12: Vitamin B-12: 211 pg/mL (ref 211–911)

## 2022-05-07 ENCOUNTER — Encounter: Payer: Self-pay | Admitting: Internal Medicine

## 2022-05-07 ENCOUNTER — Ambulatory Visit (INDEPENDENT_AMBULATORY_CARE_PROVIDER_SITE_OTHER): Payer: Managed Care, Other (non HMO) | Admitting: Internal Medicine

## 2022-05-07 VITALS — BP 132/68 | HR 72 | Temp 98.4°F | Resp 16 | Ht 67.0 in | Wt 274.0 lb

## 2022-05-07 DIAGNOSIS — F419 Anxiety disorder, unspecified: Secondary | ICD-10-CM

## 2022-05-07 DIAGNOSIS — I10 Essential (primary) hypertension: Secondary | ICD-10-CM

## 2022-05-07 DIAGNOSIS — G4730 Sleep apnea, unspecified: Secondary | ICD-10-CM

## 2022-05-07 DIAGNOSIS — K219 Gastro-esophageal reflux disease without esophagitis: Secondary | ICD-10-CM

## 2022-05-07 DIAGNOSIS — Z0000 Encounter for general adult medical examination without abnormal findings: Secondary | ICD-10-CM | POA: Diagnosis not present

## 2022-05-07 DIAGNOSIS — R739 Hyperglycemia, unspecified: Secondary | ICD-10-CM

## 2022-05-07 DIAGNOSIS — D649 Anemia, unspecified: Secondary | ICD-10-CM

## 2022-05-07 DIAGNOSIS — Z1231 Encounter for screening mammogram for malignant neoplasm of breast: Secondary | ICD-10-CM

## 2022-05-07 DIAGNOSIS — E7800 Pure hypercholesterolemia, unspecified: Secondary | ICD-10-CM

## 2022-05-07 NOTE — Progress Notes (Signed)
[name], female   DOB: 10-Feb-1961, 61 y.o.   MRN: [id number]   Subjective:    [name], female    DOB: Nov 26, 1960, 62 y.o.   MRN: [id number]   Patient here for her physical exam.    Chief Complaint  Patient presents with   Annual Exam   .   HPI Increased stress.  Stress with her husband's health issues.  Discussed.  Overall she feels she is handling things relatively well.  Feels medication working well for her.  Does not feel needs any further intervention.  No chest pain or sob reported.  No abdominal pain.  Bowels stable.  Discussed recent labs.     Past Medical History:  Diagnosis Date   Allergy    Anemia    Cancer ([place])    Depression    Diverticulosis    FHx: migraine headaches    GERD (gastroesophageal reflux disease)    History of chickenpox    Hypercholesterolemia    Hyperlipidemia    Hypertension    Osteoarthritis    Panic attacks    Sleep apnea    Past Surgical History:  Procedure Laterality Date   ABDOMINAL HYSTERECTOMY     CATARACT EXTRACTION W/PHACO Left 08/11/2017   Procedure: CATARACT EXTRACTION PHACO AND INTRAOCULAR LENS PLACEMENT ([employer]);  Surgeon: Birder Robson, MD;  Location: [facility];  Service: Ophthalmology;  Laterality: Left;  [place] 00:31 AP% 19.2 CDE 6.13 Fluid Pack lot # [id number]   CATARACT EXTRACTION W/PHACO Right 09/01/2017   Procedure: CATARACT EXTRACTION PHACO AND INTRAOCULAR LENS PLACEMENT ([facility]);  Surgeon: Birder Robson, MD;  Location: [facility];  Service: Ophthalmology;  Laterality: Right;  [place] 00:30 AP% 11.7 CDE3.52 fluid pack lot #[id number]   Child Birth  1983 and 1979   COLONOSCOPY     COLONOSCOPY WITH PROPOFOL N/A 08/25/2018   Procedure: COLONOSCOPY WITH PROPOFOL;  Surgeon: Toledo, Benay Pike, MD;  Location: [facility];  Service: Gastroenterology;  Laterality: N/A;   ESOPHAGOGASTRODUODENOSCOPY     ESOPHAGOGASTRODUODENOSCOPY (EGD) WITH PROPOFOL N/A 08/25/2018   Procedure:  ESOPHAGOGASTRODUODENOSCOPY (EGD) WITH PROPOFOL;  Surgeon: Toledo, Benay Pike, MD;  Location: [facility];  Service: Gastroenterology;  Laterality: N/A;   FRACTURE SURGERY     LAPAROSCOPIC SUPRACERVICAL HYSTERECTOMY  2006   ovaries left in place   left elbow     left knee     Miscarriage  [place]   Family History  Problem Relation Age of Onset   Heart disease Mother        Incomplete heart block   Hypertension Mother    Diabetes Mother    Nephrolithiasis Mother    Migraines Mother    Arthritis Mother        degenerative-back,osteoporosis   Heart failure Mother    Heart disease Father 57       Myocardial infarction   Heart failure Father    Heart disease Sister        h/o MI   Migraines Sister    Breast cancer Neg Hx    Social History   Socioeconomic History   Marital status: Married    Spouse name: Not on file   Number of children: Not on file   Years of education: Not on file   Highest education level: Not on file  Occupational History   Not on file  Tobacco Use   Smoking status: Former    Packs/day: 0.10  Years: 1.50    Total pack years: 0.15    Types: Cigarettes   Smokeless tobacco: Never  Vaping Use   Vaping Use: Never used  Substance and Sexual Activity   Alcohol use: No   Drug use: No   Sexual activity: Not on file  Other Topics Concern   Not on file  Social History Narrative   Not on file   Social Determinants of Health   Financial Resource Strain: Not on file  Food Insecurity: Not on file  Transportation Needs: Not on file  Physical Activity: Not on file  Stress: Not on file  Social Connections: Not on file     Review of Systems  Constitutional:  Negative for appetite change and unexpected weight change.  HENT:  Negative for congestion, sinus pressure and sore throat.   Eyes:  Negative for pain and visual disturbance.  Respiratory:  Negative for cough, chest tightness and shortness of breath.    Cardiovascular:  Negative for chest pain, palpitations and leg swelling.  Gastrointestinal:  Negative for abdominal pain, diarrhea, nausea and vomiting.  Genitourinary:  Negative for difficulty urinating and dysuria.  Musculoskeletal:  Negative for joint swelling and myalgias.  Skin:  Negative for color change and rash.  Neurological:  Negative for dizziness, light-headedness and headaches.  Hematological:  Negative for adenopathy. Does not bruise/bleed easily.  Psychiatric/Behavioral:  Negative for agitation and dysphoric mood.        Increased stress as outlined.         Objective:     BP 132/68 (BP Location: Left Arm, Patient Position: Sitting, Cuff Size: Large)   Pulse 72   Temp 98.4 F (36.9 C) (Temporal)   Resp 16   Ht 5' 7"  (1.702 m)   Wt 274 lb (124.3 kg)   SpO2 97%   BMI 42.91 kg/m  Wt Readings from Last 3 Encounters:  05/07/22 274 lb (124.3 kg)  02/24/22 273 lb 3.2 oz (123.9 kg)  12/19/21 268 lb (121.6 kg)    Physical Exam Vitals reviewed.  Constitutional:      General: She is not in acute distress.    Appearance: Normal appearance. She is well-developed.  HENT:     Head: Normocephalic and atraumatic.     Right Ear: External ear normal.     Left Ear: External ear normal.  Eyes:     General: No scleral icterus.       Right eye: No discharge.        Left eye: No discharge.     Conjunctiva/sclera: Conjunctivae normal.  Neck:     Thyroid: No thyromegaly.  Cardiovascular:     Rate and Rhythm: Normal rate and regular rhythm.  Pulmonary:     Effort: No tachypnea, accessory muscle usage or respiratory distress.     Breath sounds: Normal breath sounds. No decreased breath sounds or wheezing.  Chest:  Breasts:    Right: No inverted nipple, mass, nipple discharge or tenderness (no axillary adenopathy).     Left: No inverted nipple, mass, nipple discharge or tenderness (no axilarry adenopathy).  Abdominal:     General: Bowel sounds are normal.     Palpations:  Abdomen is soft.     Tenderness: There is no abdominal tenderness.  Musculoskeletal:        General: No swelling or tenderness.     Cervical back: Neck supple.  Lymphadenopathy:     Cervical: No cervical adenopathy.  Skin:    Findings: No erythema or rash.  Neurological:     Mental Status: She is alert and oriented to person, place, and time.  Psychiatric:        Mood and Affect: Mood normal.        Behavior: Behavior normal.      Outpatient Encounter Medications as of 05/07/2022  Medication Sig   acetaminophen (TYLENOL) 650 MG CR tablet Take 650 mg by mouth every 8 (eight) hours as needed for pain.   amLODipine (NORVASC) 10 MG tablet Take 1 tablet by mouth once daily   atorvastatin (LIPITOR) 40 MG tablet Take 1 tablet (40 mg total) by mouth daily. TAKE 1 TABLET(20 MG) BY MOUTH DAILY   Blood Glucose Monitoring Suppl (CONTOUR NEXT MONITOR) w/Device KIT 1 kit by Does not apply route 2 (two) times daily. DX: E11.9   busPIRone (BUSPAR) 5 MG tablet Take 1 tablet (5 mg total) by mouth 2 (two) times daily. TAKE 1 TABLET(5 MG) BY MOUTH DAILY AS NEEDED   citalopram (CELEXA) 40 MG tablet Take 1 tablet by mouth once daily   Microlet Lancets MISC USE TO CHECK SUGAR TWICE DAILY   ondansetron (ZOFRAN ODT) 4 MG disintegrating tablet Take 1 tablet (4 mg total) by mouth 2 (two) times daily as needed for nausea or vomiting.   ondansetron (ZOFRAN) 4 MG tablet Take 4 mg by mouth every 8 (eight) hours as needed for nausea or vomiting.   pantoprazole (PROTONIX) 40 MG tablet Take 1 tablet (40 mg total) by mouth daily.   spironolactone (ALDACTONE) 25 MG tablet Take 1 tablet (25 mg total) by mouth daily.   [DISCONTINUED] glucose blood (CONTOUR NEXT TEST) test strip USE TO CHECK BLOOD SUGAR TWICE DAILY   [DISCONTINUED] lisinopril (ZESTRIL) 40 MG tablet Take 1 tablet by mouth once daily   No facility-administered encounter medications on file as of 05/07/2022.     Lab Results  Component Value Date   WBC  6.3 05/01/2022   HGB 11.9 (L) 05/01/2022   HCT 35.4 (L) 05/01/2022   PLT 219.0 05/01/2022   GLUCOSE 81 05/01/2022   CHOL 123 05/01/2022   TRIG 119.0 05/01/2022   HDL 48.90 05/01/2022   LDLCALC 50 05/01/2022   ALT 19 05/01/2022   AST 20 05/01/2022   NA 142 05/01/2022   K 4.1 05/01/2022   CL 103 05/01/2022   CREATININE 0.74 05/01/2022   BUN 7 05/01/2022   CO2 29 05/01/2022   TSH 1.09 12/19/2021   HGBA1C 6.1 05/01/2022    MM 3D SCREEN BREAST BILATERAL  Result Date: 09/10/2021 CLINICAL DATA:  Screening. EXAM: DIGITAL SCREENING BILATERAL MAMMOGRAM WITH TOMOSYNTHESIS AND CAD TECHNIQUE: Bilateral screening digital craniocaudal and mediolateral oblique mammograms were obtained. Bilateral screening digital breast tomosynthesis was performed. The images were evaluated with computer-aided detection. COMPARISON:  Previous exam(s). ACR Breast Density Category a: The breast tissue is almost entirely fatty. FINDINGS: There are no findings suspicious for malignancy. IMPRESSION: No mammographic evidence of malignancy. A result letter of this screening mammogram will be mailed directly to the patient. RECOMMENDATION: Screening mammogram in one year. (Code:SM-B-01Y) BI-RADS CATEGORY  1: Negative. Electronically Signed   By: Ammie Ferrier M.D.   On: 09/10/2021 14:51      Assessment & Plan:   Problem List Items Addressed This Visit     Anemia    Hgb decreased on recent check.  Iron studies ok.  B12 level 211.  Discussed B12 supplements.        Relevant Orders   CBC with Differential/Platelet   Anxiety  Increased stress as outlined.  Discussed.  Feels medication is helping.  No changes.  Follow.       Essential hypertension, benign    Continue amlodipine, lisinopril and Aldactone.  Blood pressure as outlined. Follow pressures.  Follow metabolic panel.      GERD (gastroesophageal reflux disease)    No upper symptoms reported.  On Protonix.      Healthcare maintenance    Physical  today 05/07/22.  Mammogram 09/10/21 - Birads I.  Colonoscopy 08/2018 - internal hemorrhoids.  F/u in 10 years.        Hypercholesterolemia    On Lipitor.  Low-cholesterol diet and exercise.  Follow lipid panel liver function tests.   Lab Results  Component Value Date   CHOL 123 05/01/2022   HDL 48.90 05/01/2022   LDLCALC 50 05/01/2022   TRIG 119.0 05/01/2022   CHOLHDL 3 05/01/2022       Hyperglycemia    Low-carb diet and exercise.  Follow metb and A1c.  Lab Results  Component Value Date   HGBA1C 6.1 05/01/2022       Sleep apnea    CPAP.       Other Visit Diagnoses     Routine general medical examination at a health care facility    -  Primary   Encounter for screening mammogram for malignant neoplasm of breast       Relevant Orders   MM 3D SCREEN BREAST BILATERAL        Einar Pheasant, MD

## 2022-05-07 NOTE — Assessment & Plan Note (Signed)
Physical today 05/07/22.  Mammogram 09/10/21 - Birads I.  Colonoscopy 08/2018 - internal hemorrhoids.  F/u in 10 years.

## 2022-05-09 ENCOUNTER — Other Ambulatory Visit: Payer: Self-pay | Admitting: Internal Medicine

## 2022-05-14 ENCOUNTER — Other Ambulatory Visit: Payer: Self-pay | Admitting: Internal Medicine

## 2022-05-18 ENCOUNTER — Telehealth: Payer: Self-pay | Admitting: Internal Medicine

## 2022-05-18 DIAGNOSIS — D649 Anemia, unspecified: Secondary | ICD-10-CM | POA: Insufficient documentation

## 2022-05-18 NOTE — Assessment & Plan Note (Signed)
CPAP.  

## 2022-05-18 NOTE — Assessment & Plan Note (Signed)
Low-carb diet and exercise.  Follow metb and A1c.  Lab Results  Component Value Date   HGBA1C 6.1 05/01/2022

## 2022-05-18 NOTE — Assessment & Plan Note (Signed)
On Lipitor.  Low-cholesterol diet and exercise.  Follow lipid panel liver function tests.   Lab Results  Component Value Date   CHOL 123 05/01/2022   HDL 48.90 05/01/2022   LDLCALC 50 05/01/2022   TRIG 119.0 05/01/2022   CHOLHDL 3 05/01/2022

## 2022-05-18 NOTE — Assessment & Plan Note (Signed)
Continue amlodipine, lisinopril and Aldactone.  Blood pressure as outlined. Follow pressures.  Follow metabolic panel.

## 2022-05-18 NOTE — Telephone Encounter (Signed)
See me before calling pt.  Need to confirm if she is taking an B12 now.  Recent level wnl, but low end of normal.  Just need to clarify if taking and how much/when started?

## 2022-05-18 NOTE — Assessment & Plan Note (Signed)
No upper symptoms reported.  On Protonix. 

## 2022-05-18 NOTE — Assessment & Plan Note (Signed)
Increased stress as outlined.  Discussed.  Feels medication is helping.  No changes.  Follow.

## 2022-05-18 NOTE — Assessment & Plan Note (Signed)
Hgb decreased on recent check.  Iron studies ok.  B12 level 211.  Discussed B12 supplements.

## 2022-05-19 NOTE — Telephone Encounter (Signed)
LMTCB

## 2022-05-20 NOTE — Telephone Encounter (Signed)
Lm for pt / pt husband to cb

## 2022-05-21 NOTE — Telephone Encounter (Signed)
See previous message.  Would recommend stopping this and starting injections as outlined.

## 2022-05-21 NOTE — Telephone Encounter (Signed)
Patient returned call and wanted the provider to know that she is taking her B12 pills 5034mg once in the morning daily.

## 2022-05-21 NOTE — Telephone Encounter (Signed)
Given her B12 level, I would like for her to start B12 injections - 1069mg q week x 4 weeks and then 10061m q month.

## 2022-05-21 NOTE — Telephone Encounter (Signed)
S/w pt husband - knows pt is taking 1 cap daily but not sure of mg's. Stated doesn't know where pills are. Not sure when pt started taking - thinks she started the day after her last appointment but not sure.  Asked John to have pt call us when she can, she was sleeping at this time. John stated he has asked her multiple times to call, but pt keeps putting it off. Stated he will try again.  Will hold in case pt doesn't call.

## 2022-05-21 NOTE — Telephone Encounter (Signed)
Pt advised - scheduled for 1st month of b12 inj and lab appt for level

## 2022-05-23 ENCOUNTER — Ambulatory Visit (INDEPENDENT_AMBULATORY_CARE_PROVIDER_SITE_OTHER): Payer: Managed Care, Other (non HMO)

## 2022-05-23 DIAGNOSIS — E538 Deficiency of other specified B group vitamins: Secondary | ICD-10-CM

## 2022-05-23 MED ORDER — CYANOCOBALAMIN 1000 MCG/ML IJ SOLN
1000.0000 ug | Freq: Once | INTRAMUSCULAR | Status: AC
  Administered 2022-05-23: 1000 ug via INTRAMUSCULAR

## 2022-05-23 NOTE — Progress Notes (Signed)
Patient presented for B 12 injection to right deltoid, patient voiced no concerns nor showed any signs of distress during injection. 

## 2022-05-30 ENCOUNTER — Ambulatory Visit: Payer: Managed Care, Other (non HMO)

## 2022-05-30 ENCOUNTER — Ambulatory Visit (INDEPENDENT_AMBULATORY_CARE_PROVIDER_SITE_OTHER): Payer: Managed Care, Other (non HMO)

## 2022-05-30 DIAGNOSIS — E538 Deficiency of other specified B group vitamins: Secondary | ICD-10-CM | POA: Diagnosis not present

## 2022-05-30 MED ORDER — CYANOCOBALAMIN 1000 MCG/ML IJ SOLN
1000.0000 ug | Freq: Once | INTRAMUSCULAR | Status: AC
  Administered 2022-05-30: 1000 ug via INTRAMUSCULAR

## 2022-05-30 NOTE — Progress Notes (Signed)
Patient presented for B 12 injection to right deltoid, patient voiced no concerns nor showed any signs of distress during injection. 

## 2022-06-06 ENCOUNTER — Ambulatory Visit (INDEPENDENT_AMBULATORY_CARE_PROVIDER_SITE_OTHER): Payer: Managed Care, Other (non HMO)

## 2022-06-06 ENCOUNTER — Ambulatory Visit: Payer: Managed Care, Other (non HMO)

## 2022-06-06 DIAGNOSIS — E538 Deficiency of other specified B group vitamins: Secondary | ICD-10-CM | POA: Diagnosis not present

## 2022-06-06 MED ORDER — CYANOCOBALAMIN 1000 MCG/ML IJ SOLN: 1000.0000 ug | Freq: Once | INTRAMUSCULAR | Status: DC

## 2022-06-06 MED ORDER — CYANOCOBALAMIN 1000 MCG/ML IJ SOLN
1000.0000 ug | Freq: Once | INTRAMUSCULAR | Status: AC
  Administered 2022-06-06: 1000 ug via INTRAMUSCULAR

## 2022-06-06 NOTE — Progress Notes (Signed)
Patient presented for B 12 injection to right deltoid, patient voiced no concerns nor showed any signs of distress during injection. 

## 2022-06-10 ENCOUNTER — Other Ambulatory Visit: Payer: Self-pay | Admitting: Internal Medicine

## 2022-06-11 ENCOUNTER — Other Ambulatory Visit: Payer: Managed Care, Other (non HMO)

## 2022-06-13 ENCOUNTER — Other Ambulatory Visit: Payer: Self-pay

## 2022-06-13 ENCOUNTER — Other Ambulatory Visit (INDEPENDENT_AMBULATORY_CARE_PROVIDER_SITE_OTHER): Payer: Managed Care, Other (non HMO)

## 2022-06-13 ENCOUNTER — Telehealth: Payer: Self-pay | Admitting: Internal Medicine

## 2022-06-13 ENCOUNTER — Ambulatory Visit (INDEPENDENT_AMBULATORY_CARE_PROVIDER_SITE_OTHER): Payer: Managed Care, Other (non HMO) | Admitting: *Deleted

## 2022-06-13 DIAGNOSIS — E538 Deficiency of other specified B group vitamins: Secondary | ICD-10-CM | POA: Diagnosis not present

## 2022-06-13 DIAGNOSIS — D649 Anemia, unspecified: Secondary | ICD-10-CM | POA: Diagnosis not present

## 2022-06-13 LAB — CBC WITH DIFFERENTIAL/PLATELET
Basophils Absolute: 0.1 10*3/uL (ref 0.0–0.1)
Basophils Relative: 0.9 % (ref 0.0–3.0)
Eosinophils Absolute: 0.1 10*3/uL (ref 0.0–0.7)
Eosinophils Relative: 1.4 % (ref 0.0–5.0)
HCT: 40.3 % (ref 36.0–46.0)
Hemoglobin: 13.6 g/dL (ref 12.0–15.0)
Lymphocytes Relative: 42.5 % (ref 12.0–46.0)
Lymphs Abs: 3.6 10*3/uL (ref 0.7–4.0)
MCHC: 33.7 g/dL (ref 30.0–36.0)
MCV: 94.4 fl (ref 78.0–100.0)
Monocytes Absolute: 0.7 10*3/uL (ref 0.1–1.0)
Monocytes Relative: 7.9 % (ref 3.0–12.0)
Neutro Abs: 4 10*3/uL (ref 1.4–7.7)
Neutrophils Relative %: 47.3 % (ref 43.0–77.0)
Platelets: 218 10*3/uL (ref 150.0–400.0)
RBC: 4.27 Mil/uL (ref 3.87–5.11)
RDW: 13.4 % (ref 11.5–15.5)
WBC: 8.4 10*3/uL (ref 4.0–10.5)

## 2022-06-13 MED ORDER — "BD DISP NEEDLE 25G X 1"" MISC": 0 refills | Status: DC

## 2022-06-13 MED ORDER — CYANOCOBALAMIN 1000 MCG/ML IJ SOLN
1000.0000 ug | Freq: Once | INTRAMUSCULAR | Status: AC
  Administered 2022-06-13: 1000 ug via INTRAMUSCULAR

## 2022-06-13 MED ORDER — "BD INTEGRA SYRINGE 25G X 5/8"" 3 ML MISC": 0 refills | Status: DC

## 2022-06-13 MED ORDER — CYANOCOBALAMIN 1000 MCG/ML IJ SOLN: 1000.0000 ug | INTRAMUSCULAR | 0 refills | Status: DC

## 2022-06-13 NOTE — Telephone Encounter (Signed)
I have sent to [facility].

## 2022-06-13 NOTE — Progress Notes (Signed)
Pt received B12 injection in left arm. Pt tolerated it well with no complaints or concerns.   This concludes her weekly dose of B12 injection. Pt would like a Rx sent to pharmacy for a 90 day supply. She has a nurse neighbor that will give them to her. Medication has been sent it.

## 2022-06-13 NOTE — Telephone Encounter (Signed)
Patient was in office today and got her B12 injection. She would like to start giving her own b12 at home. Please send a prescription for next months b12 to her pharmacy. [facility] [address]. Patient is requesting a 69msupply.

## 2022-06-18 ENCOUNTER — Encounter: Payer: Self-pay | Admitting: Internal Medicine

## 2022-07-20 ENCOUNTER — Other Ambulatory Visit: Payer: Self-pay | Admitting: Internal Medicine

## 2022-08-29 ENCOUNTER — Other Ambulatory Visit: Payer: Self-pay

## 2022-08-29 ENCOUNTER — Telehealth: Payer: Self-pay | Admitting: Internal Medicine

## 2022-08-29 DIAGNOSIS — R739 Hyperglycemia, unspecified: Secondary | ICD-10-CM

## 2022-08-29 DIAGNOSIS — E7800 Pure hypercholesterolemia, unspecified: Secondary | ICD-10-CM

## 2022-08-29 DIAGNOSIS — D649 Anemia, unspecified: Secondary | ICD-10-CM

## 2022-08-29 DIAGNOSIS — I10 Essential (primary) hypertension: Secondary | ICD-10-CM

## 2022-08-29 NOTE — Telephone Encounter (Signed)
Orders placed.

## 2022-08-29 NOTE — Telephone Encounter (Signed)
Patient has a lab appt 09/03/2022, there are no orders in.

## 2022-09-03 ENCOUNTER — Other Ambulatory Visit (INDEPENDENT_AMBULATORY_CARE_PROVIDER_SITE_OTHER): Payer: Managed Care, Other (non HMO)

## 2022-09-03 DIAGNOSIS — D649 Anemia, unspecified: Secondary | ICD-10-CM | POA: Diagnosis not present

## 2022-09-03 DIAGNOSIS — I10 Essential (primary) hypertension: Secondary | ICD-10-CM | POA: Diagnosis not present

## 2022-09-03 DIAGNOSIS — E7800 Pure hypercholesterolemia, unspecified: Secondary | ICD-10-CM

## 2022-09-03 DIAGNOSIS — R739 Hyperglycemia, unspecified: Secondary | ICD-10-CM

## 2022-09-03 LAB — CBC WITH DIFFERENTIAL/PLATELET
Basophils Absolute: 0.1 10*3/uL (ref 0.0–0.1)
Basophils Relative: 1 % (ref 0.0–3.0)
Eosinophils Absolute: 0.2 10*3/uL (ref 0.0–0.7)
Eosinophils Relative: 2 % (ref 0.0–5.0)
HCT: 41.1 % (ref 36.0–46.0)
Hemoglobin: 13.8 g/dL (ref 12.0–15.0)
Lymphocytes Relative: 39.7 % (ref 12.0–46.0)
Lymphs Abs: 3.4 10*3/uL (ref 0.7–4.0)
MCHC: 33.7 g/dL (ref 30.0–36.0)
MCV: 93.7 fl (ref 78.0–100.0)
Monocytes Absolute: 0.7 10*3/uL (ref 0.1–1.0)
Monocytes Relative: 7.7 % (ref 3.0–12.0)
Neutro Abs: 4.3 10*3/uL (ref 1.4–7.7)
Neutrophils Relative %: 49.6 % (ref 43.0–77.0)
Platelets: 278 10*3/uL (ref 150.0–400.0)
RBC: 4.38 Mil/uL (ref 3.87–5.11)
RDW: 13.5 % (ref 11.5–15.5)
WBC: 8.6 10*3/uL (ref 4.0–10.5)

## 2022-09-03 LAB — BASIC METABOLIC PANEL
BUN: 10 mg/dL (ref 6–23)
CO2: 27 mEq/L (ref 19–32)
Calcium: 9.6 mg/dL (ref 8.4–10.5)
Chloride: 101 mEq/L (ref 96–112)
Creatinine, Ser: 0.78 mg/dL (ref 0.40–1.20)
GFR: 80.8 mL/min (ref >60.00)
Glucose, Bld: 111 mg/dL — ABNORMAL HIGH (ref 70–99)
Potassium: 5.1 mEq/L (ref 3.5–5.1)
Sodium: 136 mEq/L (ref 135–145)

## 2022-09-03 LAB — HEPATIC FUNCTION PANEL
ALT: 28 U/L (ref 0–35)
AST: 25 U/L (ref 0–37)
Albumin: 4.7 g/dL (ref 3.5–5.2)
Alkaline Phosphatase: 56 U/L (ref 39–117)
Bilirubin, Direct: 0.1 mg/dL (ref 0.0–0.3)
Total Bilirubin: 0.5 mg/dL (ref 0.2–1.2)
Total Protein: 7.4 g/dL (ref 6.0–8.3)

## 2022-09-03 LAB — LIPID PANEL
Cholesterol: 142 mg/dL (ref 0–200)
HDL: 54.5 mg/dL (ref >39.00)
LDL Cholesterol: 66 mg/dL (ref 0–99)
NonHDL: 87.14
Total CHOL/HDL Ratio: 3
Triglycerides: 105 mg/dL (ref 0.0–149.0)
VLDL: 21 mg/dL (ref 0.0–40.0)

## 2022-09-03 LAB — HEMOGLOBIN A1C: Hgb A1c MFr Bld: 6.4 % (ref 4.6–6.5)

## 2022-09-08 ENCOUNTER — Other Ambulatory Visit: Payer: Self-pay | Admitting: Internal Medicine

## 2022-09-08 ENCOUNTER — Ambulatory Visit (INDEPENDENT_AMBULATORY_CARE_PROVIDER_SITE_OTHER): Payer: Managed Care, Other (non HMO)

## 2022-09-08 ENCOUNTER — Encounter: Payer: Self-pay | Admitting: Internal Medicine

## 2022-09-08 ENCOUNTER — Ambulatory Visit: Payer: Managed Care, Other (non HMO) | Admitting: Internal Medicine

## 2022-09-08 VITALS — BP 132/76 | HR 104 | Temp 98.0°F | Resp 17 | Ht 67.0 in | Wt 191.8 lb

## 2022-09-08 DIAGNOSIS — W19XXXA Unspecified fall, initial encounter: Secondary | ICD-10-CM

## 2022-09-08 DIAGNOSIS — E875 Hyperkalemia: Secondary | ICD-10-CM

## 2022-09-08 DIAGNOSIS — R059 Cough, unspecified: Secondary | ICD-10-CM | POA: Diagnosis not present

## 2022-09-08 DIAGNOSIS — R739 Hyperglycemia, unspecified: Secondary | ICD-10-CM

## 2022-09-08 DIAGNOSIS — F419 Anxiety disorder, unspecified: Secondary | ICD-10-CM

## 2022-09-08 DIAGNOSIS — E538 Deficiency of other specified B group vitamins: Secondary | ICD-10-CM

## 2022-09-08 DIAGNOSIS — I10 Essential (primary) hypertension: Secondary | ICD-10-CM | POA: Diagnosis not present

## 2022-09-08 DIAGNOSIS — R079 Chest pain, unspecified: Secondary | ICD-10-CM

## 2022-09-08 DIAGNOSIS — E7800 Pure hypercholesterolemia, unspecified: Secondary | ICD-10-CM | POA: Diagnosis not present

## 2022-09-08 DIAGNOSIS — K219 Gastro-esophageal reflux disease without esophagitis: Secondary | ICD-10-CM

## 2022-09-08 DIAGNOSIS — D649 Anemia, unspecified: Secondary | ICD-10-CM

## 2022-09-08 DIAGNOSIS — G4730 Sleep apnea, unspecified: Secondary | ICD-10-CM

## 2022-09-08 LAB — POTASSIUM: Potassium: 4.7 mEq/L (ref 3.5–5.1)

## 2022-09-08 MED ORDER — BUSPIRONE HCL 5 MG PO TABS: 5.0000 mg | ORAL_TABLET | Freq: Two times a day (BID) | ORAL | 1 refills | Status: DC

## 2022-09-08 MED ORDER — AMLODIPINE BESYLATE 10 MG PO TABS: 10.0000 mg | ORAL_TABLET | Freq: Every day | ORAL | 1 refills | Status: DC

## 2022-09-08 MED ORDER — LISINOPRIL 40 MG PO TABS: 40.0000 mg | ORAL_TABLET | Freq: Every day | ORAL | 1 refills | Status: DC

## 2022-09-08 MED ORDER — CYANOCOBALAMIN 1000 MCG/ML IJ SOLN: 1000.0000 ug | INTRAMUSCULAR | 0 refills | Status: DC

## 2022-09-08 MED ORDER — ATORVASTATIN CALCIUM 40 MG PO TABS: 40.0000 mg | ORAL_TABLET | Freq: Every day | ORAL | 1 refills | Status: DC

## 2022-09-08 NOTE — Assessment & Plan Note (Signed)
Low-carb diet and exercise.  Follow metb and A1c.  Lab Results  Component Value Date   HGBA1C 6.4 09/03/2022

## 2022-09-08 NOTE — Progress Notes (Signed)
[name], female   DOB: [birth year], 61 y.o.   MRN: [id number]   Subjective:    [name], female    DOB: [birth year], 61 y.o.   MRN: [id number]   Patient here for  Chief Complaint  Patient presents with   Follow-up   Hypertension   .   HPI Here to follow up regarding increased stress, hypertension and hypercholesterolemia.  Increased stress related to family medical issues.  Overall she feels she is doing relatively well and handling things relatively well.  Did have a fall recently.  Fell down her stairs. No head injury.  Landed on her right side, but reports no residual problems from the fall.  No pain now.  Blood pressures have been doing well.  Using CPAP regularly.  Still has some interrupted sleep due to her husband's medical issues.  Reports increased fatigue.  Also reports noticing sob with exertion.  Some intermittent chest pain.  Also has noticed with increased coughing, increased laughing, cold air or when leans over to help husband with shoes and socks - will have to hold on to something and sit down.  States everthing "goes dark" for a brief period.  No actual syncope.  Does report persistent intermittent cough.  Allergy symptoms controlled with otc medication.  No vomiting or diarrhea.  No abdominal pain.     Past Medical History:  Diagnosis Date   Allergy    Anemia    Cancer ([place])    Depression    Diverticulosis    FHx: migraine headaches    GERD (gastroesophageal reflux disease)    History of chickenpox    Hypercholesterolemia    Hyperlipidemia    Hypertension    Osteoarthritis    Panic attacks    Sleep apnea    Past Surgical History:  Procedure Laterality Date   ABDOMINAL HYSTERECTOMY     CATARACT EXTRACTION W/PHACO Left 08/11/2017   Procedure: CATARACT EXTRACTION PHACO AND INTRAOCULAR LENS PLACEMENT ([place]);  Surgeon: Birder Robson, MD;  Location: [facility];  Service: Ophthalmology;  Laterality: Left;  [place] 00:31 AP% 19.2 CDE  6.13 Fluid Pack lot # [id number]   CATARACT EXTRACTION W/PHACO Right 09/01/2017   Procedure: CATARACT EXTRACTION PHACO AND INTRAOCULAR LENS PLACEMENT ([facility]);  Surgeon: Birder Robson, MD;  Location: [facility];  Service: Ophthalmology;  Laterality: Right;  [place] 00:30 AP% 11.7 CDE3.52 fluid pack lot #[id number]   Child Birth  1983 and 1981   COLONOSCOPY     COLONOSCOPY WITH PROPOFOL N/A 08/25/2018   Procedure: COLONOSCOPY WITH PROPOFOL;  Surgeon: Toledo, Benay Pike, MD;  Location: [facility];  Service: Gastroenterology;  Laterality: N/A;   ESOPHAGOGASTRODUODENOSCOPY     ESOPHAGOGASTRODUODENOSCOPY (EGD) WITH PROPOFOL N/A 08/25/2018   Procedure: ESOPHAGOGASTRODUODENOSCOPY (EGD) WITH PROPOFOL;  Surgeon: Toledo, Benay Pike, MD;  Location: [facility];  Service: Gastroenterology;  Laterality: N/A;   FRACTURE SURGERY     LAPAROSCOPIC SUPRACERVICAL HYSTERECTOMY  2006   ovaries left in place   left elbow     left knee     Miscarriage  [place]   Family History  Problem Relation Age of Onset   Heart disease Mother        Incomplete heart block   Hypertension Mother    Diabetes Mother    Nephrolithiasis Mother    Migraines Mother    Arthritis Mother        degenerative-back,osteoporosis   Heart failure  Mother    Heart disease Father 62       Myocardial infarction   Heart failure Father    Heart disease Sister        h/o MI   Migraines Sister    Breast cancer Neg Hx    Social History   Socioeconomic History   Marital status: Married    Spouse name: Not on file   Number of children: Not on file   Years of education: Not on file   Highest education level: Not on file  Occupational History   Not on file  Tobacco Use   Smoking status: Former    Packs/day: 0.10    Years: 1.50    Total pack years: 0.15    Types: Cigarettes   Smokeless tobacco: Never  Vaping Use   Vaping Use: Never used  Substance and Sexual Activity   Alcohol use: No    Drug use: No   Sexual activity: Not on file  Other Topics Concern   Not on file  Social History Narrative   Not on file   Social Determinants of Health   Financial Resource Strain: Not on file  Food Insecurity: Not on file  Transportation Needs: Not on file  Physical Activity: Not on file  Stress: Not on file  Social Connections: Not on file     Review of Systems  Constitutional:  Positive for fatigue. Negative for appetite change and unexpected weight change.  HENT:  Negative for congestion and sinus pressure.   Respiratory:  Positive for cough. Negative for chest tightness.        Some sob with exertion.   Cardiovascular:  Positive for chest pain. Negative for palpitations and leg swelling.  Gastrointestinal:  Negative for abdominal pain, diarrhea, nausea and vomiting.  Genitourinary:  Negative for difficulty urinating and dysuria.  Musculoskeletal:  Negative for joint swelling and myalgias.  Skin:  Negative for color change and rash.  Neurological:  Negative for dizziness and headaches.  Psychiatric/Behavioral:  Negative for agitation and dysphoric mood.        Objective:     BP 132/76 (BP Location: Left Arm, Patient Position: Sitting, Cuff Size: Large)   Pulse (!) 104   Temp 98 F (36.7 C) (Temporal)   Resp 17   Ht _0  (1.702 m)   Wt 191 lb 12.8 oz (87 kg)   SpO2 96%   BMI 30.04 kg/m  Wt Readings from Last 3 Encounters:  09/08/22 191 lb 12.8 oz (87 kg)  05/07/22 274 lb (124.3 kg)  02/24/22 273 lb 3.2 oz (123.9 kg)    Physical Exam Vitals reviewed.  Constitutional:      General: She is not in acute distress.    Appearance: Normal appearance.  HENT:     Head: Normocephalic and atraumatic.     Right Ear: External ear normal.     Left Ear: External ear normal.  Eyes:     General: No scleral icterus.       Right eye: No discharge.        Left eye: No discharge.     Conjunctiva/sclera: Conjunctivae normal.  Neck:     Thyroid: No thyromegaly.   Cardiovascular:     Rate and Rhythm: Normal rate and regular rhythm.  Pulmonary:     Effort: No respiratory distress.     Breath sounds: Normal breath sounds. No wheezing.  Abdominal:     General: Bowel sounds are normal.     Palpations: Abdomen  is soft.     Tenderness: There is no abdominal tenderness.  Musculoskeletal:        General: No swelling or tenderness.     Cervical back: Neck supple. No tenderness.  Lymphadenopathy:     Cervical: No cervical adenopathy.  Skin:    Findings: No erythema or rash.  Neurological:     Mental Status: She is alert.  Psychiatric:        Mood and Affect: Mood normal.        Behavior: Behavior normal.      Outpatient Encounter Medications as of 09/08/2022  Medication Sig   acetaminophen (TYLENOL) 650 MG CR tablet Take 650 mg by mouth every 8 (eight) hours as needed for pain.   Blood Glucose Monitoring Suppl (CONTOUR NEXT MONITOR) w/Device KIT 1 kit by Does not apply route 2 (two) times daily. DX: E11.9   citalopram (CELEXA) 40 MG tablet Take 1 tablet by mouth once daily   CONTOUR NEXT TEST test strip USE TO CHECK BLOOD SUGAR TWICE DAILY   Microlet Lancets MISC USE   TO CHECK GLUCOSE TWICE DAILY   NEEDLE, DISP, 25 G (B-D DISP NEEDLE 25GX1") 25G X 1" MISC Used to give monthly B-12 injections   ondansetron (ZOFRAN ODT) 4 MG disintegrating tablet Take 1 tablet (4 mg total) by mouth 2 (two) times daily as needed for nausea or vomiting.   ondansetron (ZOFRAN) 4 MG tablet Take 4 mg by mouth every 8 (eight) hours as needed for nausea or vomiting.   spironolactone (ALDACTONE) 25 MG tablet Take 1 tablet by mouth once daily   SYRINGE-NEEDLE, DISP, 3 ML (B-D INTEGRA SYRINGE) 25G X 5/8" 3 ML MISC Used to draw monthly B-12 injections.   [DISCONTINUED] amLODipine (NORVASC) 10 MG tablet Take 1 tablet by mouth once daily   [DISCONTINUED] atorvastatin (LIPITOR) 40 MG tablet Take 1 tablet by mouth once daily   [DISCONTINUED] busPIRone (BUSPAR) 5 MG tablet Take  1 tablet (5 mg total) by mouth 2 (two) times daily. TAKE 1 TABLET(5 MG) BY MOUTH DAILY AS NEEDED   [DISCONTINUED] cyanocobalamin (VITAMIN B12) 1000 MCG/ML injection Inject 1 mL (1,000 mcg total) into the muscle every 30 (thirty) days.   [DISCONTINUED] lisinopril (ZESTRIL) 40 MG tablet Take 1 tablet by mouth once daily   [DISCONTINUED] pantoprazole (PROTONIX) 40 MG tablet Take 1 tablet (40 mg total) by mouth daily.   amLODipine (NORVASC) 10 MG tablet Take 1 tablet (10 mg total) by mouth daily.   atorvastatin (LIPITOR) 40 MG tablet Take 1 tablet (40 mg total) by mouth daily.   busPIRone (BUSPAR) 5 MG tablet Take 1 tablet (5 mg total) by mouth 2 (two) times daily. TAKE 1 TABLET(5 MG) BY MOUTH DAILY AS NEEDED   cyanocobalamin (VITAMIN B12) 1000 MCG/ML injection Inject 1 mL (1,000 mcg total) into the muscle every 30 (thirty) days.   lisinopril (ZESTRIL) 40 MG tablet Take 1 tablet (40 mg total) by mouth daily.   [DISCONTINUED] CONTOUR NEXT TEST test strip USE TO CHECK BLOOD SUGAR TWICE DAILY   [DISCONTINUED] Microlet Lancets MISC USE   TO CHECK GLUCOSE TWICE DAILY   No facility-administered encounter medications on file as of 09/08/2022.     Lab Results  Component Value Date   WBC 8.6 09/03/2022   HGB 13.8 09/03/2022   HCT 41.1 09/03/2022   PLT 278.0 09/03/2022   GLUCOSE 111 (H) 09/03/2022   CHOL 142 09/03/2022   TRIG 105.0 09/03/2022   HDL 54.50 09/03/2022   [place]  66 09/03/2022   ALT 28 09/03/2022   AST 25 09/03/2022   NA 136 09/03/2022   K 4.7 09/08/2022   CL 101 09/03/2022   CREATININE 0.78 09/03/2022   BUN 10 09/03/2022   CO2 27 09/03/2022   TSH 1.09 12/19/2021   HGBA1C 6.4 09/03/2022    MM 3D SCREEN BREAST BILATERAL  Result Date: 09/10/2021 CLINICAL DATA:  Screening. EXAM: DIGITAL SCREENING BILATERAL MAMMOGRAM WITH TOMOSYNTHESIS AND CAD TECHNIQUE: Bilateral screening digital craniocaudal and mediolateral oblique mammograms were obtained. Bilateral screening digital breast  tomosynthesis was performed. The images were evaluated with computer-aided detection. COMPARISON:  Previous exam(s). ACR Breast Density Category a: The breast tissue is almost entirely fatty. FINDINGS: There are no findings suspicious for malignancy. IMPRESSION: No mammographic evidence of malignancy. A result letter of this screening mammogram will be mailed directly to the patient. RECOMMENDATION: Screening mammogram in one year. (Code:[id number]-B-01Y) BI-RADS CATEGORY  1: Negative. Electronically Signed   By: Ammie Ferrier M.D.   On: 09/10/2021 14:51      Assessment & Plan:   Problem List Items Addressed This Visit     Anemia    Recent hgb wnl.       Relevant Medications   cyanocobalamin (VITAMIN B12) 1000 MCG/ML injection   Anxiety    Increased stress as outlined.  Discussed.  Reports doing well.  Continue celexa. No changes.  Follow.       Relevant Medications   busPIRone (BUSPAR) 5 MG tablet   Chest pain    Chest pain and sob with exertion as outlined.  Episodes also when laughing hard, coughing, cold air, etc. EKG today - SR, non specific ST/T changes.  Discussed further w/up and evaluation.  Discussed cardiology referral.  She declines at this time.  Wants to speak with her husband and will call back if agreeable for referral and with name of cardiologist.  Continue risk factor modification.       Relevant Orders   EKG 12-Lead (Completed)   Cough    Persistent cough.  Will check cxr to confirm lungs clear.       Relevant Orders   DG Chest 2 View (Completed)   Essential hypertension, benign    Continue amlodipine, lisinopril and Aldactone.  Blood pressure as outlined.  Reviewed outside checks. Doing well. Follow pressures.  Follow metabolic panel.      Relevant Medications   amLODipine (NORVASC) 10 MG tablet   atorvastatin (LIPITOR) 40 MG tablet   lisinopril (ZESTRIL) 40 MG tablet   Other Relevant Orders   Basic Metabolic Panel (BMET)   Fall    No head injury.  No  residual problems from the fall.  Discussed fall precautions.  Follow.       GERD (gastroesophageal reflux disease)    No upper symptoms reported.  On Protonix.      Hypercholesterolemia - Primary    On Lipitor.  Low-cholesterol diet and exercise.  Follow lipid panel liver function tests.   Lab Results  Component Value Date   CHOL 142 09/03/2022   HDL 54.50 09/03/2022   LDLCALC 66 09/03/2022   TRIG 105.0 09/03/2022   CHOLHDL 3 09/03/2022       Relevant Medications   amLODipine (NORVASC) 10 MG tablet   atorvastatin (LIPITOR) 40 MG tablet   lisinopril (ZESTRIL) 40 MG tablet   Other Relevant Orders   Lipid Profile   Hepatic function panel   Hyperglycemia    Low-carb diet and exercise.  Follow metb and A1c.  Lab Results  Component Value Date   HGBA1C 6.4 09/03/2022       Relevant Orders   HgB A1c   Sleep apnea    Continue  cpap.       Other Visit Diagnoses     B12 deficiency       Relevant Medications   cyanocobalamin (VITAMIN B12) 1000 MCG/ML injection   Hyperkalemia       Relevant Orders   Potassium (Completed)        Einar Pheasant, MD

## 2022-09-08 NOTE — Assessment & Plan Note (Signed)
Recent hgb wnl.

## 2022-09-08 NOTE — Assessment & Plan Note (Signed)
Continue cpap.  

## 2022-09-08 NOTE — Assessment & Plan Note (Signed)
No upper symptoms reported.  On Protonix.

## 2022-09-08 NOTE — Assessment & Plan Note (Signed)
Increased stress as outlined.  Discussed.  Reports doing well.  Continue celexa. No changes.  Follow.

## 2022-09-08 NOTE — Assessment & Plan Note (Addendum)
Chest pain and sob with exertion as outlined.  Episodes also when laughing hard, coughing, cold air, etc. EKG today - SR, non specific ST/T changes.  Discussed further w/up and evaluation.  Discussed cardiology referral.  She declines at this time.  Wants to speak with her husband and will call back if agreeable for referral and with name of cardiologist.  Continue risk factor modification.

## 2022-09-08 NOTE — Assessment & Plan Note (Signed)
On Lipitor.  Low-cholesterol diet and exercise.  Follow lipid panel liver function tests.   Lab Results  Component Value Date   CHOL 142 09/03/2022   HDL 54.50 09/03/2022   LDLCALC 66 09/03/2022   TRIG 105.0 09/03/2022   CHOLHDL 3 09/03/2022

## 2022-09-08 NOTE — Assessment & Plan Note (Signed)
Persistent cough.  Will check cxr to confirm lungs clear.

## 2022-09-08 NOTE — Assessment & Plan Note (Signed)
Continue amlodipine, lisinopril and Aldactone.  Blood pressure as outlined.  Reviewed outside checks. Doing well. Follow pressures.  Follow metabolic panel.

## 2022-09-09 ENCOUNTER — Telehealth: Payer: Self-pay

## 2022-09-09 NOTE — Telephone Encounter (Signed)
Patient states she is returning a call from Denita Lung, [place].  I did not see a message from [facility] ([place]) and was unable to reach her so I let patient know I will send a message so she can call her back.

## 2022-09-12 ENCOUNTER — Other Ambulatory Visit: Payer: Self-pay | Admitting: Internal Medicine

## 2022-09-14 ENCOUNTER — Telehealth: Payer: Self-pay | Admitting: Internal Medicine

## 2022-09-14 ENCOUNTER — Encounter: Payer: Self-pay | Admitting: Internal Medicine

## 2022-09-14 DIAGNOSIS — W19XXXA Unspecified fall, initial encounter: Secondary | ICD-10-CM | POA: Insufficient documentation

## 2022-09-14 NOTE — Assessment & Plan Note (Signed)
No head injury.  No residual problems from the fall.  Discussed fall precautions.  Follow.

## 2022-09-14 NOTE — Telephone Encounter (Signed)
Saw her last week.  She was supposed to discuss with her husband regarding referral to cardiology.  Need to know who she prefers and if agreeable to referral.

## 2022-09-15 ENCOUNTER — Ambulatory Visit
Admission: RE | Admit: 2022-09-15 | Discharge: 2022-09-15 | Disposition: A | Discharge status: Home / Self Care | Payer: Managed Care, Other (non HMO) | Source: Ambulatory Visit | Attending: Internal Medicine | Admitting: Internal Medicine

## 2022-09-15 ENCOUNTER — Other Ambulatory Visit: Payer: Self-pay | Admitting: Internal Medicine

## 2022-09-15 DIAGNOSIS — Z1231 Encounter for screening mammogram for malignant neoplasm of breast: Secondary | ICD-10-CM | POA: Diagnosis present

## 2022-09-16 NOTE — Telephone Encounter (Signed)
S/w pt - has not discussed referral with John. Pt stated to hold off for now, will think about it and discuss and let us know when she is ready to proceed

## 2022-09-22 ENCOUNTER — Other Ambulatory Visit: Payer: Self-pay

## 2022-09-22 ENCOUNTER — Encounter: Payer: Self-pay | Admitting: Internal Medicine

## 2022-09-22 ENCOUNTER — Other Ambulatory Visit: Payer: Self-pay | Admitting: Internal Medicine

## 2022-09-22 MED ORDER — ATORVASTATIN CALCIUM 40 MG PO TABS: 40.0000 mg | ORAL_TABLET | Freq: Every day | ORAL | 1 refills | Status: DC

## 2022-09-22 MED ORDER — LISINOPRIL 40 MG PO TABS: 40.0000 mg | ORAL_TABLET | Freq: Every day | ORAL | 1 refills | Status: DC

## 2022-09-22 MED ORDER — AMLODIPINE BESYLATE 10 MG PO TABS: 10.0000 mg | ORAL_TABLET | Freq: Every day | ORAL | 1 refills | Status: DC

## 2022-09-22 MED ORDER — BUSPIRONE HCL 5 MG PO TABS: 5.0000 mg | ORAL_TABLET | Freq: Two times a day (BID) | ORAL | 1 refills | Status: DC

## 2022-10-01 ENCOUNTER — Other Ambulatory Visit: Payer: Self-pay

## 2022-10-01 MED ORDER — BUSPIRONE HCL 5 MG PO TABS: 5.0000 mg | ORAL_TABLET | Freq: Two times a day (BID) | ORAL | 1 refills | Status: DC

## 2022-10-15 ENCOUNTER — Other Ambulatory Visit: Payer: Self-pay | Admitting: Internal Medicine

## 2022-10-22 ENCOUNTER — Encounter: Payer: Self-pay | Admitting: Internal Medicine

## 2022-10-23 NOTE — Telephone Encounter (Signed)
She is already on lisinopril and amlodipine.  Spironolactone is a generic medication and an old medication.  I am not sure why would be expensive. Please clarify with pharmacy cost.  (And can check preferred medications).  Her blood pressure on these three medications have done well.

## 2022-10-26 ENCOUNTER — Other Ambulatory Visit: Payer: Self-pay | Admitting: Internal Medicine

## 2022-10-31 ENCOUNTER — Encounter: Payer: Self-pay | Admitting: Internal Medicine

## 2022-10-31 ENCOUNTER — Ambulatory Visit (INDEPENDENT_AMBULATORY_CARE_PROVIDER_SITE_OTHER): Payer: Managed Care, Other (non HMO) | Admitting: Internal Medicine

## 2022-10-31 VITALS — BP 122/68 | HR 68 | Temp 98.2°F | Resp 16 | Ht 67.0 in | Wt 270.0 lb

## 2022-10-31 DIAGNOSIS — F419 Anxiety disorder, unspecified: Secondary | ICD-10-CM | POA: Diagnosis not present

## 2022-10-31 DIAGNOSIS — E7800 Pure hypercholesterolemia, unspecified: Secondary | ICD-10-CM

## 2022-10-31 DIAGNOSIS — R739 Hyperglycemia, unspecified: Secondary | ICD-10-CM

## 2022-10-31 DIAGNOSIS — G4730 Sleep apnea, unspecified: Secondary | ICD-10-CM

## 2022-10-31 DIAGNOSIS — K219 Gastro-esophageal reflux disease without esophagitis: Secondary | ICD-10-CM

## 2022-10-31 DIAGNOSIS — I10 Essential (primary) hypertension: Secondary | ICD-10-CM

## 2022-10-31 DIAGNOSIS — R079 Chest pain, unspecified: Secondary | ICD-10-CM | POA: Diagnosis not present

## 2022-10-31 DIAGNOSIS — R0981 Nasal congestion: Secondary | ICD-10-CM

## 2022-10-31 MED ORDER — AMOXICILLIN-POT CLAVULANATE 875-125 MG PO TABS: 1.0000 | ORAL_TABLET | Freq: Two times a day (BID) | ORAL | 0 refills | Status: DC

## 2022-10-31 NOTE — Telephone Encounter (Signed)
Reviewed at appt.

## 2022-10-31 NOTE — Patient Instructions (Signed)
Saline nasal spray - flush nose 2x day  Robitussin DM  Take a probiotic daily while on the antibiotic and for two weeks after completing the antibiotic.    Examples of probiotics:  culturelle, align or florastor.

## 2022-10-31 NOTE — Progress Notes (Signed)
[name], female   DOB: [birth year], 62 y.o.   MRN: [id number]   Subjective:    [name], female    DOB: April 11, 1961, 62 y.o.   MRN: [id number]   Patient here for  Chief Complaint  Patient presents with   Medical Management of Chronic Issues   .   HPI Here to follow up regarding increased stress, hypertension and hypercholesterolemia.  Increased stress related to family medical issues.  Overall she feels she is doing relatively well and handling things relatively well.   Blood pressures have been doing well.  Using CPAP regularly.  Last visit, reported noticing sob with exertion.  Some intermittent chest pain.  Also reported noticing with increased coughing, increased laughing, cold air or when leans over to help husband with shoes and socks - will have to hold on to something and sit down.  States everthing "goes dark" for a brief period.  No actual syncope. Discussed again referral for further w/up and evaluation.  She declined.  Feels everything is stable.  She does report increased sinus pressure and nasal congestion.  Previous ear pain.  This has resolved.  Some cough.  No increased chest congestion.  No vomiting or diarrhea. Had one episode of diarrhea after eating pizza.   No abdominal pain.     Past Medical History:  Diagnosis Date   Allergy    Anemia    Cancer ([place])    Depression    Diverticulosis    FHx: migraine headaches    GERD (gastroesophageal reflux disease)    History of chickenpox    Hypercholesterolemia    Hyperlipidemia    Hypertension    Osteoarthritis    Panic attacks    Sleep apnea    Past Surgical History:  Procedure Laterality Date   ABDOMINAL HYSTERECTOMY     CATARACT EXTRACTION W/PHACO Left 08/11/2017   Procedure: CATARACT EXTRACTION PHACO AND INTRAOCULAR LENS PLACEMENT ([place]);  Surgeon: Birder Robson, MD;  Location: [facility];  Service: Ophthalmology;  Laterality: Left;  [place] 00:31 AP% 19.2 CDE 6.13 Fluid Pack lot #  [id number]   CATARACT EXTRACTION W/PHACO Right 09/01/2017   Procedure: CATARACT EXTRACTION PHACO AND INTRAOCULAR LENS PLACEMENT ([facility]);  Surgeon: Birder Robson, MD;  Location: [facility];  Service: Ophthalmology;  Laterality: Right;  [place] 00:30 AP% 11.7 CDE3.52 fluid pack lot #[id number]   Child Birth  1981 and 1979   COLONOSCOPY     COLONOSCOPY WITH PROPOFOL N/A 08/25/2018   Procedure: COLONOSCOPY WITH PROPOFOL;  Surgeon: Toledo, Benay Pike, MD;  Location: [facility];  Service: Gastroenterology;  Laterality: N/A;   ESOPHAGOGASTRODUODENOSCOPY     ESOPHAGOGASTRODUODENOSCOPY (EGD) WITH PROPOFOL N/A 08/25/2018   Procedure: ESOPHAGOGASTRODUODENOSCOPY (EGD) WITH PROPOFOL;  Surgeon: Toledo, Benay Pike, MD;  Location: [facility];  Service: Gastroenterology;  Laterality: N/A;   FRACTURE SURGERY     LAPAROSCOPIC SUPRACERVICAL HYSTERECTOMY  2006   ovaries left in place   left elbow     left knee     Miscarriage  [place]   Family History  Problem Relation Age of Onset   Heart disease Mother        Incomplete heart block   Hypertension Mother    Diabetes Mother    Nephrolithiasis Mother    Migraines Mother    Arthritis Mother        degenerative-back,osteoporosis   Heart failure Mother    Heart disease Father  31       Myocardial infarction   Heart failure Father    Heart disease Sister        h/o MI   Migraines Sister    Breast cancer Neg Hx    Social History   Socioeconomic History   Marital status: Married    Spouse name: Not on file   Number of children: Not on file   Years of education: Not on file   Highest education level: Not on file  Occupational History   Not on file  Tobacco Use   Smoking status: Former    Packs/day: 0.10    Years: 1.50    Total pack years: 0.15    Types: Cigarettes   Smokeless tobacco: Never  Vaping Use   Vaping Use: Never used  Substance and Sexual Activity   Alcohol use: No   Drug use: No    Sexual activity: Not on file  Other Topics Concern   Not on file  Social History Narrative   Not on file   Social Determinants of Health   Financial Resource Strain: Not on file  Food Insecurity: Not on file  Transportation Needs: Not on file  Physical Activity: Not on file  Stress: Not on file  Social Connections: Not on file     Review of Systems  Constitutional:  Positive for fatigue. Negative for appetite change and unexpected weight change.  HENT:  Negative for congestion and sinus pressure.   Respiratory:  Positive for cough. Negative for chest tightness.        Some sob with exertion.   Cardiovascular:  Positive for chest pain. Negative for palpitations and leg swelling.  Gastrointestinal:  Negative for abdominal pain, diarrhea, nausea and vomiting.  Genitourinary:  Negative for difficulty urinating and dysuria.  Musculoskeletal:  Negative for joint swelling and myalgias.  Skin:  Negative for color change and rash.  Neurological:  Negative for dizziness and headaches.  Psychiatric/Behavioral:  Negative for agitation and dysphoric mood.        Objective:     BP 122/68   Pulse 68   Temp 98.2 F (36.8 C)   Resp 16   Ht '5\' 7"'$  (1.702 m)   Wt 270 lb (122.5 kg)   SpO2 98%   BMI 42.29 kg/m  Wt Readings from Last 3 Encounters:  10/31/22 270 lb (122.5 kg)  09/08/22 191 lb 12.8 oz (87 kg)  05/07/22 274 lb (124.3 kg)    Physical Exam Vitals reviewed.  Constitutional:      General: She is not in acute distress.    Appearance: Normal appearance.  HENT:     Head: Normocephalic and atraumatic.     Right Ear: External ear normal.     Left Ear: External ear normal.  Eyes:     General: No scleral icterus.       Right eye: No discharge.        Left eye: No discharge.     Conjunctiva/sclera: Conjunctivae normal.  Neck:     Thyroid: No thyromegaly.  Cardiovascular:     Rate and Rhythm: Normal rate and regular rhythm.  Pulmonary:     Effort: No respiratory  distress.     Breath sounds: Normal breath sounds. No wheezing.  Abdominal:     General: Bowel sounds are normal.     Palpations: Abdomen is soft.     Tenderness: There is no abdominal tenderness.  Musculoskeletal:        General: No  swelling or tenderness.     Cervical back: Neck supple. No tenderness.  Lymphadenopathy:     Cervical: No cervical adenopathy.  Skin:    Findings: No erythema or rash.  Neurological:     Mental Status: She is alert.  Psychiatric:        Mood and Affect: Mood normal.        Behavior: Behavior normal.      Outpatient Encounter Medications as of 10/31/2022  Medication Sig   amoxicillin-clavulanate (AUGMENTIN) 875-125 MG tablet Take 1 tablet by mouth 2 (two) times daily.   acetaminophen (TYLENOL) 650 MG CR tablet Take 650 mg by mouth every 8 (eight) hours as needed for pain.   amLODipine (NORVASC) 10 MG tablet Take 1 tablet (10 mg total) by mouth daily.   atorvastatin (LIPITOR) 40 MG tablet Take 1 tablet (40 mg total) by mouth daily.   Blood Glucose Monitoring Suppl (CONTOUR NEXT MONITOR) w/Device KIT 1 kit by Does not apply route 2 (two) times daily. DX: E11.9   busPIRone (BUSPAR) 5 MG tablet Take 1 tablet (5 mg total) by mouth 2 (two) times daily. TAKE 1 TABLET(5 MG) BY MOUTH DAILY AS NEEDED   citalopram (CELEXA) 40 MG tablet Take 1 tablet by mouth once daily   CONTOUR NEXT TEST test strip USE TO CHECK BLOOD SUGAR TWICE DAILY   cyanocobalamin (VITAMIN B12) 1000 MCG/ML injection Inject 1 mL (1,000 mcg total) into the muscle every 30 (thirty) days.   lisinopril (ZESTRIL) 40 MG tablet Take 1 tablet (40 mg total) by mouth daily.   Microlet Lancets MISC USE TO CHECK BLOOD SUGAR TWICE DAILY   NEEDLE, DISP, 25 G (B-D DISP NEEDLE 25GX1") 25G X 1" MISC Used to give monthly B-12 injections   ondansetron (ZOFRAN ODT) 4 MG disintegrating tablet Take 1 tablet (4 mg total) by mouth 2 (two) times daily as needed for nausea or vomiting.   ondansetron (ZOFRAN) 4 MG  tablet Take 4 mg by mouth every 8 (eight) hours as needed for nausea or vomiting.   pantoprazole (PROTONIX) 40 MG tablet Take 1 tablet by mouth once daily   spironolactone (ALDACTONE) 25 MG tablet Take 1 tablet by mouth once daily   SYRINGE-NEEDLE, DISP, 3 ML (B-D INTEGRA SYRINGE) 25G X 5/8" 3 ML MISC Used to draw monthly B-12 injections.   No facility-administered encounter medications on file as of 10/31/2022.     Lab Results  Component Value Date   WBC 8.6 09/03/2022   HGB 13.8 09/03/2022   HCT 41.1 09/03/2022   PLT 278.0 09/03/2022   GLUCOSE 111 (H) 09/03/2022   CHOL 142 09/03/2022   TRIG 105.0 09/03/2022   HDL 54.50 09/03/2022   LDLCALC 66 09/03/2022   ALT 28 09/03/2022   AST 25 09/03/2022   NA 136 09/03/2022   K 4.7 09/08/2022   CL 101 09/03/2022   CREATININE 0.78 09/03/2022   BUN 10 09/03/2022   CO2 27 09/03/2022   TSH 1.09 12/19/2021   HGBA1C 6.4 09/03/2022    MM 3D SCREEN BREAST BILATERAL  Result Date: 09/10/2021 CLINICAL DATA:  Screening. EXAM: DIGITAL SCREENING BILATERAL MAMMOGRAM WITH TOMOSYNTHESIS AND CAD TECHNIQUE: Bilateral screening digital craniocaudal and mediolateral oblique mammograms were obtained. Bilateral screening digital breast tomosynthesis was performed. The images were evaluated with computer-aided detection. COMPARISON:  Previous exam(s). ACR Breast Density Category a: The breast tissue is almost entirely fatty. FINDINGS: There are no findings suspicious for malignancy. IMPRESSION: No mammographic evidence of malignancy. A result letter of  this screening mammogram will be mailed directly to the patient. RECOMMENDATION: Screening mammogram in one year. (Code:[id number]-B-01Y) BI-RADS CATEGORY  1: Negative. Electronically Signed   By: Ammie Ferrier M.D.   On: 09/10/2021 14:51      Assessment & Plan:   Problem List Items Addressed This Visit     Sleep apnea    Continue  cpap.       Sinus congestion    Increased sinus pressure and nasal congestion  as outlined.  Augmentin as directed.  Saline nasal spray and robitussin DM.  Follow.  Call with update.       Hyperglycemia    Low-carb diet and exercise.  Follow metb and A1c.  Lab Results  Component Value Date   HGBA1C 6.4 09/03/2022       Hypercholesterolemia    On Lipitor.  Low-cholesterol diet and exercise.  Follow lipid panel liver function tests.   Lab Results  Component Value Date   CHOL 142 09/03/2022   HDL 54.50 09/03/2022   LDLCALC 66 09/03/2022   TRIG 105.0 09/03/2022   CHOLHDL 3 09/03/2022       GERD (gastroesophageal reflux disease)    No upper symptoms reported.  On Protonix.      Essential hypertension, benign    Continue amlodipine, lisinopril and Aldactone.  Blood pressure as outlined.  Reviewed outside checks. Doing well. Follow pressures.  Follow metabolic panel.      Chest pain    Chest pain and sob with exertion as outlined.  Episodes also when laughing hard, coughing, cold air, etc. EKG last visit- SR, non specific ST/T changes.  Discussed further w/up and evaluation.  Discussed cardiology referral.  She continues to decline.   Continue risk factor modification.       Anxiety - Primary    Increased stress as outlined.  Discussed.  Reports doing well.  Continue celexa. No changes.  Follow.        Einar Pheasant, MD

## 2022-10-31 NOTE — Telephone Encounter (Signed)
Printed and placed in results folder 

## 2022-11-09 ENCOUNTER — Encounter: Payer: Self-pay | Admitting: Internal Medicine

## 2022-11-09 DIAGNOSIS — R0981 Nasal congestion: Secondary | ICD-10-CM | POA: Insufficient documentation

## 2022-11-09 NOTE — Assessment & Plan Note (Signed)
Continue amlodipine, lisinopril and Aldactone.  Blood pressure as outlined.  Reviewed outside checks. Doing well. Follow pressures.  Follow metabolic panel.

## 2022-11-09 NOTE — Assessment & Plan Note (Signed)
No upper symptoms reported.  On Protonix.

## 2022-11-09 NOTE — Assessment & Plan Note (Signed)
Low-carb diet and exercise.  Follow metb and A1c.  Lab Results  Component Value Date   HGBA1C 6.4 09/03/2022

## 2022-11-09 NOTE — Assessment & Plan Note (Signed)
Continue cpap.  

## 2022-11-09 NOTE — Assessment & Plan Note (Signed)
On Lipitor.  Low-cholesterol diet and exercise.  Follow lipid panel liver function tests.   Lab Results  Component Value Date   CHOL 142 09/03/2022   HDL 54.50 09/03/2022   LDLCALC 66 09/03/2022   TRIG 105.0 09/03/2022   CHOLHDL 3 09/03/2022

## 2022-11-09 NOTE — Assessment & Plan Note (Signed)
Chest pain and sob with exertion as outlined.  Episodes also when laughing hard, coughing, cold air, etc. EKG last visit- SR, non specific ST/T changes.  Discussed further w/up and evaluation.  Discussed cardiology referral.  She continues to decline.   Continue risk factor modification.

## 2022-11-09 NOTE — Assessment & Plan Note (Signed)
Increased stress as outlined.  Discussed.  Reports doing well.  Continue celexa. No changes.  Follow.

## 2022-11-09 NOTE — Assessment & Plan Note (Signed)
Increased sinus pressure and nasal congestion as outlined.  Augmentin as directed.  Saline nasal spray and robitussin DM.  Follow.  Call with update.

## 2022-12-26 ENCOUNTER — Other Ambulatory Visit (INDEPENDENT_AMBULATORY_CARE_PROVIDER_SITE_OTHER): Payer: Managed Care, Other (non HMO)

## 2022-12-26 DIAGNOSIS — E7800 Pure hypercholesterolemia, unspecified: Secondary | ICD-10-CM

## 2022-12-26 DIAGNOSIS — R739 Hyperglycemia, unspecified: Secondary | ICD-10-CM

## 2022-12-26 DIAGNOSIS — I10 Essential (primary) hypertension: Secondary | ICD-10-CM

## 2022-12-26 LAB — BASIC METABOLIC PANEL
BUN: 14 mg/dL (ref 6–23)
CO2: 25 mEq/L (ref 19–32)
Calcium: 10 mg/dL (ref 8.4–10.5)
Chloride: 104 mEq/L (ref 96–112)
Creatinine, Ser: 0.66 mg/dL (ref 0.40–1.20)
GFR: 93.11 mL/min (ref >60.00)
Glucose, Bld: 108 mg/dL — ABNORMAL HIGH (ref 70–99)
Potassium: 3.9 mEq/L (ref 3.5–5.1)
Sodium: 141 mEq/L (ref 135–145)

## 2022-12-26 LAB — HEMOGLOBIN A1C: Hgb A1c MFr Bld: 5.9 % (ref 4.6–6.5)

## 2022-12-26 LAB — HEPATIC FUNCTION PANEL
ALT: 22 U/L (ref 0–35)
AST: 21 U/L (ref 0–37)
Albumin: 4.4 g/dL (ref 3.5–5.2)
Alkaline Phosphatase: 53 U/L (ref 39–117)
Bilirubin, Direct: 0.1 mg/dL (ref 0.0–0.3)
Total Bilirubin: 0.4 mg/dL (ref 0.2–1.2)
Total Protein: 7.5 g/dL (ref 6.0–8.3)

## 2022-12-26 LAB — LIPID PANEL
Cholesterol: 159 mg/dL (ref 0–200)
HDL: 48.6 mg/dL (ref >39.00)
NonHDL: 110.72
Total CHOL/HDL Ratio: 3
Triglycerides: 206 mg/dL — ABNORMAL HIGH (ref 0.0–149.0)
VLDL: 41.2 mg/dL — ABNORMAL HIGH (ref 0.0–40.0)

## 2022-12-26 LAB — LDL CHOLESTEROL, DIRECT: Direct LDL: 73 mg/dL

## 2022-12-27 ENCOUNTER — Other Ambulatory Visit: Payer: Self-pay | Admitting: Internal Medicine

## 2022-12-30 ENCOUNTER — Encounter: Payer: Self-pay | Admitting: Internal Medicine

## 2022-12-31 ENCOUNTER — Ambulatory Visit (INDEPENDENT_AMBULATORY_CARE_PROVIDER_SITE_OTHER): Payer: Managed Care, Other (non HMO) | Admitting: Nurse Practitioner

## 2022-12-31 ENCOUNTER — Encounter: Payer: Self-pay | Admitting: Nurse Practitioner

## 2022-12-31 VITALS — BP 128/82 | HR 80 | Temp 99.0°F | Ht 67.0 in | Wt 270.0 lb

## 2022-12-31 DIAGNOSIS — J40 Bronchitis, not specified as acute or chronic: Secondary | ICD-10-CM | POA: Diagnosis not present

## 2022-12-31 MED ORDER — AZITHROMYCIN 250 MG PO TABS: ORAL_TABLET | ORAL | 0 refills | Status: AC

## 2022-12-31 MED ORDER — ALBUTEROL SULFATE HFA 108 (90 BASE) MCG/ACT IN AERS: 2.0000 | INHALATION_SPRAY | Freq: Four times a day (QID) | RESPIRATORY_TRACT | 0 refills | Status: DC | PRN

## 2022-12-31 NOTE — Assessment & Plan Note (Signed)
Symptoms and duration consistent with bronchitis. Will treat with Zpak and albuterol inhaler. She can continue Robitussin and saline nasal spray. Advised adequate fluid intake. Return precautions given to patient.

## 2022-12-31 NOTE — Progress Notes (Signed)
Heather Morrow, NP-C Phone: [phone]  DEBORAHA Blake is a 62 y.o. female who presents today for congestion. Reports being sick 2 months ago and being treated with Augmentin. Her symptoms resolved at the time and have now returned, approximately 4 days ago. Reports they are the same as previously, but she now has left ear pain with popping and muffled hearing.   Respiratory illness:  Cough- Yes  Congestion-    Sinus- Yes- nasal, pressure   Chest- Yes  Post nasal drip- Yes  Sore throat- Irritation  Shortness of breath- On exertion  Fever- No  Fatigue/Myalgia- Yes Headache- Yes Nausea/Vomiting- No Taste disturbance- No  Smell disturbance- No  Covid exposure- No  Covid vaccination- x 3  Flu vaccination- Declined  Medications- Robitussin, Saline nasal spray   Social History   Tobacco Use  Smoking Status Former   Packs/day: 0.10   Years: 1.50   Additional pack years: 0.00   Total pack years: 0.15   Types: Cigarettes  Smokeless Tobacco Never    Current Outpatient Medications on File Prior to Visit  Medication Sig Dispense Refill   acetaminophen (TYLENOL) 650 MG CR tablet Take 650 mg by mouth every 8 (eight) hours as needed for pain.     amLODipine (NORVASC) 10 MG tablet Take 1 tablet (10 mg total) by mouth daily. 90 tablet 1   atorvastatin (LIPITOR) 40 MG tablet Take 1 tablet (40 mg total) by mouth daily. 90 tablet 1   Blood Glucose Monitoring Suppl (CONTOUR NEXT MONITOR) w/Device KIT 1 kit by Does not apply route 2 (two) times daily. DX: E11.9 1 kit 0   busPIRone (BUSPAR) 5 MG tablet Take 1 tablet (5 mg total) by mouth 2 (two) times daily. TAKE 1 TABLET(5 MG) BY MOUTH DAILY AS NEEDED 180 tablet 1   citalopram (CELEXA) 40 MG tablet Take 1 tablet by mouth once daily 90 tablet 0   cyanocobalamin (VITAMIN B12) 1000 MCG/ML injection Inject 1 mL (1,000 mcg total) into the muscle every 30 (thirty) days. 3 mL 0   glucose blood (CONTOUR NEXT TEST) test strip USE 1 STRIP TO CHECK  GLUCOSE TWICE DAILY 100 each 0   lisinopril (ZESTRIL) 40 MG tablet Take 1 tablet (40 mg total) by mouth daily. 90 tablet 1   Microlet Lancets MISC USE 1  TO CHECK GLUCOSE TWICE DAILY 100 each 0   NEEDLE, DISP, 25 G (B-D DISP NEEDLE 25GX1") 25G X 1" MISC Used to give monthly B-12 injections 50 each 0   ondansetron (ZOFRAN ODT) 4 MG disintegrating tablet Take 1 tablet (4 mg total) by mouth 2 (two) times daily as needed for nausea or vomiting. 20 tablet 0   ondansetron (ZOFRAN) 4 MG tablet Take 4 mg by mouth every 8 (eight) hours as needed for nausea or vomiting.     pantoprazole (PROTONIX) 40 MG tablet Take 1 tablet by mouth once daily 90 tablet 0   spironolactone (ALDACTONE) 25 MG tablet Take 1 tablet by mouth once daily 90 tablet 0   SYRINGE-NEEDLE, DISP, 3 ML (B-D INTEGRA SYRINGE) 25G X 5/8" 3 ML MISC Used to draw monthly B-12 injections. 50 each 0   No current facility-administered medications on file prior to visit.    ROS see history of present illness  Objective  Physical Exam Vitals:   12/31/22 1511  BP: 128/82  Pulse: 80  Temp: 99 F (37.2 C)  SpO2: 98%    BP Readings from Last 3 Encounters:  12/31/22 128/82  10/31/22  122/68  09/08/22 132/76   Wt Readings from Last 3 Encounters:  12/31/22 270 lb (122.5 kg)  10/31/22 270 lb (122.5 kg)  09/08/22 191 lb 12.8 oz (87 kg)    Physical Exam Constitutional:      General: She is not in acute distress.    Appearance: Normal appearance.  HENT:     Head: Normocephalic.     Right Ear: Tympanic membrane normal.     Left Ear: Tympanic membrane normal.     Nose: Congestion present.     Mouth/Throat:     Mouth: Mucous membranes are moist.     Pharynx: Oropharynx is clear.  Eyes:     Conjunctiva/sclera: Conjunctivae normal.     Pupils: Pupils are equal, round, and reactive to light.  Cardiovascular:     Rate and Rhythm: Normal rate and regular rhythm.     Heart sounds: Normal heart sounds.  Pulmonary:     Effort:  Pulmonary effort is normal.     Breath sounds: Normal breath sounds. No wheezing.  Abdominal:     General: Abdomen is flat. Bowel sounds are normal.     Palpations: Abdomen is soft. There is no mass.     Tenderness: There is no abdominal tenderness.  Lymphadenopathy:     Cervical: No cervical adenopathy.  Skin:    General: Skin is warm and dry.  Neurological:     General: No focal deficit present.     Mental Status: She is alert.  Psychiatric:        Mood and Affect: Mood normal.        Behavior: Behavior normal.    Assessment/Plan: Please see individual problem list.  Bronchitis Assessment & Plan: Symptoms and duration consistent with bronchitis. Will treat with Zpak and albuterol inhaler. She can continue Robitussin and saline nasal spray. Advised adequate fluid intake. Return precautions given to patient.   Orders: -     Azithromycin; Take 2 tablets on day 1, then 1 tablet daily on days 2 through 5  Dispense: 6 tablet; Refill: 0 -     Albuterol Sulfate HFA; Inhale 2 puffs into the lungs every 6 (six) hours as needed for wheezing or shortness of breath.  Dispense: 8 g; Refill: 0    Return if symptoms worsen or fail to improve.   Heather Morrow, NP-C [place]

## 2023-01-30 ENCOUNTER — Encounter: Payer: Self-pay | Admitting: Internal Medicine

## 2023-01-30 ENCOUNTER — Ambulatory Visit (INDEPENDENT_AMBULATORY_CARE_PROVIDER_SITE_OTHER): Payer: Managed Care, Other (non HMO) | Admitting: Internal Medicine

## 2023-01-30 VITALS — BP 130/80 | HR 96 | Temp 98.4°F | Resp 17 | Ht 67.0 in | Wt 269.1 lb

## 2023-01-30 DIAGNOSIS — I10 Essential (primary) hypertension: Secondary | ICD-10-CM | POA: Diagnosis not present

## 2023-01-30 DIAGNOSIS — R739 Hyperglycemia, unspecified: Secondary | ICD-10-CM | POA: Diagnosis not present

## 2023-01-30 DIAGNOSIS — K219 Gastro-esophageal reflux disease without esophagitis: Secondary | ICD-10-CM

## 2023-01-30 DIAGNOSIS — F419 Anxiety disorder, unspecified: Secondary | ICD-10-CM

## 2023-01-30 DIAGNOSIS — R079 Chest pain, unspecified: Secondary | ICD-10-CM

## 2023-01-30 DIAGNOSIS — E7800 Pure hypercholesterolemia, unspecified: Secondary | ICD-10-CM | POA: Diagnosis not present

## 2023-01-30 DIAGNOSIS — D649 Anemia, unspecified: Secondary | ICD-10-CM

## 2023-01-30 DIAGNOSIS — R000 Tachycardia, unspecified: Secondary | ICD-10-CM

## 2023-01-30 DIAGNOSIS — G4730 Sleep apnea, unspecified: Secondary | ICD-10-CM

## 2023-01-30 NOTE — Progress Notes (Unsigned)
Subjective:    [name], female    DOB: Jan 04, 1961, 62 y.o.   MRN: [id number]  Patient here for  Chief Complaint  Patient presents with   Chronic Care Management    3 mth f/u-husband noticed that her heart rate is up even when she is resting. Has been sending in reading through mychart.    HPI Here to follow up regarding increased stress, hypertension and hypercholesterolemia. Increased stress related to family medical issues. Overall she feels she is doing relatively well and handling things relatively well. Discussed possible referral to counselor.  She wants to hold at this time.  Will notify me if changes her mind. Blood pressures have been doing well. Reviewed outside readings.  Blood pressures mostly averaging 110-120s/70s.  Using cpap regularly.  She does report noticing heart rate increased more than previous.  Reviewed outside checks - mostly 70-90s.  Discussed possible etiologies - including increased stress - contributing.  Has had previous episodes of chest pain and increased sob - outlined in previous notes.  Has declined further w/up.  Continues to decline.  Discussed diet and exercise.  No abdominal pain or bowel change reported.     Past Medical History:  Diagnosis Date   Allergy    Anemia    Cancer    Depression    Diverticulosis    FHx: migraine headaches    GERD (gastroesophageal reflux disease)    History of chickenpox    Hypercholesterolemia    Hyperlipidemia    Hypertension    Osteoarthritis    Panic attacks    Sleep apnea    Past Surgical History:  Procedure Laterality Date   ABDOMINAL HYSTERECTOMY     CATARACT EXTRACTION W/PHACO Left 08/11/2017   Procedure: CATARACT EXTRACTION PHACO AND INTRAOCULAR LENS PLACEMENT (IOC);  Surgeon: Galen Manila, MD;  Location: [facility];  Service: Ophthalmology;  Laterality: Left;  [place] 00:31 AP% 19.2 CDE 6.13 Fluid Pack lot # [id number]   CATARACT EXTRACTION W/PHACO Right 09/01/2017   Procedure:  CATARACT EXTRACTION PHACO AND INTRAOCULAR LENS PLACEMENT (IOC);  Surgeon: Galen Manila, MD;  Location: [facility];  Service: Ophthalmology;  Laterality: Right;  [place] 00:30 AP% 11.7 CDE3.52 fluid pack lot #[id number]   Child Birth  1981 and 1979   COLONOSCOPY     COLONOSCOPY WITH PROPOFOL N/A 08/25/2018   Procedure: COLONOSCOPY WITH PROPOFOL;  Surgeon: Toledo, Boykin Nearing, MD;  Location: [facility];  Service: Gastroenterology;  Laterality: N/A;   ESOPHAGOGASTRODUODENOSCOPY     ESOPHAGOGASTRODUODENOSCOPY (EGD) WITH PROPOFOL N/A 08/25/2018   Procedure: ESOPHAGOGASTRODUODENOSCOPY (EGD) WITH PROPOFOL;  Surgeon: Toledo, Boykin Nearing, MD;  Location: [facility];  Service: Gastroenterology;  Laterality: N/A;   FRACTURE SURGERY     LAPAROSCOPIC SUPRACERVICAL HYSTERECTOMY  2006   ovaries left in place   left elbow     left knee     Miscarriage  1987   TONSILLECTOMY  1963   TUBAL LIGATION  1993   Family History  Problem Relation Age of Onset   Heart disease Mother        Incomplete heart block   Hypertension Mother    Diabetes Mother    Nephrolithiasis Mother    Migraines Mother    Arthritis Mother        degenerative-back,osteoporosis   Heart failure Mother    Heart disease Father 26       Myocardial infarction   Heart failure Father    Heart disease Sister  h/o MI   Migraines Sister    Breast cancer Neg Hx    Social History   Socioeconomic History   Marital status: Married    Spouse name: Not on file   Number of children: Not on file   Years of education: Not on file   Highest education level: Not on file  Occupational History   Not on file  Tobacco Use   Smoking status: Former    Packs/day: 0.10    Years: 1.50    Additional pack years: 0.00    Total pack years: 0.15    Types: Cigarettes   Smokeless tobacco: Never  Vaping Use   Vaping Use: Never used  Substance and Sexual Activity   Alcohol use: No   Drug use: No   Sexual activity: Not on file  Other  Topics Concern   Not on file  Social History Narrative   Not on file   Social Determinants of Health   Financial Resource Strain: Not on file  Food Insecurity: Not on file  Transportation Needs: Not on file  Physical Activity: Not on file  Stress: Not on file  Social Connections: Not on file     Review of Systems  Constitutional:  Negative for appetite change and unexpected weight change.  HENT:  Negative for congestion and sinus pressure.   Respiratory:  Negative for cough and chest tightness.        Breathing stable.    Cardiovascular:  Negative for chest pain and palpitations.  Gastrointestinal:  Negative for abdominal pain and vomiting.       Some intermittent nausea.    Genitourinary:  Negative for difficulty urinating and dysuria.  Musculoskeletal:  Negative for joint swelling and myalgias.  Skin:  Negative for color change and rash.  Neurological:  Negative for dizziness and headaches.  Psychiatric/Behavioral:  Negative for agitation and dysphoric mood.        Objective:     BP 130/80 (BP Location: Right Arm, Patient Position: Sitting, Cuff Size: Large)   Pulse 96   Temp 98.4 F (36.9 C) (Oral)   Resp 17   Ht 5\' 7"  (1.702 m)   Wt 269 lb 2 oz (122.1 kg)   SpO2 98%   BMI 42.15 kg/m  Wt Readings from Last 3 Encounters:  01/30/23 269 lb 2 oz (122.1 kg)  12/31/22 270 lb (122.5 kg)  10/31/22 270 lb (122.5 kg)    Physical Exam Vitals reviewed.  Constitutional:      General: She is not in acute distress.    Appearance: Normal appearance.  HENT:     Head: Normocephalic and atraumatic.     Right Ear: External ear normal.     Left Ear: External ear normal.  Eyes:     General: No scleral icterus.       Right eye: No discharge.        Left eye: No discharge.     Conjunctiva/sclera: Conjunctivae normal.  Neck:     Thyroid: No thyromegaly.  Cardiovascular:     Rate and Rhythm: Normal rate and regular rhythm.  Pulmonary:     Effort: No respiratory  distress.     Breath sounds: Normal breath sounds. No wheezing.  Abdominal:     General: Bowel sounds are normal.     Palpations: Abdomen is soft.     Tenderness: There is no abdominal tenderness.  Musculoskeletal:        General: No swelling or tenderness.  Cervical back: Neck supple. No tenderness.  Lymphadenopathy:     Cervical: No cervical adenopathy.  Skin:    Findings: No erythema or rash.  Neurological:     Mental Status: She is alert.  Psychiatric:        Mood and Affect: Mood normal.        Behavior: Behavior normal.      Outpatient Encounter Medications as of 01/30/2023  Medication Sig   acetaminophen (TYLENOL) 650 MG CR tablet Take 650 mg by mouth every 8 (eight) hours as needed for pain.   albuterol (VENTOLIN HFA) 108 (90 Base) MCG/ACT inhaler Inhale 2 puffs into the lungs every 6 (six) hours as needed for wheezing or shortness of breath.   amLODipine (NORVASC) 10 MG tablet Take 1 tablet (10 mg total) by mouth daily.   atorvastatin (LIPITOR) 40 MG tablet Take 1 tablet (40 mg total) by mouth daily.   Blood Glucose Monitoring Suppl (CONTOUR NEXT MONITOR) w/Device KIT 1 kit by Does not apply route 2 (two) times daily. DX: E11.9   busPIRone (BUSPAR) 5 MG tablet Take 1 tablet (5 mg total) by mouth 2 (two) times daily. TAKE 1 TABLET(5 MG) BY MOUTH DAILY AS NEEDED   citalopram (CELEXA) 40 MG tablet Take 1 tablet by mouth once daily   cyanocobalamin (VITAMIN B12) 1000 MCG/ML injection Inject 1 mL (1,000 mcg total) into the muscle every 30 (thirty) days.   glucose blood (CONTOUR NEXT TEST) test strip USE 1 STRIP TO CHECK GLUCOSE TWICE DAILY   lisinopril (ZESTRIL) 40 MG tablet Take 1 tablet (40 mg total) by mouth daily.   Microlet Lancets MISC USE 1  TO CHECK GLUCOSE TWICE DAILY   NEEDLE, DISP, 25 G (B-D DISP NEEDLE 25GX1") 25G X 1" MISC Used to give monthly B-12 injections   ondansetron (ZOFRAN) 4 MG tablet Take 4 mg by mouth every 8 (eight) hours as needed for nausea or  vomiting.   pantoprazole (PROTONIX) 40 MG tablet Take 1 tablet by mouth once daily   spironolactone (ALDACTONE) 25 MG tablet Take 1 tablet by mouth once daily   SYRINGE-NEEDLE, DISP, 3 ML (B-D INTEGRA SYRINGE) 25G X 5/8" 3 ML MISC Used to draw monthly B-12 injections.   [DISCONTINUED] ondansetron (ZOFRAN ODT) 4 MG disintegrating tablet Take 1 tablet (4 mg total) by mouth 2 (two) times daily as needed for nausea or vomiting.   No facility-administered encounter medications on file as of 01/30/2023.     Lab Results  Component Value Date   WBC 8.6 09/03/2022   HGB 13.8 09/03/2022   HCT 41.1 09/03/2022   PLT 278.0 09/03/2022   GLUCOSE 108 (H) 12/26/2022   CHOL 159 12/26/2022   TRIG 206.0 (H) 12/26/2022   HDL 48.60 12/26/2022   LDLDIRECT 73.0 12/26/2022   LDLCALC 66 09/03/2022   ALT 22 12/26/2022   AST 21 12/26/2022   NA 141 12/26/2022   K 3.9 12/26/2022   CL 104 12/26/2022   CREATININE 0.66 12/26/2022   BUN 14 12/26/2022   CO2 25 12/26/2022   TSH 1.09 12/19/2021   HGBA1C 5.9 12/26/2022    MM 3D SCREEN BREAST BILATERAL  Result Date: 09/16/2022 CLINICAL DATA:  Screening. EXAM: DIGITAL SCREENING BILATERAL MAMMOGRAM WITH TOMOSYNTHESIS AND CAD TECHNIQUE: Bilateral screening digital craniocaudal and mediolateral oblique mammograms were obtained. Bilateral screening digital breast tomosynthesis was performed. The images were evaluated with computer-aided detection. COMPARISON:  Previous exam(s). ACR Breast Density Category a: The breast tissue is almost entirely fatty. FINDINGS: There  are no findings suspicious for malignancy. IMPRESSION: No mammographic evidence of malignancy. A result letter of this screening mammogram will be mailed directly to the patient. RECOMMENDATION: Screening mammogram in one year. (Code:[id number]-B-01Y) BI-RADS CATEGORY  1: Negative. Electronically Signed   By: Edwin Cap M.D.   On: 09/16/2022 14:29       Assessment & Plan:  Essential hypertension,  benign Assessment & Plan: Continue amlodipine, lisinopril and Aldactone.  Blood pressure as outlined.  Reviewed outside checks. Doing well. Follow pressures.  Follow metabolic panel.  Orders: -     Basic metabolic panel; Future -     TSH; Future  Hypercholesterolemia Assessment & Plan: On Lipitor.  Low-cholesterol diet and exercise.  Follow lipid panel liver function tests.   Lab Results  Component Value Date   CHOL 159 12/26/2022   HDL 48.60 12/26/2022   LDLCALC 66 09/03/2022   LDLDIRECT 73.0 12/26/2022   TRIG 206.0 (H) 12/26/2022   CHOLHDL 3 12/26/2022    Orders: -     Lipid panel; Future -     Hepatic function panel; Future  Hyperglycemia Assessment & Plan: Low-carb diet and exercise.  Follow metb and A1c.  Lab Results  Component Value Date   HGBA1C 5.9 12/26/2022    Orders: -     Hemoglobin A1c; Future  Anemia, unspecified type Assessment & Plan: Follow cbc.    Anxiety Assessment & Plan: Increased stress as outlined.  Discussed.  Continue celexa. No changes.  Discussed counseling.  Will notify me if agreeable for counseling. Follow.    Chest pain, unspecified type Assessment & Plan: Chest pain and sob with exertion as outlined previously.  Episodes also when laughing hard, coughing, cold air, etc. EKG previous visit- SR, non specific ST/T changes.  Have discussed further w/up and evaluation.  Discussed again today. Discussed cardiology referral.  She continues to decline.   Continue risk factor modification.    Gastroesophageal reflux disease, unspecified whether esophagitis present Assessment & Plan: No upper symptoms reported.  On Protonix.   Sleep apnea, unspecified type Assessment & Plan: Continue  cpap.    Increased heart rate Assessment & Plan: Heart rate as outlined.  Most readings 70-90s.  Discussed possible etiologies.  Avoid increased caffeine and other stimulants.  Treat stress.  Continue cpap.       Dale [place], MD

## 2023-01-31 NOTE — Telephone Encounter (Signed)
Discussed with Heather Blake at her appt.

## 2023-02-01 ENCOUNTER — Encounter: Payer: Self-pay | Admitting: Internal Medicine

## 2023-02-01 DIAGNOSIS — R000 Tachycardia, unspecified: Secondary | ICD-10-CM | POA: Insufficient documentation

## 2023-02-01 NOTE — Assessment & Plan Note (Signed)
Increased stress as outlined.  Discussed.  Continue celexa. No changes.  Discussed counseling.  Will notify me if agreeable for counseling. Follow.

## 2023-02-01 NOTE — Assessment & Plan Note (Signed)
Chest pain and sob with exertion as outlined previously.  Episodes also when laughing hard, coughing, cold air, etc. EKG previous visit- SR, non specific ST/T changes.  Have discussed further w/up and evaluation.  Discussed again today. Discussed cardiology referral.  She continues to decline.   Continue risk factor modification.

## 2023-02-01 NOTE — Assessment & Plan Note (Signed)
Low-carb diet and exercise.  Follow metb and A1c.  Lab Results  Component Value Date   HGBA1C 5.9 12/26/2022

## 2023-02-01 NOTE — Assessment & Plan Note (Signed)
On Lipitor.  Low-cholesterol diet and exercise.  Follow lipid panel liver function tests.   Lab Results  Component Value Date   CHOL 159 12/26/2022   HDL 48.60 12/26/2022   LDLCALC 66 09/03/2022   LDLDIRECT 73.0 12/26/2022   TRIG 206.0 (H) 12/26/2022   CHOLHDL 3 12/26/2022

## 2023-02-01 NOTE — Assessment & Plan Note (Signed)
Heart rate as outlined.  Most readings 70-90s.  Discussed possible etiologies.  Avoid increased caffeine and other stimulants.  Treat stress.  Continue cpap.

## 2023-02-01 NOTE — Assessment & Plan Note (Signed)
Follow cbc.  

## 2023-02-01 NOTE — Assessment & Plan Note (Signed)
Continue amlodipine, lisinopril and Aldactone.  Blood pressure as outlined.  Reviewed outside checks. Doing well. Follow pressures.  Follow metabolic panel. 

## 2023-02-01 NOTE — Assessment & Plan Note (Signed)
Continue cpap.  

## 2023-02-01 NOTE — Assessment & Plan Note (Signed)
No upper symptoms reported.  On Protonix. 

## 2023-02-05 ENCOUNTER — Encounter: Payer: Self-pay | Admitting: Internal Medicine

## 2023-02-05 DIAGNOSIS — R000 Tachycardia, unspecified: Secondary | ICD-10-CM

## 2023-02-05 DIAGNOSIS — R059 Cough, unspecified: Secondary | ICD-10-CM

## 2023-02-05 NOTE — Telephone Encounter (Signed)
I have placed the order for cardiology referral to Dr Azucena Cecil.  Does she also want me to place referral to pulmonary - persistent cough.

## 2023-02-05 NOTE — Telephone Encounter (Signed)
Order placed for cardiology referral.   

## 2023-02-05 NOTE — Telephone Encounter (Signed)
I am ok with referral to cardiology - we have discussed this previously with the increased heart rate, etc.  Regarding the "liquid coming up" - need more specifics about this. Is she having trouble swallowing, regurgitating food, new symptoms?   If acute symptoms, needs to be evaluated more urgently.

## 2023-02-05 NOTE — Telephone Encounter (Signed)
Patient says no new symptoms. This has been going on for a while. Regarding the "liquid coming up" this happens if she has a coughing fit and has recently drank something. Reports no trouble swallowing, regurgitating food, etc. Pt is aware to be evaluated ASAP if anything new, worsening or acute.

## 2023-02-06 NOTE — Telephone Encounter (Signed)
Pt is aware the cardiology referral has been placed and she would also like to go forward with the pulmonology referral.

## 2023-02-06 NOTE — Telephone Encounter (Signed)
After reviewing her messages, I don't mind placing another referral, but need to clarify a few things.  She mentioned cough, but then states liquid coming up.  Is she regurgitating food, acid reflux, etc?  May need GI referral instead of pulmonary.

## 2023-02-10 NOTE — Telephone Encounter (Signed)
Not regurgitating food, acid reflux, etc. Pulmonary referral placed to [facility] for cough.

## 2023-02-14 ENCOUNTER — Other Ambulatory Visit: Payer: Self-pay | Admitting: Internal Medicine

## 2023-02-24 ENCOUNTER — Ambulatory Visit (INDEPENDENT_AMBULATORY_CARE_PROVIDER_SITE_OTHER): Payer: Managed Care, Other (non HMO) | Admitting: Internal Medicine

## 2023-02-24 ENCOUNTER — Encounter: Payer: Self-pay | Admitting: Internal Medicine

## 2023-02-24 VITALS — BP 130/78 | HR 88 | Temp 97.9°F | Ht 67.0 in | Wt 269.0 lb

## 2023-02-24 DIAGNOSIS — R053 Chronic cough: Secondary | ICD-10-CM | POA: Diagnosis not present

## 2023-02-24 DIAGNOSIS — R0602 Shortness of breath: Secondary | ICD-10-CM

## 2023-02-24 DIAGNOSIS — G4733 Obstructive sleep apnea (adult) (pediatric): Secondary | ICD-10-CM

## 2023-02-24 NOTE — Progress Notes (Signed)
I received a note from pulmonary that they are recommending a change in her blood pressure medication.  (Changing lisinopril). Please schedule an appt to discuss changing.

## 2023-02-24 NOTE — Progress Notes (Signed)
[facility] Pulmonary Medicine Consultation      Date: 02/24/2023,   MRN# [id number] Heather Blake Jan 13, 1961   CHIEF COMPLAINT:  Chronic cough and shortness of breath    HISTORY OF PRESENT ILLNESS  62 year old pleasant white female weighs approximately 270 pounds seen today for chronic cough and shortness of breath for the last several years  Patient had sleep study in 2019 which showed mild sleep apnea currently on CPAP CPAP download reviewed in detail with patient excellent compliance  Patient had chest x-ray in November 2023 no significant findings Patient has seasonal allergies which she uses Benadryl as needed Patient is a non-smoker nonalcoholic no pets has carpeting she has a lot of stress and anxiety taking care of her terminally ill husband albuterol as needed really helps her cough does not have any triggers  After reviewing her medication list patient is currently on an ACE inhibitor Zestril she has been on this drug for many years  Current weight 269  Ideal body weight should be 157    DX of OSA 2019 AHI 9.6    PAST MEDICAL HISTORY   Past Medical History:  Diagnosis Date   Allergy    Anemia    Cancer (HCC)    Depression    Diverticulosis    FHx: migraine headaches    GERD (gastroesophageal reflux disease)    History of chickenpox    Hypercholesterolemia    Hyperlipidemia    Hypertension    Osteoarthritis    Panic attacks    Sleep apnea     SURGICAL HISTORY   Past Surgical History:  Procedure Laterality Date   ABDOMINAL HYSTERECTOMY     CATARACT EXTRACTION W/PHACO Left 08/11/2017   Procedure: CATARACT EXTRACTION PHACO AND INTRAOCULAR LENS PLACEMENT (IOC);  Surgeon: Galen Manila, MD;  Location: [facility] ORS;  Service: Ophthalmology;  Laterality: Left;  [place] 00:31 AP% 19.2 CDE 6.13 Fluid Pack lot # [id number]   CATARACT EXTRACTION W/PHACO Right 09/01/2017   Procedure: CATARACT EXTRACTION PHACO AND INTRAOCULAR LENS PLACEMENT (IOC);  Surgeon:  Galen Manila, MD;  Location: [facility];  Service: Ophthalmology;  Laterality: Right;  [place] 00:30 AP% 11.7 CDE3.52 fluid pack lot #[id number]   Child Birth  1981 and 1979   COLONOSCOPY     COLONOSCOPY WITH PROPOFOL N/A 08/25/2018   Procedure: COLONOSCOPY WITH PROPOFOL;  Surgeon: Toledo, Boykin Nearing, MD;  Location: [facility];  Service: Gastroenterology;  Laterality: N/A;   ESOPHAGOGASTRODUODENOSCOPY     ESOPHAGOGASTRODUODENOSCOPY (EGD) WITH PROPOFOL N/A 08/25/2018   Procedure: ESOPHAGOGASTRODUODENOSCOPY (EGD) WITH PROPOFOL;  Surgeon: Toledo, Boykin Nearing, MD;  Location: [facility];  Service: Gastroenterology;  Laterality: N/A;   FRACTURE SURGERY     LAPAROSCOPIC SUPRACERVICAL HYSTERECTOMY  2006   ovaries left in place   left elbow     left knee     Miscarriage  1987   TONSILLECTOMY  1963   TUBAL LIGATION  1993     FAMILY HISTORY   Family History  Problem Relation Age of Onset   Heart disease Mother        Incomplete heart block   Hypertension Mother    Diabetes Mother    Nephrolithiasis Mother    Migraines Mother    Arthritis Mother        degenerative-back,osteoporosis   Heart failure Mother    Heart disease Father 55       Myocardial infarction   Heart failure Father    Heart disease Sister  h/o MI   Migraines Sister    Breast cancer Neg Hx      SOCIAL HISTORY   Social History   Tobacco Use   Smoking status: Former    Packs/day: 0.10    Years: 1.50    Additional pack years: 0.00    Total pack years: 0.15    Types: Cigarettes   Smokeless tobacco: Never  Vaping Use   Vaping Use: Never used  Substance Use Topics   Alcohol use: No   Drug use: No     MEDICATIONS    Home Medication:  Current Outpatient Rx   Order #: [id number] Class: Historical Med   Order #: [id number] Class: Normal   Order #: [id number] Class: Normal   Order #: [id number] Class: Normal   Order #: [id number] Class: Normal   Order #: [id number] Class: Normal   Order #:  [id number] Class: Normal   Order #: [id number] Class: Normal   Order #: [id number] Class: Normal   Order #: [id number] Class: Normal   Order #: [id number] Class: Normal   Order #: [id number] Class: Normal   Order #: [id number] Class: Historical Med   Order #: [id number] Class: Normal   Order #: [id number] Class: Normal   Order #: [id number] Class: Normal    Current Medication:  Current Outpatient Medications:    acetaminophen (TYLENOL) 650 MG CR tablet, Take 650 mg by mouth every 8 (eight) hours as needed for pain., Disp: , Rfl:    albuterol (VENTOLIN HFA) 108 (90 Base) MCG/ACT inhaler, Inhale 2 puffs into the lungs every 6 (six) hours as needed for wheezing or shortness of breath., Disp: 8 g, Rfl: 0   amLODipine (NORVASC) 10 MG tablet, Take 1 tablet (10 mg total) by mouth daily., Disp: 90 tablet, Rfl: 1   atorvastatin (LIPITOR) 40 MG tablet, Take 1 tablet (40 mg total) by mouth daily., Disp: 90 tablet, Rfl: 1   Blood Glucose Monitoring Suppl (CONTOUR NEXT MONITOR) w/Device KIT, 1 kit by Does not apply route 2 (two) times daily. DX: E11.9, Disp: 1 kit, Rfl: 0   busPIRone (BUSPAR) 5 MG tablet, Take 1 tablet (5 mg total) by mouth 2 (two) times daily. TAKE 1 TABLET(5 MG) BY MOUTH DAILY AS NEEDED, Disp: 180 tablet, Rfl: 1   citalopram (CELEXA) 40 MG tablet, Take 1 tablet by mouth once daily, Disp: 90 tablet, Rfl: 0   cyanocobalamin (VITAMIN B12) 1000 MCG/ML injection, Inject 1 mL (1,000 mcg total) into the muscle every 30 (thirty) days., Disp: 3 mL, Rfl: 0   glucose blood (CONTOUR NEXT TEST) test strip, USE 1 STRIP TO CHECK GLUCOSE TWICE DAILY, Disp: 100 each, Rfl: 0   lisinopril (ZESTRIL) 40 MG tablet, Take 1 tablet (40 mg total) by mouth daily., Disp: 90 tablet, Rfl: 1   Microlet Lancets MISC, USE 1  TO CHECK GLUCOSE TWICE DAILY, Disp: 100 each, Rfl: 0   NEEDLE, DISP, 25 G (B-D DISP NEEDLE 25GX1") 25G X 1" MISC, Used to give monthly B-12 injections, Disp: 50 each, Rfl: 0   ondansetron (ZOFRAN) 4 MG tablet, Take  4 mg by mouth every 8 (eight) hours as needed for nausea or vomiting., Disp: , Rfl:    pantoprazole (PROTONIX) 40 MG tablet, Take 1 tablet by mouth once daily, Disp: 90 tablet, Rfl: 0   spironolactone (ALDACTONE) 25 MG tablet, Take 1 tablet by mouth once daily, Disp: 90 tablet, Rfl: 2   SYRINGE-NEEDLE, DISP, 3 ML (B-D INTEGRA SYRINGE) 25G X 5/8" 3 ML MISC, Used to draw monthly B-12 injections., Disp: 50 each, Rfl: 0  ALLERGIES   Aspartame, Celebrex [celecoxib], Imitrex [sumatriptan], Keflex [cephalexin], Nsaids, Paxil [paroxetine hcl], Percocet [oxycodone-acetaminophen], Rofecoxib, Sulfa antibiotics, and Tylenol [acetaminophen]     REVIEW OF SYSTEMS    Review of Systems:  Gen:  Denies  fever, sweats, chills weigh loss  HEENT: Denies blurred vision, double vision, ear pain, eye pain, hearing loss, nose bleeds, sore throat Cardiac:  No dizziness, chest pain or heaviness, chest tightness,edema Resp:   See HPI Gi: Denies swallowing difficulty, stomach pain, nausea or vomiting, diarrhea, constipation, bowel incontinence Gu:  Denies bladder incontinence, burning urine Ext:   Denies Joint pain, stiffness or swelling Skin: Denies  skin rash, easy bruising or bleeding or hives Endoc:  Denies polyuria, polydipsia , polyphagia or weight change Psych:   Denies depression, insomnia or hallucinations   Other:  All other systems negative  BP 130/78 (BP Location: Left Arm, Cuff Size: Normal)   Pulse 88   Temp 97.9 F (36.6 C) (Temporal)   Ht 5\' 7"  (1.702 m)   Wt 269 lb (122 kg)   SpO2 97%   BMI 42.13 kg/m      PHYSICAL EXAM  General Appearance: No distress  EYES PERRLA, EOM intact.   NECK Supple, No JVD Pulmonary: normal breath sounds, No wheezing.  CardiovascularNormal S1,S2.  No m/r/g.   Abdomen: Benign, Soft, non-tender. Skin:   warm, no rashes, no ecchymosis  Extremities: normal, no cyanosis, clubbing. Neuro:without focal findings,  speech normal  PSYCHIATRIC: Mood,  affect within normal limits.   ALL OTHER ROS ARE NEGATIVE      IMAGING   CXR 08/2022 No acute process    ASSESSMENT/PLAN    62 year old morbidly obese white female with underlying sleep apnea with a history of chronic cough for many years along with some shortness of breath likely related to her obesity and deconditioned state  At this time her chronic cough could be multifactorial especially in the setting of ACE inhibitor usage with seasonal allergies along with some reflux STRONGLY recommend stopping her ACE inhibitor and its alternative therapies I have advised patient to consult and contact her primary care physician office to find an alternative  At this time I would recommend pulmonary function test overnight pulse oximetry and 6-minute walk test along with chest x-ray however will start with discontinuation of ACE inhibitor at this time before we pursue further testing  Continue CPAP as prescribed Patient has excellent compliance Patient use and benefits from therapy AHI significantly reduced  Obesity -recommend significant weight loss -recommend changing diet  Deconditioned state -Recommend increased daily activity and exercise    CURRENT MEDICATIONS REVIEWED AT LENGTH WITH PATIENT TODAY   Patient  satisfied with Plan of action and management. All questions answered  Follow up 3 months  Total Time Spent  45 mins   Wallis Bamberg Santiago Glad, M.D.  [facility]  Medical Director [facility] Medical Director [facility] Cardio-Pulmonary Department

## 2023-02-24 NOTE — Patient Instructions (Signed)
PLEASE STOP ZESTRIL AND OTHER MEDS RELATED TO THIS DRUG-CAUSES COUGH PLEASE CALL PCP ABOUT ALTERNATIVE MEDS FOR HIGH BLOOD PRESSURE  CONTINUE MEDS AS NEEDED TO CONTROL ALLERGIES CONTINUE MEDS TO CONTROL REFLUX  EXCELLENT JOB WITH CPAP A+  RECOMMEND WEIGHT LOSS

## 2023-02-25 ENCOUNTER — Encounter: Payer: Self-pay | Admitting: Internal Medicine

## 2023-03-10 IMAGING — MR MR ANKLE*R* W/O CM
5 series · 40 of 40 positions shown · non-contrast
Comparison: Radiographs 02/08/2021

CLINICAL DATA: Patient fell in October 2020. Persistent pain and
swelling.

EXAM:
MRI OF THE RIGHT ANKLE WITHOUT CONTRAST
TECHNIQUE: Multiplanar, multisequence MR imaging of the ankle was performed. No
intravenous contrast was administered.

[Series 3: PD fat-sat · axial · 3.0mm · 0.70mm/px · z∈[-105,+41]mm · 9 of 45 slices shown]
[im 1/45]
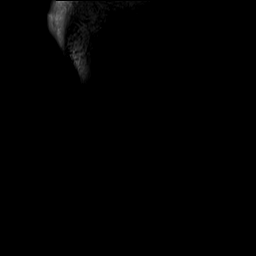
[im 6/45]
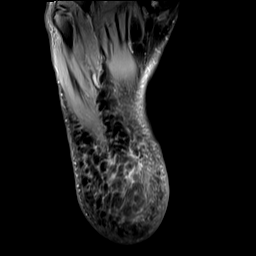
[im 12/45]
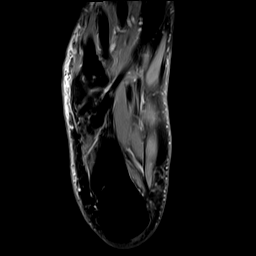
[im 17/45]
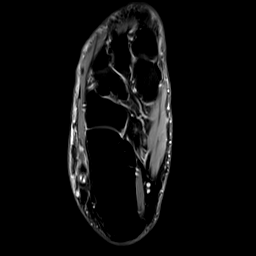
[im 23/45]
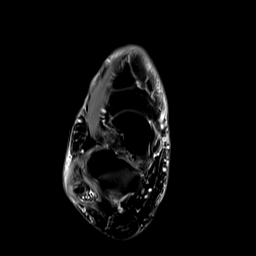
[im 28/45]
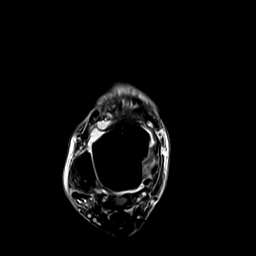
[im 34/45]
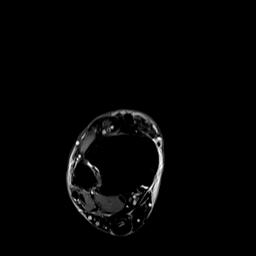
[im 39/45]
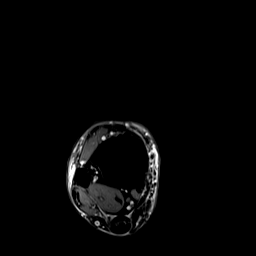
[im 45/45]
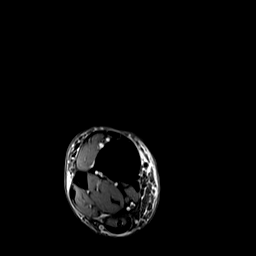

[Series 4: T2 fat-sat · axial · 3.0mm · 0.70mm/px · z∈[-105,+41]mm · 9 of 45 slices shown (1 of 2)]
[im 1/45]
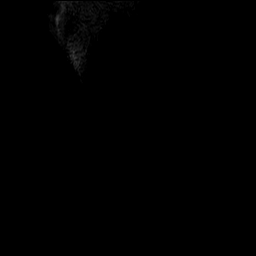
[im 6/45]
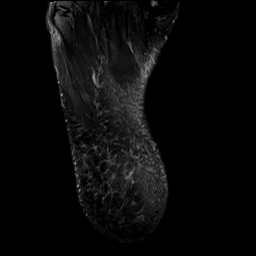
[im 12/45]
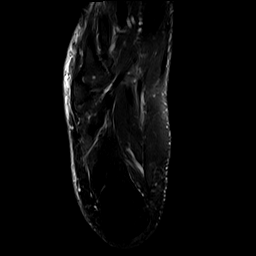
[im 17/45]
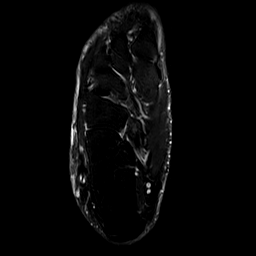
[im 23/45]
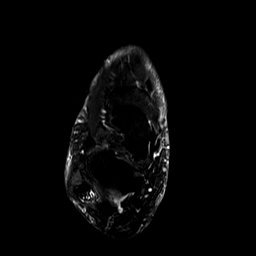
[im 28/45]
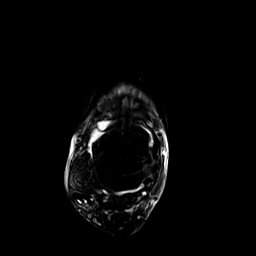
[im 34/45]
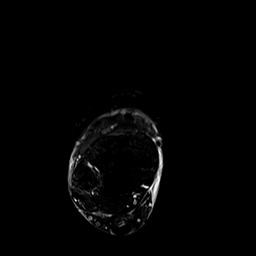
[im 39/45]
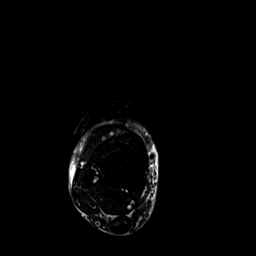
[im 45/45]
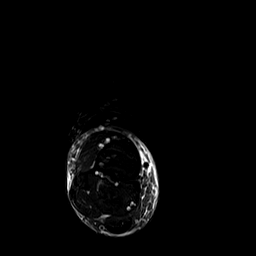

[Series 6: T1 · sagittal · 3.0mm · 0.56mm/px · 6 of 27 slices shown]
[im 1/27]
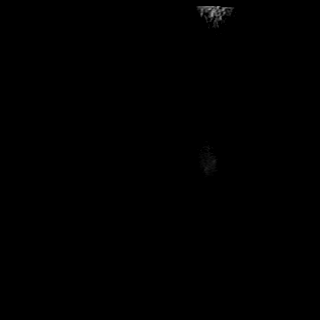
[im 6/27]
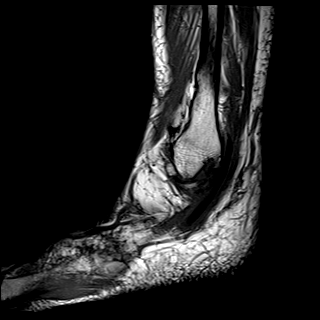
[im 11/27]
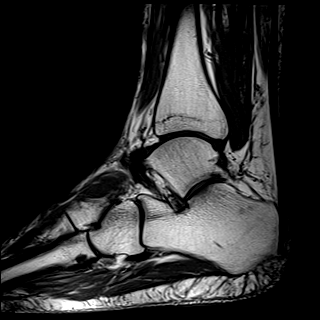
[im 16/27]
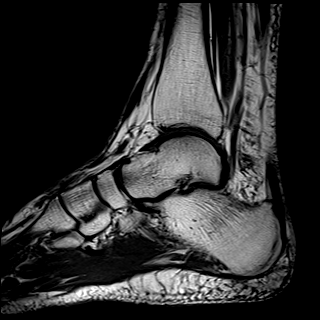
[im 21/27]
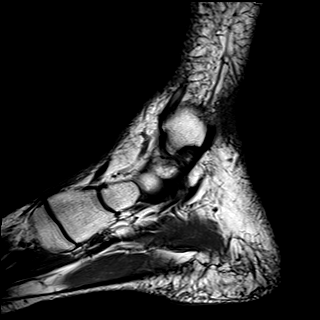
[im 27/27]
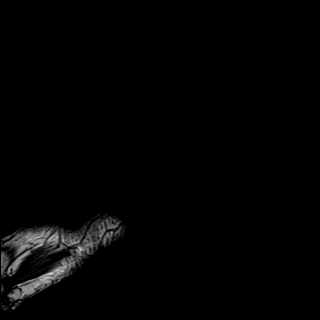

[Series 7: STIR · sagittal · 3.0mm · 0.70mm/px · 6 of 27 slices shown]
[im 1/27]
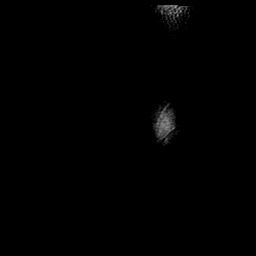
[im 6/27]
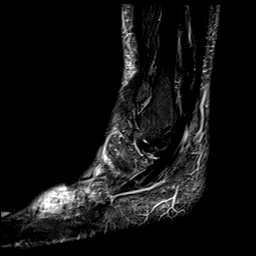
[im 11/27]
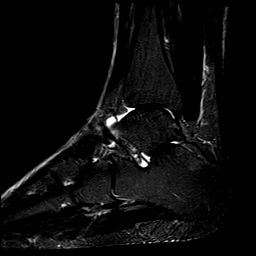
[im 16/27]
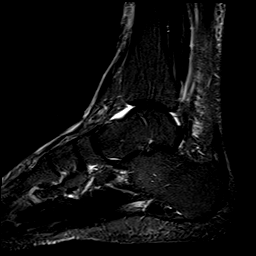
[im 21/27]
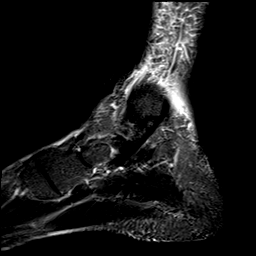
[im 27/27]
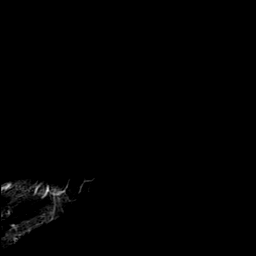

[Series 8: T2 fat-sat · coronal · 3.0mm · 0.70mm/px · 10 of 45 slices shown (2 of 2)]
[im 1/45]
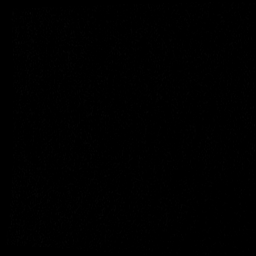
[im 5/45]
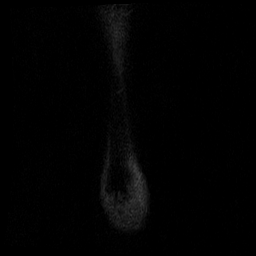
[im 10/45]
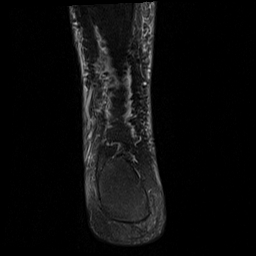
[im 15/45]
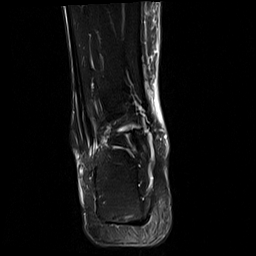
[im 20/45]
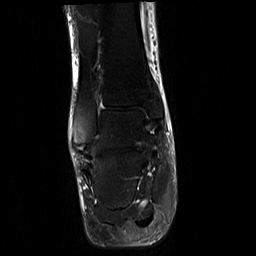
[im 25/45]
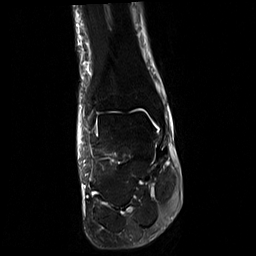
[im 30/45]
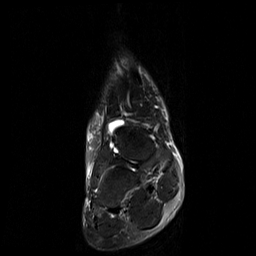
[im 35/45]
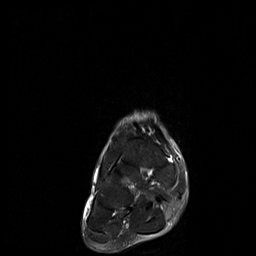
[im 40/45]
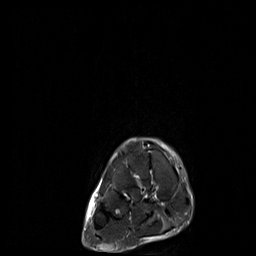
[im 45/45]
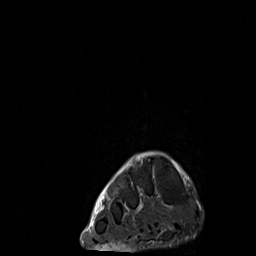

[40 of 40 positions shown; findings below may reference images not displayed]

FINDINGS: TENDONS

Peroneal: Intact

Posteromedial: Intact

Anterior: Intact

Achilles: Normal

Plantar Fascia: Intact.  No findings for plantar fasciitis.

LIGAMENTS

Lateral: The anterior and posterior tibiofibular ligaments are
intact. I do not see anterior talofibular ligament and I suspect is
chronically torn. There is a rounded area of dark PD and T2 signal
intensity in the anterolateral gutter. This discoid lesion can be
seen with chronic ligamentous injury and can cause anterolateral
impingement. The posterior talofibular ligament is intact. The
calcaneal fibular ligament appears intact.

Medial: Intact

CARTILAGE

Ankle Joint: Moderate degenerative changes with degenerative
chondrosis, joint space narrowing, early spurring and small joint
effusion. No osteochondral lesion.

Subtalar Joints/Sinus Tarsi: Moderate subtalar joint degenerative
changes. There is an effusion in the posterior talocalcaneal facet.
The sinus tarsi is unremarkable.

Bones: No stress fracture or AVN. No bone lesions. Mild midfoot
degenerative changes are noted.

Other: Unremarkable foot and ankle musculature.
IMPRESSION: 1. Intact medial, lateral and anterior ankle tendons.
2. Suspect chronically torn anterior talofibular ligament. There is
a discoid lesion in the lateral gutter (fibrous tissue) which can
cause anterolateral impingement.
3. Moderate tibiotalar and subtalar joint degenerative changes.
4. No stress fracture or AVN.

## 2023-03-13 ENCOUNTER — Encounter: Payer: Self-pay | Admitting: Internal Medicine

## 2023-03-13 DIAGNOSIS — L989 Disorder of the skin and subcutaneous tissue, unspecified: Secondary | ICD-10-CM

## 2023-03-14 ENCOUNTER — Other Ambulatory Visit: Payer: Self-pay | Admitting: Internal Medicine

## 2023-03-14 DIAGNOSIS — E538 Deficiency of other specified B group vitamins: Secondary | ICD-10-CM

## 2023-03-16 NOTE — Telephone Encounter (Signed)
Order placed for dermatology referral.  Let us know if any acute change.

## 2023-03-16 NOTE — Telephone Encounter (Signed)
I have pended a dermatology referral. Wanted you to see the pictures.

## 2023-03-17 ENCOUNTER — Ambulatory Visit: Payer: Managed Care, Other (non HMO) | Admitting: Internal Medicine

## 2023-03-20 ENCOUNTER — Encounter: Payer: Self-pay | Admitting: Internal Medicine

## 2023-03-20 ENCOUNTER — Other Ambulatory Visit: Payer: Self-pay | Admitting: Internal Medicine

## 2023-03-20 ENCOUNTER — Ambulatory Visit (INDEPENDENT_AMBULATORY_CARE_PROVIDER_SITE_OTHER): Payer: Managed Care, Other (non HMO) | Admitting: Internal Medicine

## 2023-03-20 VITALS — BP 128/78 | HR 90 | Temp 97.9°F | Resp 16 | Ht 67.0 in | Wt 271.0 lb

## 2023-03-20 DIAGNOSIS — K219 Gastro-esophageal reflux disease without esophagitis: Secondary | ICD-10-CM

## 2023-03-20 DIAGNOSIS — R55 Syncope and collapse: Secondary | ICD-10-CM

## 2023-03-20 DIAGNOSIS — R059 Cough, unspecified: Secondary | ICD-10-CM

## 2023-03-20 DIAGNOSIS — F419 Anxiety disorder, unspecified: Secondary | ICD-10-CM

## 2023-03-20 DIAGNOSIS — I10 Essential (primary) hypertension: Secondary | ICD-10-CM

## 2023-03-20 DIAGNOSIS — E7800 Pure hypercholesterolemia, unspecified: Secondary | ICD-10-CM

## 2023-03-20 DIAGNOSIS — G4730 Sleep apnea, unspecified: Secondary | ICD-10-CM

## 2023-03-20 DIAGNOSIS — R739 Hyperglycemia, unspecified: Secondary | ICD-10-CM

## 2023-03-20 MED ORDER — ATORVASTATIN CALCIUM 40 MG PO TABS: 40.0000 mg | ORAL_TABLET | Freq: Every day | ORAL | 3 refills | Status: DC

## 2023-03-20 MED ORDER — CITALOPRAM HYDROBROMIDE 40 MG PO TABS: 40.0000 mg | ORAL_TABLET | Freq: Every day | ORAL | 1 refills | Status: DC

## 2023-03-20 MED ORDER — BUSPIRONE HCL 5 MG PO TABS: 5.0000 mg | ORAL_TABLET | Freq: Two times a day (BID) | ORAL | 1 refills | Status: DC

## 2023-03-20 MED ORDER — PANTOPRAZOLE SODIUM 40 MG PO TBEC: 40.0000 mg | DELAYED_RELEASE_TABLET | Freq: Every day | ORAL | 3 refills | Status: DC

## 2023-03-20 NOTE — Progress Notes (Unsigned)
Subjective:    [name], female    DOB: 1961/08/26, 62 y.o.   MRN: [id number]  Patient here for  Chief Complaint  Patient presents with   Medical Management of Chronic Issues    HPI Here as a work in appt to discuss her blood pressure medication.  Saw Dr Belia Heman.  Concern that zestril may be contributing her chronic cough.  (Also recommended PFTs, overnight pulse oximetry and 6 minute walk test, but wanted to start first with changing blood pressure medication). She stopped her blood pressure medication 02/21/23.  Feels cough is better.  Symptoms have not completely resolved, but have improved.  Reviewed outside blood pressures.  Blood pressures averaging 114-120s/60-70s without the medication.  She does have a cardiology appt scheduled.  Has had episodes where she feels like she is going to pass out.  Can occur sitting or standing.  Intermittent.  No specific trigger.  No actual syncope.  Did have an episode in [employer] while getting groceries where she became shaky and a little confused.  She had not eaten that day.  Ate some candy and symptoms improved.  This was different then her episodes described above.  Discussed importance of eating regular meals and not going long periods without eating.  Discussed importance of eating protein.  Also had an episode recently - pain right ring finger.  Pain when up her arm to her shoulder.  No neck pain.  Unable to grip - due to pain.  No weakness.  Could mover her arm.  Resolved and has not returned.  Desires no further w/up at this time.  Increased stress.  Overall she feels she is handling things ok.    Past Medical History:  Diagnosis Date   Allergy    Anemia    Cancer (HCC)    Depression    Diverticulosis    FHx: migraine headaches    GERD (gastroesophageal reflux disease)    History of chickenpox    Hypercholesterolemia    Hyperlipidemia    Hypertension    Osteoarthritis    Panic attacks    Sleep apnea    Past Surgical  History:  Procedure Laterality Date   ABDOMINAL HYSTERECTOMY     CATARACT EXTRACTION W/PHACO Left 08/11/2017   Procedure: CATARACT EXTRACTION PHACO AND INTRAOCULAR LENS PLACEMENT (IOC);  Surgeon: Galen Manila, MD;  Location: [facility];  Service: Ophthalmology;  Laterality: Left;  [place] 00:31 AP% 19.2 CDE 6.13 Fluid Pack lot # [id number]   CATARACT EXTRACTION W/PHACO Right 09/01/2017   Procedure: CATARACT EXTRACTION PHACO AND INTRAOCULAR LENS PLACEMENT (IOC);  Surgeon: Galen Manila, MD;  Location: [facility];  Service: Ophthalmology;  Laterality: Right;  [place] 00:30 AP% 11.7 CDE3.52 fluid pack lot #[id number]   Child Birth  1981 and 1979   COLONOSCOPY     COLONOSCOPY WITH PROPOFOL N/A 08/25/2018   Procedure: COLONOSCOPY WITH PROPOFOL;  Surgeon: Toledo, Boykin Nearing, MD;  Location: [facility];  Service: Gastroenterology;  Laterality: N/A;   ESOPHAGOGASTRODUODENOSCOPY     ESOPHAGOGASTRODUODENOSCOPY (EGD) WITH PROPOFOL N/A 08/25/2018   Procedure: ESOPHAGOGASTRODUODENOSCOPY (EGD) WITH PROPOFOL;  Surgeon: Toledo, Boykin Nearing, MD;  Location: [facility];  Service: Gastroenterology;  Laterality: N/A;   FRACTURE SURGERY     LAPAROSCOPIC SUPRACERVICAL HYSTERECTOMY  2006   ovaries left in place   left elbow     left knee     Miscarriage  1987   TONSILLECTOMY  1963   TUBAL LIGATION  1993   Family History  Problem Relation Age of Onset   Heart disease Mother        Incomplete heart block   Hypertension Mother    Diabetes Mother    Nephrolithiasis Mother    Migraines Mother    Arthritis Mother        degenerative-back,osteoporosis   Heart failure Mother    Heart disease Father 64       Myocardial infarction   Heart failure Father    Heart disease Sister        h/o MI   Migraines Sister    Breast cancer Neg Hx    Social History   Socioeconomic History   Marital status: Married    Spouse name: Not on file   Number of children: Not on file   Years of education: Not on file    Highest education level: Not on file  Occupational History   Not on file  Tobacco Use   Smoking status: Former    Packs/day: 0.10    Years: 1.50    Additional pack years: 0.00    Total pack years: 0.15    Types: Cigarettes    Quit date: 20    Years since quitting: 27.4   Smokeless tobacco: Never  Vaping Use   Vaping Use: Never used  Substance and Sexual Activity   Alcohol use: No   Drug use: No   Sexual activity: Not on file  Other Topics Concern   Not on file  Social History Narrative   Not on file   Social Determinants of Health   Financial Resource Strain: Not on file  Food Insecurity: Not on file  Transportation Needs: Not on file  Physical Activity: Not on file  Stress: Not on file  Social Connections: Not on file     Review of Systems  Constitutional:  Negative for appetite change and unexpected weight change.  HENT:  Negative for congestion and sinus pressure.   Respiratory:  Negative for cough and chest tightness.        Breathing overall stable.   Cardiovascular:  Negative for chest pain, palpitations and leg swelling.       Episodes of feeling like she is going to black out as outlined.   Gastrointestinal:  Negative for abdominal pain, diarrhea, nausea and vomiting.  Genitourinary:  Negative for difficulty urinating and dysuria.  Musculoskeletal:  Negative for joint swelling and myalgias.  Skin:  Negative for color change and rash.  Neurological:  Negative for dizziness and headaches.  Psychiatric/Behavioral:  Negative for agitation and dysphoric mood.        Increased stress.        Objective:     BP 128/78   Pulse 90   Temp 97.9 F (36.6 C)   Resp 16   Ht 5\' 7"  (1.702 m)   Wt 271 lb (122.9 kg)   SpO2 98%   BMI 42.44 kg/m  Wt Readings from Last 3 Encounters:  03/20/23 271 lb (122.9 kg)  02/24/23 269 lb (122 kg)  01/30/23 269 lb 2 oz (122.1 kg)    Physical Exam Vitals reviewed.  Constitutional:      General: She is not in acute  distress.    Appearance: Normal appearance.  HENT:     Head: Normocephalic and atraumatic.     Right Ear: External ear normal.     Left Ear: External ear normal.  Eyes:     General: No scleral icterus.       Right eye:  No discharge.        Left eye: No discharge.     Conjunctiva/sclera: Conjunctivae normal.  Neck:     Thyroid: No thyromegaly.  Cardiovascular:     Rate and Rhythm: Normal rate and regular rhythm.  Pulmonary:     Effort: No respiratory distress.     Breath sounds: Normal breath sounds. No wheezing.  Abdominal:     General: Bowel sounds are normal.     Palpations: Abdomen is soft.     Tenderness: There is no abdominal tenderness.  Musculoskeletal:        General: No swelling or tenderness.     Cervical back: Neck supple. No tenderness.  Lymphadenopathy:     Cervical: No cervical adenopathy.  Skin:    Findings: No erythema or rash.  Neurological:     Mental Status: She is alert.  Psychiatric:        Mood and Affect: Mood normal.        Behavior: Behavior normal.      Outpatient Encounter Medications as of 03/20/2023  Medication Sig   acetaminophen (TYLENOL) 650 MG CR tablet Take 650 mg by mouth every 8 (eight) hours as needed for pain.   albuterol (VENTOLIN HFA) 108 (90 Base) MCG/ACT inhaler Inhale 2 puffs into the lungs every 6 (six) hours as needed for wheezing or shortness of breath.   amLODipine (NORVASC) 10 MG tablet Take 1 tablet (10 mg total) by mouth daily.   atorvastatin (LIPITOR) 40 MG tablet Take 1 tablet (40 mg total) by mouth daily.   Blood Glucose Monitoring Suppl (CONTOUR NEXT MONITOR) w/Device KIT 1 kit by Does not apply route 2 (two) times daily. DX: E11.9   citalopram (CELEXA) 40 MG tablet Take 1 tablet (40 mg total) by mouth daily.   cyanocobalamin (VITAMIN B12) 1000 MCG/ML injection INJECT 1 ML (CC) INTRAMUSCULARLY ONCE EVERY MONTH   glucose blood (CONTOUR NEXT TEST) test strip USE 1 STRIP TO CHECK GLUCOSE TWICE DAILY   Microlet Lancets  MISC USE 1  TO CHECK GLUCOSE TWICE DAILY   NEEDLE, DISP, 25 G (B-D DISP NEEDLE 25GX1") 25G X 1" MISC Used to give monthly B-12 injections   ondansetron (ZOFRAN) 4 MG tablet Take 4 mg by mouth every 8 (eight) hours as needed for nausea or vomiting.   pantoprazole (PROTONIX) 40 MG tablet Take 1 tablet (40 mg total) by mouth daily.   spironolactone (ALDACTONE) 25 MG tablet Take 1 tablet by mouth once daily   SYRINGE-NEEDLE, DISP, 3 ML (B-D INTEGRA SYRINGE) 25G X 5/8" 3 ML MISC Used to draw monthly B-12 injections.   [DISCONTINUED] atorvastatin (LIPITOR) 40 MG tablet Take 1 tablet (40 mg total) by mouth daily.   [DISCONTINUED] busPIRone (BUSPAR) 5 MG tablet Take 1 tablet (5 mg total) by mouth 2 (two) times daily. TAKE 1 TABLET(5 MG) BY MOUTH DAILY AS NEEDED   [DISCONTINUED] busPIRone (BUSPAR) 5 MG tablet Take 1 tablet (5 mg total) by mouth 2 (two) times daily. TAKE 1 TABLET(5 MG) BY MOUTH DAILY AS NEEDED   [DISCONTINUED] citalopram (CELEXA) 40 MG tablet Take 1 tablet by mouth once daily   [DISCONTINUED] lisinopril (ZESTRIL) 40 MG tablet Take 1 tablet (40 mg total) by mouth daily.   [DISCONTINUED] pantoprazole (PROTONIX) 40 MG tablet Take 1 tablet by mouth once daily   No facility-administered encounter medications on file as of 03/20/2023.     Lab Results  Component Value Date   WBC 8.6 09/03/2022   HGB 13.8 09/03/2022  HCT 41.1 09/03/2022   PLT 278.0 09/03/2022   GLUCOSE 108 (H) 12/26/2022   CHOL 159 12/26/2022   TRIG 206.0 (H) 12/26/2022   HDL 48.60 12/26/2022   LDLDIRECT 73.0 12/26/2022   LDLCALC 66 09/03/2022   ALT 22 12/26/2022   AST 21 12/26/2022   NA 141 12/26/2022   K 3.9 12/26/2022   CL 104 12/26/2022   CREATININE 0.66 12/26/2022   BUN 14 12/26/2022   CO2 25 12/26/2022   TSH 1.09 12/19/2021   HGBA1C 5.9 12/26/2022    MM 3D SCREEN BREAST BILATERAL  Result Date: 09/16/2022 CLINICAL DATA:  Screening. EXAM: DIGITAL SCREENING BILATERAL MAMMOGRAM WITH TOMOSYNTHESIS AND CAD  TECHNIQUE: Bilateral screening digital craniocaudal and mediolateral oblique mammograms were obtained. Bilateral screening digital breast tomosynthesis was performed. The images were evaluated with computer-aided detection. COMPARISON:  Previous exam(s). ACR Breast Density Category a: The breast tissue is almost entirely fatty. FINDINGS: There are no findings suspicious for malignancy. IMPRESSION: No mammographic evidence of malignancy. A result letter of this screening mammogram will be mailed directly to the patient. RECOMMENDATION: Screening mammogram in one year. (Code:[id number]-B-01Y) BI-RADS CATEGORY  1: Negative. Electronically Signed   By: Edwin Cap M.D.   On: 09/16/2022 14:29       Assessment & Plan:  Essential hypertension, benign Assessment & Plan: Blood pressure doing well off lisinopril.  Remain off.  Continue aldactone.  Follow pressures.  Follow metabolic panel.  Cough is some better.    Cough, unspecified type Assessment & Plan: Saw Dr Belia Heman.  Concern that zestril may be contributing her chronic cough.  (Also recommended PFTs, overnight pulse oximetry and 6 minute walk test, but wanted to start first with changing blood pressure medication). She stopped her blood pressure medication 02/21/23.  Feels cough is better.  Symptoms have not completely resolved, but have improved.    Anxiety Assessment & Plan: Increased stress as outlined.  Discussed.  Continue celexa. No changes.  Have discussed counseling.  Follow.    Gastroesophageal reflux disease, unspecified whether esophagitis present Assessment & Plan: No upper symptoms reported.  On Protonix.   Hypercholesterolemia Assessment & Plan: On Lipitor.  Low-cholesterol diet and exercise.  Follow lipid panel liver function tests.   Lab Results  Component Value Date   CHOL 159 12/26/2022   HDL 48.60 12/26/2022   LDLCALC 66 09/03/2022   LDLDIRECT 73.0 12/26/2022   TRIG 206.0 (H) 12/26/2022   CHOLHDL 3 12/26/2022      Hyperglycemia Assessment & Plan: Have previously discussed low-carb diet and exercise.  Recent episode - probable hypoglycemia. She had not eaten that day.  Improved with eating.  Discussed importance of eating regular meals.  Discussed protein. Follow metb and A1c.  Lab Results  Component Value Date   HGBA1C 5.9 12/26/2022     Sleep apnea, unspecified type Assessment & Plan: Continue  cpap.    Near syncope Assessment & Plan: See previous note for details.   Reviewed outside blood pressures.  Blood pressures averaging 114-120s/60-70s without the medication.  She does have a cardiology appt scheduled.  Has had episodes where she feels like she is going to pass out.  Can occur sitting or standing.  Intermittent.  No specific trigger.  No actual syncope. Not orthostatic on exam.  Different from the episode in [employer] - felt to be related to hypoglycemia.  Will check sugars and blood pressure if occurs again. Discussed need for further evaluation and question of need for monitor, echo, etc.  Can sense  when coming on.  Discussed not driving if any symptoms.    Other orders -     Atorvastatin Calcium; Take 1 tablet (40 mg total) by mouth daily.  Dispense: 90 tablet; Refill: 3 -     Citalopram Hydrobromide; Take 1 tablet (40 mg total) by mouth daily.  Dispense: 90 tablet; Refill: 1 -     Pantoprazole Sodium; Take 1 tablet (40 mg total) by mouth daily.  Dispense: 90 tablet; Refill: 3     Dale Bluefield, MD

## 2023-03-20 NOTE — Assessment & Plan Note (Signed)
Physical today 05/07/22.  Mammogram 09/10/21 - Birads I.  Colonoscopy 08/2018 - internal hemorrhoids.  F/u in 10 years.   

## 2023-03-20 NOTE — Telephone Encounter (Signed)
Confirmed with patient. She is using BID prn. Was increased when her husband got sick. New rx sent in

## 2023-03-20 NOTE — Telephone Encounter (Signed)
Printed for appt. 

## 2023-03-21 ENCOUNTER — Encounter: Payer: Self-pay | Admitting: Internal Medicine

## 2023-03-21 DIAGNOSIS — R55 Syncope and collapse: Secondary | ICD-10-CM | POA: Insufficient documentation

## 2023-03-21 NOTE — Assessment & Plan Note (Signed)
Increased stress as outlined.  Discussed.  Continue celexa. No changes.  Have discussed counseling.  Follow.

## 2023-03-21 NOTE — Assessment & Plan Note (Signed)
See previous note for details.   Reviewed outside blood pressures.  Blood pressures averaging 114-120s/60-70s without the medication.  She does have a cardiology appt scheduled.  Has had episodes where she feels like she is going to pass out.  Can occur sitting or standing.  Intermittent.  No specific trigger.  No actual syncope. Not orthostatic on exam.  Different from the episode in [employer] - felt to be related to hypoglycemia.  Will check sugars and blood pressure if occurs again. Discussed need for further evaluation and question of need for monitor, echo, etc.  Can sense when coming on.  Discussed not driving if any symptoms.

## 2023-03-21 NOTE — Assessment & Plan Note (Signed)
On Lipitor.  Low-cholesterol diet and exercise.  Follow lipid panel liver function tests.   Lab Results  Component Value Date   CHOL 159 12/26/2022   HDL 48.60 12/26/2022   LDLCALC 66 09/03/2022   LDLDIRECT 73.0 12/26/2022   TRIG 206.0 (H) 12/26/2022   CHOLHDL 3 12/26/2022   

## 2023-03-21 NOTE — Assessment & Plan Note (Signed)
No upper symptoms reported.  On Protonix. 

## 2023-03-21 NOTE — Assessment & Plan Note (Signed)
Saw Dr Belia Heman.  Concern that zestril may be contributing her chronic cough.  (Also recommended PFTs, overnight pulse oximetry and 6 minute walk test, but wanted to start first with changing blood pressure medication). She stopped her blood pressure medication 02/21/23.  Feels cough is better.  Symptoms have not completely resolved, but have improved.

## 2023-03-21 NOTE — Assessment & Plan Note (Signed)
Blood pressure doing well off lisinopril.  Remain off.  Continue aldactone.  Follow pressures.  Follow metabolic panel.  Cough is some better.

## 2023-03-21 NOTE — Assessment & Plan Note (Addendum)
Have previously discussed low-carb diet and exercise.  Recent episode - probable hypoglycemia. She had not eaten that day.  Improved with eating.  Discussed importance of eating regular meals.  Discussed protein. Follow metb and A1c.  Lab Results  Component Value Date   HGBA1C 5.9 12/26/2022

## 2023-03-21 NOTE — Assessment & Plan Note (Signed)
Continue cpap.  

## 2023-03-23 ENCOUNTER — Encounter: Payer: Self-pay | Admitting: Internal Medicine

## 2023-03-23 DIAGNOSIS — L989 Disorder of the skin and subcutaneous tissue, unspecified: Secondary | ICD-10-CM

## 2023-04-03 NOTE — Telephone Encounter (Signed)
Reviewed the messages.  Please call pt and confirm - does she want [facility] or [facility].  See previous messages.  Ok for referral - neck lesion.

## 2023-04-06 NOTE — Telephone Encounter (Signed)
LVM to call back to office to clarify   - does she want [facility] or [facility]. See previous messages. Ok for referral - neck lesion.

## 2023-04-06 NOTE — Telephone Encounter (Signed)
Pt called back and I read the message to her and she stated she has to ask her husband and she will call us back

## 2023-04-06 NOTE — Telephone Encounter (Signed)
Noted  

## 2023-04-10 ENCOUNTER — Ambulatory Visit: Payer: Managed Care, Other (non HMO) | Attending: Cardiology | Admitting: Cardiology

## 2023-04-10 ENCOUNTER — Encounter: Payer: Self-pay | Admitting: Cardiology

## 2023-04-10 VITALS — BP 130/92 | HR 68 | Ht 67.0 in | Wt 275.4 lb

## 2023-04-10 DIAGNOSIS — I10 Essential (primary) hypertension: Secondary | ICD-10-CM | POA: Diagnosis not present

## 2023-04-10 DIAGNOSIS — R072 Precordial pain: Secondary | ICD-10-CM | POA: Diagnosis not present

## 2023-04-10 DIAGNOSIS — E7800 Pure hypercholesterolemia, unspecified: Secondary | ICD-10-CM | POA: Diagnosis not present

## 2023-04-10 MED ORDER — METOPROLOL TARTRATE 100 MG PO TABS: ORAL_TABLET | ORAL | 0 refills | Status: DC

## 2023-04-10 NOTE — Progress Notes (Signed)
Cardiology Office Note:    Date:  04/10/2023   ID:  Heather Blake, DOB 06/16/1961, MRN [id number]  PCP:  Dale Westover, MD   [facility] Providers Cardiologist:  Debbe Odea, MD     Referring MD: Dale [place], MD   Chief Complaint  Patient presents with   New Patient (Initial Visit)    Patient reports having intermittent chest pains for a couple of years that has recently worsened.  No cardiac history.   Heather Blake is a 62 y.o. female who is being seen today for the evaluation of chest pain at the request of Dale [place], MD.   History of Present Illness:    Heather Blake is a 62 y.o. female with a hx of hypertension, hyperlipidemia, GERD, anxiety who presents due to chest pain.  States having symptoms of chest pain ongoing over several years now.  Symptoms are not related with exertion.  Describes pain as sharp, usually on the left side lasting a few seconds.  Sister had a heart attack in her 17s, sister was also a smoker.  Patient has a chronic cough attributed to ACE inhibitor's, this was stopped with improvement in cough.  Denies edema.  Past Medical History:  Diagnosis Date   Allergy    Anemia    Cancer (HCC)    Depression    Diverticulosis    FHx: migraine headaches    GERD (gastroesophageal reflux disease)    History of chickenpox    Hypercholesterolemia    Hyperlipidemia    Hypertension    Osteoarthritis    Panic attacks    Sleep apnea     Past Surgical History:  Procedure Laterality Date   ABDOMINAL HYSTERECTOMY     CATARACT EXTRACTION W/PHACO Left 08/11/2017   Procedure: CATARACT EXTRACTION PHACO AND INTRAOCULAR LENS PLACEMENT (IOC);  Surgeon: Galen Manila, MD;  Location: [facility] ORS;  Service: Ophthalmology;  Laterality: Left;  [place] 00:31 AP% 19.2 CDE 6.13 Fluid Pack lot # [id number]   CATARACT EXTRACTION W/PHACO Right 09/01/2017   Procedure: CATARACT EXTRACTION PHACO AND INTRAOCULAR LENS PLACEMENT (IOC);  Surgeon:  Galen Manila, MD;  Location: [facility] ORS;  Service: Ophthalmology;  Laterality: Right;  [place] 00:30 AP% 11.7 CDE3.52 fluid pack lot #[id number]   Child Birth  1983 and 1979   COLONOSCOPY     COLONOSCOPY WITH PROPOFOL N/A 08/25/2018   Procedure: COLONOSCOPY WITH PROPOFOL;  Surgeon: Toledo, Boykin Nearing, MD;  Location: [facility] END[facility];  Service: Gastroenterology;  Laterality: N/A;   ESOPHAGOGASTRODUODENOSCOPY     ESOPHAGOGASTRODUODENOSCOPY (EGD) WITH PROPOFOL N/A 08/25/2018   Procedure: ESOPHAGOGASTRODUODENOSCOPY (EGD) WITH PROPOFOL;  Surgeon: Toledo, Boykin Nearing, MD;  Location: [facility];  Service: Gastroenterology;  Laterality: N/A;   FRACTURE SURGERY     LAPAROSCOPIC SUPRACERVICAL HYSTERECTOMY  2006   ovaries left in place   left elbow     left knee     Miscarriage  1987   TONSILLECTOMY  1963   TUBAL LIGATION  1993    Current Medications: Current Meds  Medication Sig   acetaminophen (TYLENOL) 650 MG CR tablet Take 650 mg by mouth every 8 (eight) hours as needed for pain.   albuterol (VENTOLIN HFA) 108 (90 Base) MCG/ACT inhaler Inhale 2 puffs into the lungs every 6 (six) hours as needed for wheezing or shortness of breath.   amLODipine (NORVASC) 10 MG tablet Take 1 tablet (10 mg total) by mouth daily.   aspirin EC 81 MG tablet Take 81 mg by mouth as needed (  chest pain). Swallow whole.   atorvastatin (LIPITOR) 40 MG tablet Take 1 tablet (40 mg total) by mouth daily.   Blood Glucose Monitoring Suppl (CONTOUR NEXT MONITOR) w/Device KIT 1 kit by Does not apply route 2 (two) times daily. DX: E11.9   busPIRone (BUSPAR) 5 MG tablet Take 1 tablet (5 mg total) by mouth 2 (two) times daily.   citalopram (CELEXA) 40 MG tablet Take 1 tablet (40 mg total) by mouth daily.   cyanocobalamin (VITAMIN B12) 1000 MCG/ML injection INJECT 1 ML (CC) INTRAMUSCULARLY ONCE EVERY MONTH   glucose blood (CONTOUR NEXT TEST) test strip USE 1 STRIP TO CHECK GLUCOSE TWICE DAILY   metoprolol tartrate (LOPRESSOR) 100 MG  tablet Take 1 tablet (100 mg total) by mouth once for 1 dose 2 hours prior to CT   Microlet Lancets MISC USE 1  TO CHECK GLUCOSE TWICE DAILY   NEEDLE, DISP, 25 G (B-D DISP NEEDLE 25GX1") 25G X 1" MISC Used to give monthly B-12 injections   ondansetron (ZOFRAN) 4 MG tablet Take 4 mg by mouth every 8 (eight) hours as needed for nausea or vomiting.   spironolactone (ALDACTONE) 25 MG tablet Take 1 tablet by mouth once daily   SYRINGE-NEEDLE, DISP, 3 ML (B-D INTEGRA SYRINGE) 25G X 5/8" 3 ML MISC Used to draw monthly B-12 injections.     Allergies:   Aspartame, Celebrex [celecoxib], Imitrex [sumatriptan], Keflex [cephalexin], Nsaids, Paxil [paroxetine hcl], Percocet [oxycodone-acetaminophen], Rofecoxib, Sulfa antibiotics, and Tylenol [acetaminophen]   Social History   Socioeconomic History   Marital status: Married    Spouse name: Not on file   Number of children: Not on file   Years of education: Not on file   Highest education level: Not on file  Occupational History   Not on file  Tobacco Use   Smoking status: Former    Packs/day: 0.10    Years: 1.50    Additional pack years: 0.00    Total pack years: 0.15    Types: Cigarettes    Quit date: 32    Years since quitting: 27.5   Smokeless tobacco: Never  Vaping Use   Vaping Use: Never used  Substance and Sexual Activity   Alcohol use: No   Drug use: No   Sexual activity: Not on file  Other Topics Concern   Not on file  Social History Narrative   Not on file   Social Determinants of Health   Financial Resource Strain: Not on file  Food Insecurity: Not on file  Transportation Needs: Not on file  Physical Activity: Not on file  Stress: Not on file  Social Connections: Not on file     Family History: The patient's family history includes Arthritis in her mother; Diabetes in her mother; Heart disease in her mother and sister; Heart disease (age of onset: 48) in her father; Heart failure in her father and mother; Hypertension  in her mother; Migraines in her mother and sister; Nephrolithiasis in her mother. There is no history of Breast cancer.  ROS:   Please see the history of present illness.     All other systems reviewed and are negative.  EKGs/Labs/Other Studies Reviewed:    The following studies were reviewed today:  EKG Interpretation Date/Time:  Friday April 10 2023 08:47:11 EDT Ventricular Rate:  68 PR Interval:  164 QRS Duration:  80 QT Interval:  412 QTC Calculation: 438 R Axis:   10  Text Interpretation: Normal sinus rhythm Nonspecific T wave abnormality Confirmed by  Debbe Odea ([id number]) on 04/10/2023 8:56:13 AM    Recent Labs: 09/03/2022: Hemoglobin 13.8; Platelets 278.0 12/26/2022: ALT 22; BUN 14; Creatinine, Ser 0.66; Potassium 3.9; Sodium 141  Recent Lipid Panel    Component Value Date/Time   CHOL 159 12/26/2022 0810   TRIG 206.0 (H) 12/26/2022 0810   HDL 48.60 12/26/2022 0810   CHOLHDL 3 12/26/2022 0810   VLDL 41.2 (H) 12/26/2022 0810   LDLCALC 66 09/03/2022 0736   LDLDIRECT 73.0 12/26/2022 0810     Risk Assessment/Calculations:     Physical Exam:    VS:  BP (!) 130/92 (BP Location: Left Arm, Patient Position: Sitting, Cuff Size: Large)   Pulse 68   Ht 5\' 7"  (1.702 m)   Wt 275 lb 6.4 oz (124.9 kg)   SpO2 98%   BMI 43.13 kg/m     Wt Readings from Last 3 Encounters:  04/10/23 275 lb 6.4 oz (124.9 kg)  03/20/23 271 lb (122.9 kg)  02/24/23 269 lb (122 kg)     GEN:  Well nourished, well developed in no acute distress HEENT: Normal NECK: No JVD; No carotid bruits CARDIAC: RRR, no murmurs, rubs, gallops RESPIRATORY:  Clear to auscultation without rales, wheezing or rhonchi  ABDOMEN: Soft, non-tender, non-distended MUSCULOSKELETAL:  No edema; No deformity  SKIN: Warm and dry NEUROLOGIC:  Alert and oriented x 3 PSYCHIATRIC:  Normal affect   ASSESSMENT:    1. Precordial pain   2. Primary hypertension   3. Pure hypercholesterolemia    PLAN:    In order of  problems listed above:  Chest pain, several risk factors including hypertension, hyperlipidemia.  Get echocardiogram, get coronary CT. Hypertension, slightly elevated today, usually controlled.  Continue Norvasc 10 mg daily, Aldactone 25 mg daily. Hyperlipidemia, continue Lipitor 40 mg daily.  Follow-up after echo and coronary CT.      Medication Adjustments/Labs and Tests Ordered: Current medicines are reviewed at length with the patient today.  Concerns regarding medicines are outlined above.  Orders Placed This Encounter  Procedures   CT CORONARY MORPH W/CTA COR W/SCORE W/CA W/CM &/OR WO/CM   Basic Metabolic Panel (BMET)   EKG 12-Lead   ECHOCARDIOGRAM COMPLETE   Meds ordered this encounter  Medications   metoprolol tartrate (LOPRESSOR) 100 MG tablet    Sig: Take 1 tablet (100 mg total) by mouth once for 1 dose 2 hours prior to CT    Dispense:  1 tablet    Refill:  0    Patient Instructions  Medication Instructions:  Your physician recommends that you continue on your current medications as directed. Please refer to the Current Medication list given to you today.  *If you need a refill on your cardiac medications before your next appointment, please call your pharmacy*   Lab Work: Your physician recommends that you return for lab work within 30 days of CT: BMET Medical Mall Entrance at [facility] 1st desk on the right to check in (REGISTRATION)  Lab hours: Monday- Friday (7:30 am- 5:30 pm)  If you have labs (blood work) drawn today and your tests are completely normal, you will receive your results only by: MyChart Message (if you have MyChart) OR A paper copy in the mail If you have any lab test that is abnormal or we need to change your treatment, we will call you to review the results.   Testing/Procedures: Your physician has requested that you have an echocardiogram. Echocardiography is a painless test that uses sound waves to create images of  your heart. It provides your  doctor with information about the size and shape of your heart and how well your heart's chambers and valves are working. This procedure takes approximately one hour. There are no restrictions for this procedure. Please do NOT wear cologne, perfume, aftershave, or lotions (deodorant is allowed). Please arrive 15 minutes prior to your appointment time.     Your cardiac CT will be scheduled at one of the below locations:   [facility] [address] [place], [place] [address] [phone]   Please follow these instructions carefully (unless otherwise directed):  An IV will be required for this test and Nitroglycerin will be given.   On the Night Before the Test: Be sure to Drink plenty of water. Do not consume any caffeinated/decaffeinated beverages or chocolate 12 hours prior to your test. Do not take any antihistamines 12 hours prior to your test.  On the Day of the Test: Drink plenty of water until 1 hour prior to the test. Do not eat any food 1 hour prior to test. You may take your regular medications prior to the test.  Take metoprolol (Lopressor) two hours prior to test. If you take Furosemide/Hydrochlorothiazide/Spironolactone, please HOLD on the morning of the test. FEMALES- please wear underwire-free bra if available, avoid dresses & tight clothing       After the Test: Drink plenty of water. After receiving IV contrast, you may experience a mild flushed feeling. This is normal. On occasion, you may experience a mild rash up to 24 hours after the test. This is not dangerous. If this occurs, you can take Benadryl 25 mg and increase your fluid intake. If you experience trouble breathing, this can be serious. If it is severe call 911 IMMEDIATELY. If it is mild, please call our office. If you take any of these medications: Glipizide/Metformin, Avandament, Glucavance, please do not take 48 hours after completing test unless otherwise instructed.  We  will call to schedule your test 2-4 weeks out understanding that some insurance companies will need an authorization prior to the service being performed.   For more information and frequently asked questions, please visit our website : [email or web address]  For non-scheduling related questions, please contact the cardiac imaging nurse navigator should you have any questions/concerns: Rockwell Alexandria, Cardiac Imaging Nurse Navigator Larey Brick, Cardiac Imaging Nurse Navigator [facility] Direct Office Dial: [phone]   For scheduling needs, including cancellations and rescheduling, please call [place], [phone].   Follow-Up: At [facility], you and your health needs are our priority.  As part of our continuing mission to provide you with exceptional heart care, we have created designated Provider Care Teams.  These Care Teams include your primary Cardiologist (physician) and Advanced Practice Providers (APPs -  Physician Assistants and Nurse Practitioners) who all work together to provide you with the care you need, when you need it.  We recommend signing up for the patient portal called "MyChart".  Sign up information is provided on this After Visit Summary.  MyChart is used to connect with patients for Virtual Visits (Telemedicine).  Patients are able to view lab/test results, encounter notes, upcoming appointments, etc.  Non-urgent messages can be sent to your provider as well.   To learn more about what you can do with MyChart, go to [email or web address].    Your next appointment:   2 - 3 month(s)  Provider:   You may see Debbe Odea, MD or one of the following  Advanced Practice Providers on your designated Care Team:   Nicolasa Ducking, NP Eula Listen, PA-C Cadence Fransico Michael, [place] Charlsie Quest, NP    Other Instructions -None    Signed, Debbe Odea, MD  04/10/2023 9:18 AM    [facility]

## 2023-04-10 NOTE — Patient Instructions (Signed)
Medication Instructions:  Your physician recommends that you continue on your current medications as directed. Please refer to the Current Medication list given to you today.  *If you need a refill on your cardiac medications before your next appointment, please call your pharmacy*   Lab Work: Your physician recommends that you return for lab work within 30 days of CT: BMET Medical Mall Entrance at [facility] 1st desk on the right to check in (REGISTRATION)  Lab hours: Monday- Friday (7:30 am- 5:30 pm)  If you have labs (blood work) drawn today and your tests are completely normal, you will receive your results only by: MyChart Message (if you have MyChart) OR A paper copy in the mail If you have any lab test that is abnormal or we need to change your treatment, we will call you to review the results.   Testing/Procedures: Your physician has requested that you have an echocardiogram. Echocardiography is a painless test that uses sound waves to create images of your heart. It provides your doctor with information about the size and shape of your heart and how well your heart's chambers and valves are working. This procedure takes approximately one hour. There are no restrictions for this procedure. Please do NOT wear cologne, perfume, aftershave, or lotions (deodorant is allowed). Please arrive 15 minutes prior to your appointment time.     Your cardiac CT will be scheduled at one of the below locations:   [facility] [address] [place], [place] [address] [phone]   Please follow these instructions carefully (unless otherwise directed):  An IV will be required for this test and Nitroglycerin will be given.   On the Night Before the Test: Be sure to Drink plenty of water. Do not consume any caffeinated/decaffeinated beverages or chocolate 12 hours prior to your test. Do not take any antihistamines 12 hours prior to your test.  On the Day of the  Test: Drink plenty of water until 1 hour prior to the test. Do not eat any food 1 hour prior to test. You may take your regular medications prior to the test.  Take metoprolol (Lopressor) two hours prior to test. If you take Furosemide/Hydrochlorothiazide/Spironolactone, please HOLD on the morning of the test. FEMALES- please wear underwire-free bra if available, avoid dresses & tight clothing       After the Test: Drink plenty of water. After receiving IV contrast, you may experience a mild flushed feeling. This is normal. On occasion, you may experience a mild rash up to 24 hours after the test. This is not dangerous. If this occurs, you can take Benadryl 25 mg and increase your fluid intake. If you experience trouble breathing, this can be serious. If it is severe call 911 IMMEDIATELY. If it is mild, please call our office. If you take any of these medications: Glipizide/Metformin, Avandament, Glucavance, please do not take 48 hours after completing test unless otherwise instructed.  We will call to schedule your test 2-4 weeks out understanding that some insurance companies will need an authorization prior to the service being performed.   For more information and frequently asked questions, please visit our website : [email or web address]  For non-scheduling related questions, please contact the cardiac imaging nurse navigator should you have any questions/concerns: Rockwell Alexandria, Cardiac Imaging Nurse Navigator Larey Brick, Cardiac Imaging Nurse Navigator [place] [facility] Direct Office Dial: [phone]   For scheduling needs, including cancellations and rescheduling, please call [place], [phone].   Follow-Up: At   [facility], you and your health needs are our priority.  As part of our continuing mission to provide you with exceptional heart care, we have created designated Provider Care Teams.  These Care Teams include your  primary Cardiologist (physician) and Advanced Practice Providers (APPs -  Physician Assistants and Nurse Practitioners) who all work together to provide you with the care you need, when you need it.  We recommend signing up for the patient portal called "MyChart".  Sign up information is provided on this After Visit Summary.  MyChart is used to connect with patients for Virtual Visits (Telemedicine).  Patients are able to view lab/test results, encounter notes, upcoming appointments, etc.  Non-urgent messages can be sent to your provider as well.   To learn more about what you can do with MyChart, go to [email or web address].    Your next appointment:   2 - 3 month(s)  Provider:   You may see Debbe Odea, MD or one of the following Advanced Practice Providers on your designated Care Team:   Nicolasa Ducking, NP Eula Listen, PA-C Cadence Fransico Michael, PA-C Charlsie Quest, NP    Other Instructions -None

## 2023-04-22 ENCOUNTER — Ambulatory Visit: Payer: Managed Care, Other (non HMO) | Attending: Cardiology

## 2023-04-22 DIAGNOSIS — I340 Nonrheumatic mitral (valve) insufficiency: Secondary | ICD-10-CM | POA: Diagnosis not present

## 2023-04-22 DIAGNOSIS — R072 Precordial pain: Secondary | ICD-10-CM

## 2023-04-22 LAB — ECHOCARDIOGRAM COMPLETE
Area-P 1/2: 3.6 cm2
S' Lateral: 3.4 cm

## 2023-04-27 ENCOUNTER — Other Ambulatory Visit
Admission: RE | Admit: 2023-04-27 | Discharge: 2023-04-27 | Disposition: A | Discharge status: Home / Self Care | Payer: Managed Care, Other (non HMO) | Attending: Cardiology | Admitting: Cardiology

## 2023-04-27 ENCOUNTER — Other Ambulatory Visit
Admission: RE | Admit: 2023-04-27 | Discharge: 2023-04-27 | Disposition: A | Discharge status: Home / Self Care | Payer: Managed Care, Other (non HMO) | Source: Home / Self Care | Attending: Internal Medicine | Admitting: Internal Medicine

## 2023-04-27 ENCOUNTER — Other Ambulatory Visit: Payer: Self-pay | Admitting: Internal Medicine

## 2023-04-27 DIAGNOSIS — I10 Essential (primary) hypertension: Secondary | ICD-10-CM

## 2023-04-27 DIAGNOSIS — R739 Hyperglycemia, unspecified: Secondary | ICD-10-CM | POA: Insufficient documentation

## 2023-04-27 DIAGNOSIS — E7800 Pure hypercholesterolemia, unspecified: Secondary | ICD-10-CM | POA: Insufficient documentation

## 2023-04-27 DIAGNOSIS — R072 Precordial pain: Secondary | ICD-10-CM | POA: Insufficient documentation

## 2023-04-27 LAB — HEPATIC FUNCTION PANEL
ALT: 37 U/L (ref 0–44)
AST: 40 U/L (ref 15–41)
Albumin: 4.5 g/dL (ref 3.5–5.0)
Alkaline Phosphatase: 61 U/L (ref 38–126)
Bilirubin, Direct: 0.1 mg/dL (ref 0.0–0.2)
Indirect Bilirubin: 0.6 mg/dL (ref 0.3–0.9)
Total Bilirubin: 0.7 mg/dL (ref 0.3–1.2)
Total Protein: 8.1 g/dL (ref 6.5–8.1)

## 2023-04-27 LAB — LIPID PANEL
Cholesterol: 214 mg/dL — ABNORMAL HIGH (ref 0–200)
HDL: 49 mg/dL (ref >40)
LDL Cholesterol: 124 mg/dL — ABNORMAL HIGH (ref 0–99)
Total CHOL/HDL Ratio: 4.4 RATIO
Triglycerides: 205 mg/dL — ABNORMAL HIGH (ref <150)
VLDL: 41 mg/dL — ABNORMAL HIGH (ref 0–40)

## 2023-04-27 LAB — HEMOGLOBIN A1C
Hgb A1c MFr Bld: 5.8 % — ABNORMAL HIGH (ref 4.8–5.6)
Mean Plasma Glucose: 119.76 mg/dL

## 2023-04-27 LAB — BASIC METABOLIC PANEL
Anion gap: 11 (ref 5–15)
BUN: 7 mg/dL — ABNORMAL LOW (ref 8–23)
CO2: 22 mmol/L (ref 22–32)
Calcium: 9.6 mg/dL (ref 8.9–10.3)
Chloride: 104 mmol/L (ref 98–111)
Creatinine, Ser: 0.63 mg/dL (ref 0.44–1.00)
GFR, Estimated: >60 mL/min (ref >60)
Glucose, Bld: 109 mg/dL — ABNORMAL HIGH (ref 70–99)
Potassium: 4.1 mmol/L (ref 3.5–5.1)
Sodium: 137 mmol/L (ref 135–145)

## 2023-04-27 LAB — TSH: TSH: 0.61 u[IU]/mL (ref 0.350–4.500)

## 2023-04-27 NOTE — Addendum Note (Signed)
Addended by: Yehuda Savannah on: 04/27/2023 03:38 PM   Modules accepted: Orders

## 2023-04-27 NOTE — Progress Notes (Signed)
 Order placed for add on lab - met b.  

## 2023-04-28 ENCOUNTER — Telehealth: Payer: Self-pay

## 2023-04-28 NOTE — Telephone Encounter (Signed)
-----   Message from [place] sent at 04/27/2023  9:33 PM EDT ----- Notify - overall sugar control stable from last check (actually slightly improved).  Cholesterol has increased.  Confirm if she is taking lipitor 40mg  daily.  If no, then needs to restart ( if has not problems taking lipitor).  If taking regularly and not skipped dose, then I would like to change lipitor 40mg  to crestor 40mg  q day.  Thyroid test and liver function tests are wnl.

## 2023-04-28 NOTE — Telephone Encounter (Addendum)
Patient returned office phone call and note was read. Yes, she takes her Lipitor 40 mg daily. She is ok with taking Crestor, IF her insurance covers medication.  Also, she had labs done for a CT scan, Dr Azucena Cecil ordered. He needed a Basic Metabolic Panel done for scan. Please let Dr Lorin Picket know results are in patient's chart and to cancel the one she ordered.

## 2023-04-29 ENCOUNTER — Encounter (HOSPITAL_COMMUNITY): Payer: Self-pay

## 2023-04-29 MED ORDER — ROSUVASTATIN CALCIUM 40 MG PO TABS: 40.0000 mg | ORAL_TABLET | Freq: Every day | ORAL | 0 refills | Status: DC

## 2023-04-29 NOTE — Telephone Encounter (Signed)
Crestor sent in. My chart sent to patient.

## 2023-04-29 NOTE — Addendum Note (Signed)
Addended by: Rita Ohara D on: 04/29/2023 08:51 AM   Modules accepted: Orders

## 2023-05-01 ENCOUNTER — Telehealth (HOSPITAL_COMMUNITY): Payer: Self-pay | Admitting: *Deleted

## 2023-05-01 NOTE — Telephone Encounter (Signed)
Attempted to call patient regarding upcoming cardiac CT appointment. Left message on voicemail with name and callback number Hayley Sharpe RN Navigator Cardiac Imaging [facility] [facility] [phone] Office   

## 2023-05-02 ENCOUNTER — Encounter: Payer: Self-pay | Admitting: Internal Medicine

## 2023-05-02 ENCOUNTER — Other Ambulatory Visit: Payer: Self-pay | Admitting: Internal Medicine

## 2023-05-04 ENCOUNTER — Ambulatory Visit
Admission: RE | Admit: 2023-05-04 | Discharge: 2023-05-04 | Disposition: A | Discharge status: Home / Self Care | Payer: Managed Care, Other (non HMO) | Source: Ambulatory Visit | Attending: Cardiology | Admitting: Cardiology

## 2023-05-04 DIAGNOSIS — R072 Precordial pain: Secondary | ICD-10-CM | POA: Diagnosis not present

## 2023-05-04 MED ORDER — NITROGLYCERIN 0.4 MG SL SUBL
0.8000 mg | SUBLINGUAL_TABLET | Freq: Once | SUBLINGUAL | Status: AC
  Administered 2023-05-04: 0.8 mg via SUBLINGUAL
  Filled 2023-05-04: qty 25

## 2023-05-04 MED ORDER — IOHEXOL 350 MG/ML SOLN
100.0000 mL | Freq: Once | INTRAVENOUS | Status: AC | PRN
  Administered 2023-05-04: 100 mL via INTRAVENOUS

## 2023-05-04 NOTE — Progress Notes (Signed)

## 2023-05-04 NOTE — Telephone Encounter (Signed)
Please refuse. Per patient my chart message, needs different glucometer and supplies due to insurance coverage

## 2023-05-07 MED ORDER — LANCETS MISC. MISC: 11 refills | Status: DC

## 2023-05-07 MED ORDER — BLOOD GLUCOSE MONITORING SUPPL DEVI: 0 refills | Status: AC

## 2023-05-07 MED ORDER — BLOOD GLUCOSE TEST VI STRP: ORAL_STRIP | 11 refills | Status: DC

## 2023-05-12 ENCOUNTER — Other Ambulatory Visit: Payer: Managed Care, Other (non HMO)

## 2023-05-15 ENCOUNTER — Ambulatory Visit (INDEPENDENT_AMBULATORY_CARE_PROVIDER_SITE_OTHER): Payer: Managed Care, Other (non HMO) | Admitting: Internal Medicine

## 2023-05-15 ENCOUNTER — Encounter: Payer: Self-pay | Admitting: Internal Medicine

## 2023-05-15 VITALS — BP 120/76 | HR 72 | Temp 97.8°F | Ht 67.0 in | Wt 271.0 lb

## 2023-05-15 DIAGNOSIS — F419 Anxiety disorder, unspecified: Secondary | ICD-10-CM | POA: Diagnosis not present

## 2023-05-15 DIAGNOSIS — G4730 Sleep apnea, unspecified: Secondary | ICD-10-CM

## 2023-05-15 DIAGNOSIS — I10 Essential (primary) hypertension: Secondary | ICD-10-CM

## 2023-05-15 DIAGNOSIS — R739 Hyperglycemia, unspecified: Secondary | ICD-10-CM

## 2023-05-15 DIAGNOSIS — K219 Gastro-esophageal reflux disease without esophagitis: Secondary | ICD-10-CM

## 2023-05-15 DIAGNOSIS — Z0000 Encounter for general adult medical examination without abnormal findings: Secondary | ICD-10-CM | POA: Diagnosis not present

## 2023-05-15 DIAGNOSIS — E7800 Pure hypercholesterolemia, unspecified: Secondary | ICD-10-CM

## 2023-05-15 MED ORDER — ROSUVASTATIN CALCIUM 40 MG PO TABS: 40.0000 mg | ORAL_TABLET | Freq: Every day | ORAL | 1 refills | Status: DC

## 2023-05-15 NOTE — Assessment & Plan Note (Signed)
Low carb diet and exercise.  Follow met b and A1c.  Lab Results  Component Value Date   HGBA1C 5.8 (H) 04/27/2023

## 2023-05-15 NOTE — Assessment & Plan Note (Signed)
No upper symptoms reported.  On Protonix. 

## 2023-05-15 NOTE — Assessment & Plan Note (Signed)
On crestor.  Low-cholesterol diet and exercise.  Follow lipid panel liver function tests.   Lab Results  Component Value Date   CHOL 214 (H) 04/27/2023   HDL 49 04/27/2023   LDLCALC 124 (H) 04/27/2023   LDLDIRECT 73.0 12/26/2022   TRIG 205 (H) 04/27/2023   CHOLHDL 4.4 04/27/2023

## 2023-05-15 NOTE — Assessment & Plan Note (Signed)
Follow cbc.  

## 2023-05-15 NOTE — Assessment & Plan Note (Signed)
Increased stress as outlined.  Discussed.  Overall doing better. Continue celexa. No changes. Follow.

## 2023-05-15 NOTE — Assessment & Plan Note (Signed)
Blood pressure doing well off lisinopril.  Remain off.  Continue aldactone.  Follow pressures.  Follow metabolic panel.

## 2023-05-15 NOTE — Assessment & Plan Note (Signed)
Continue cpap.  

## 2023-05-15 NOTE — Assessment & Plan Note (Signed)
Physical today 05/15/23.  Mammogram 09/15/22 - Birads I.  Colonoscopy 08/2018 - internal hemorrhoids.  F/u in 10 years.

## 2023-05-15 NOTE — Progress Notes (Signed)
Subjective:    [name], female    DOB: 04-10-1961, 62 y.o.   MRN: [id number]  Patient here for  Chief Complaint  Patient presents with   Annual Exam    HPI Here for a physical exam. Off lisinopril.  Concern was contributing to cough.  Continues on aldactone. Increased stress.  Continues on citalopram. Is doing better.  Feeling better.  Handling things relatively well.  Evaluated recently for chest pain.  Saw cardiology.  Recommended echo and coronary CT. Normal coronary CTA.  No evidence of CAD. ECHO - normal ejection fraction and mild MR. Daughter just visited.  Here for three weeks.  Feeling good.    Past Medical History:  Diagnosis Date   Allergy    Anemia    Cancer (HCC)    Depression    Diverticulosis    FHx: migraine headaches    GERD (gastroesophageal reflux disease)    History of chickenpox    Hypercholesterolemia    Hyperlipidemia    Hypertension    Osteoarthritis    Panic attacks    Sleep apnea    Past Surgical History:  Procedure Laterality Date   ABDOMINAL HYSTERECTOMY     CATARACT EXTRACTION W/PHACO Left 08/11/2017   Procedure: CATARACT EXTRACTION PHACO AND INTRAOCULAR LENS PLACEMENT (IOC);  Surgeon: Galen Manila, MD;  Location: [facility];  Service: Ophthalmology;  Laterality: Left;  [place] 00:31 AP% 19.2 CDE 6.13 Fluid Pack lot # [id number]   CATARACT EXTRACTION W/PHACO Right 09/01/2017   Procedure: CATARACT EXTRACTION PHACO AND INTRAOCULAR LENS PLACEMENT (IOC);  Surgeon: Galen Manila, MD;  Location: [facility];  Service: Ophthalmology;  Laterality: Right;  [place] 00:30 AP% 11.7 CDE3.52 fluid pack lot #[id number]   Child Birth  1983 and 1981   COLONOSCOPY     COLONOSCOPY WITH PROPOFOL N/A 08/25/2018   Procedure: COLONOSCOPY WITH PROPOFOL;  Surgeon: Toledo, Boykin Nearing, MD;  Location: [facility];  Service: Gastroenterology;  Laterality: N/A;   ESOPHAGOGASTRODUODENOSCOPY     ESOPHAGOGASTRODUODENOSCOPY (EGD) WITH PROPOFOL N/A 08/25/2018    Procedure: ESOPHAGOGASTRODUODENOSCOPY (EGD) WITH PROPOFOL;  Surgeon: Toledo, Boykin Nearing, MD;  Location: [facility];  Service: Gastroenterology;  Laterality: N/A;   FRACTURE SURGERY     LAPAROSCOPIC SUPRACERVICAL HYSTERECTOMY  2006   ovaries left in place   left elbow     left knee     Miscarriage  1987   TONSILLECTOMY  1963   TUBAL LIGATION  1993   Family History  Problem Relation Age of Onset   Heart disease Mother        Incomplete heart block   Hypertension Mother    Diabetes Mother    Nephrolithiasis Mother    Migraines Mother    Arthritis Mother        degenerative-back,osteoporosis   Heart failure Mother    Heart disease Father 57       Myocardial infarction   Heart failure Father    Heart disease Sister        h/o MI   Migraines Sister    Breast cancer Neg Hx    Social History   Socioeconomic History   Marital status: Married    Spouse name: Not on file   Number of children: Not on file   Years of education: Not on file   Highest education level: Not on file  Occupational History   Not on file  Tobacco Use   Smoking status: Former    Current packs/day: 0.00  Average packs/day: 0.1 packs/day for 1.5 years (0.2 ttl pk-yrs)    Types: Cigarettes    Start date: 04/1994    Quit date: 1997    Years since quitting: 27.6   Smokeless tobacco: Never  Vaping Use   Vaping status: Never Used  Substance and Sexual Activity   Alcohol use: No   Drug use: No   Sexual activity: Not on file  Other Topics Concern   Not on file  Social History Narrative   Not on file   Social Determinants of Health   Financial Resource Strain: Not on file  Food Insecurity: Not on file  Transportation Needs: Not on file  Physical Activity: Not on file  Stress: Not on file  Social Connections: Not on file     Review of Systems  Constitutional:  Negative for appetite change and unexpected weight change.  HENT:  Negative for congestion, sinus pressure and sore throat.    Eyes:  Negative for pain and visual disturbance.  Respiratory:  Negative for cough, chest tightness and shortness of breath.   Cardiovascular:  Negative for chest pain, palpitations and leg swelling.  Gastrointestinal:  Negative for abdominal pain, diarrhea, nausea and vomiting.       Bowels better with probiotics.  No problems now.   Genitourinary:  Negative for difficulty urinating and dysuria.  Musculoskeletal:  Negative for joint swelling and myalgias.  Skin:  Negative for color change and rash.  Neurological:  Negative for dizziness and headaches.  Hematological:  Negative for adenopathy. Does not bruise/bleed easily.  Psychiatric/Behavioral:  Negative for agitation and dysphoric mood.        Objective:     BP 120/76   Pulse 72   Temp 97.8 F (36.6 C) (Oral)   Ht 5\' 7"  (1.702 m)   Wt 271 lb (122.9 kg)   SpO2 97%   BMI 42.44 kg/m  Wt Readings from Last 3 Encounters:  05/15/23 271 lb (122.9 kg)  04/10/23 275 lb 6.4 oz (124.9 kg)  03/20/23 271 lb (122.9 kg)    Physical Exam Vitals reviewed.  Constitutional:      General: She is not in acute distress.    Appearance: Normal appearance. She is well-developed.  HENT:     Head: Normocephalic and atraumatic.     Right Ear: External ear normal.     Left Ear: External ear normal.  Eyes:     General: No scleral icterus.       Right eye: No discharge.        Left eye: No discharge.     Conjunctiva/sclera: Conjunctivae normal.  Neck:     Thyroid: No thyromegaly.  Cardiovascular:     Rate and Rhythm: Normal rate and regular rhythm.  Pulmonary:     Effort: No tachypnea, accessory muscle usage or respiratory distress.     Breath sounds: Normal breath sounds. No decreased breath sounds or wheezing.  Chest:  Breasts:    Right: No inverted nipple, mass, nipple discharge or tenderness (no axillary adenopathy).     Left: No inverted nipple, mass, nipple discharge or tenderness (no axilarry adenopathy).  Abdominal:      General: Bowel sounds are normal.     Palpations: Abdomen is soft.     Tenderness: There is no abdominal tenderness.  Musculoskeletal:        General: No swelling or tenderness.     Cervical back: Neck supple.  Lymphadenopathy:     Cervical: No cervical adenopathy.  Skin:  Findings: No erythema or rash.  Neurological:     Mental Status: She is alert and oriented to person, place, and time.  Psychiatric:        Mood and Affect: Mood normal.        Behavior: Behavior normal.      Outpatient Encounter Medications as of 05/15/2023  Medication Sig   acetaminophen (TYLENOL) 650 MG CR tablet Take 650 mg by mouth every 8 (eight) hours as needed for pain.   albuterol (VENTOLIN HFA) 108 (90 Base) MCG/ACT inhaler Inhale 2 puffs into the lungs every 6 (six) hours as needed for wheezing or shortness of breath.   amLODipine (NORVASC) 10 MG tablet Take 1 tablet (10 mg total) by mouth daily.   aspirin EC 81 MG tablet Take 81 mg by mouth as needed (chest pain). Swallow whole.   Blood Glucose Monitoring Suppl DEVI Use as directed to check blood sugars twice daily.   busPIRone (BUSPAR) 5 MG tablet Take 1 tablet (5 mg total) by mouth 2 (two) times daily.   citalopram (CELEXA) 40 MG tablet Take 1 tablet (40 mg total) by mouth daily.   cyanocobalamin (VITAMIN B12) 1000 MCG/ML injection INJECT 1 ML (CC) INTRAMUSCULARLY ONCE EVERY MONTH   Fructose-Dextrose-Phosphor Acd (SB ANTI-NAUSEA PO) Take by mouth.   Glucose Blood (BLOOD GLUCOSE TEST STRIPS) STRP Use as directed to check blood sugars twice daily.   Lancets Misc. MISC Use as directed to check blood sugars twice daily.   NEEDLE, DISP, 25 G (B-D DISP NEEDLE 25GX1") 25G X 1" MISC Used to give monthly B-12 injections   spironolactone (ALDACTONE) 25 MG tablet Take 1 tablet by mouth once daily   SYRINGE-NEEDLE, DISP, 3 ML (B-D INTEGRA SYRINGE) 25G X 5/8" 3 ML MISC Used to draw monthly B-12 injections.   rosuvastatin (CRESTOR) 40 MG tablet Take 1 tablet (40  mg total) by mouth daily.   [DISCONTINUED] metoprolol tartrate (LOPRESSOR) 100 MG tablet Take 1 tablet (100 mg total) by mouth once for 1 dose 2 hours prior to CT (Patient not taking: Reported on 05/15/2023)   [DISCONTINUED] Microlet Lancets MISC USE 1  TO CHECK GLUCOSE TWICE DAILY   [DISCONTINUED] ondansetron (ZOFRAN) 4 MG tablet Take 4 mg by mouth every 8 (eight) hours as needed for nausea or vomiting. (Patient not taking: Reported on 05/15/2023)   [DISCONTINUED] rosuvastatin (CRESTOR) 40 MG tablet Take 1 tablet (40 mg total) by mouth daily.   No facility-administered encounter medications on file as of 05/15/2023.     Lab Results  Component Value Date   WBC 8.6 09/03/2022   HGB 13.8 09/03/2022   HCT 41.1 09/03/2022   PLT 278.0 09/03/2022   GLUCOSE 109 (H) 04/27/2023   CHOL 214 (H) 04/27/2023   TRIG 205 (H) 04/27/2023   HDL 49 04/27/2023   LDLDIRECT 73.0 12/26/2022   LDLCALC 124 (H) 04/27/2023   ALT 37 04/27/2023   AST 40 04/27/2023   NA 137 04/27/2023   K 4.1 04/27/2023   CL 104 04/27/2023   CREATININE 0.63 04/27/2023   BUN 7 (L) 04/27/2023   CO2 22 04/27/2023   TSH 0.610 04/27/2023   HGBA1C 5.8 (H) 04/27/2023    CT CORONARY MORPH W/CTA COR W/SCORE W/CA W/CM &/OR WO/CM  Addendum Date: 05/11/2023   ADDENDUM REPORT: 05/11/2023 07:44 EXAM: OVER-READ INTERPRETATION  CT CHEST The following report is an over-read performed by radiologist Dr. Noe Gens [facility] on 05/11/2023. This over-read does not include interpretation of cardiac or coronary  anatomy or pathology. The coronary CTA interpretation by the cardiologist is attached. COMPARISON:  None. FINDINGS: Heart is normal size. Aorta normal caliber. No adenopathy. No confluent airspace opacities or effusions. No acute findings in the upper abdomen. Chest wall soft tissues are unremarkable. No acute bony abnormality. IMPRESSION: No acute or significant extracardiac abnormality Electronically Signed   By: Charlett Nose M.D.    On: 05/11/2023 07:44   Result Date: 05/11/2023 CLINICAL DATA:  Chest pain EXAM: Cardiac/Coronary  CTA TECHNIQUE: The patient was scanned on a Siemens Somatom force scanner. : A prospective scan was triggered in the ascending thoracic aorta. Axial non-contrast 3 mm slices were carried out through the heart. The data set was analyzed on a dedicated work station and scored using the Agatson method. Gantry rotation speed was 330 msecs and collimation was .6 mm. 100mg  of metoprolol and 0.8 mg of sl NTG was given. The 3D data set was reconstructed in 5% intervals of the 60-95 % of the R-R cycle. Diastolic phases were analyzed on a dedicated work station using MPR, MIP and VRT modes. The patient received 75 cc of contrast. FINDINGS: Aorta:  Normal size.  No calcifications.  No dissection. Aortic Valve:  Trileaflet.  No calcifications. Coronary Arteries:  Normal coronary origin.  Right dominance. RCA is a dominant artery. There is no plaque. Left main gives rise to LAD and LCX arteries. LM has no disease. LAD has no plaque. LCX is a non-dominant artery.  There is no plaque. Other findings: Normal pulmonary vein drainage into the left atrium. Normal left atrial appendage without a thrombus. Normal size of the pulmonary artery. IMPRESSION: 1. Coronary calcium score of 0. 2. Normal coronary origin with right dominance. 3. No evidence of CAD. 4. CAD-RADS 0. Consider non-atherosclerotic causes of chest pain. Electronically Signed: By: Debbe Odea M.D. On: 05/04/2023 14:22       Assessment & Plan:  Routine general medical examination at a health care facility  Healthcare maintenance Assessment & Plan: Physical today 05/15/23.  Mammogram 09/15/22 - Birads I.  Colonoscopy 08/2018 - internal hemorrhoids.  F/u in 10 years.     Anxiety Assessment & Plan: Increased stress as outlined.  Discussed.  Overall doing better. Continue celexa. No changes. Follow.    Essential hypertension, benign Assessment &  Plan: Blood pressure doing well off lisinopril.  Remain off.  Continue aldactone.  Follow pressures.  Follow metabolic panel.   Orders: -     CBC with Differential/Platelet; Future -     Basic metabolic panel; Future  Gastroesophageal reflux disease, unspecified whether esophagitis present Assessment & Plan: No upper symptoms reported.  On Protonix.   Hypercholesterolemia Assessment & Plan: On crestor.  Low-cholesterol diet and exercise.  Follow lipid panel liver function tests.   Lab Results  Component Value Date   CHOL 214 (H) 04/27/2023   HDL 49 04/27/2023   LDLCALC 124 (H) 04/27/2023   LDLDIRECT 73.0 12/26/2022   TRIG 205 (H) 04/27/2023   CHOLHDL 4.4 04/27/2023    Orders: -     Hepatic function panel; Future -     Lipid panel; Future  Hyperglycemia Assessment & Plan: Low carb diet and exercise.  Follow met b and A1c.  Lab Results  Component Value Date   HGBA1C 5.8 (H) 04/27/2023    Orders: -     Hemoglobin A1c; Future  Sleep apnea, unspecified type Assessment & Plan: Continue  cpap.    Other orders -     Rosuvastatin Calcium;  Take 1 tablet (40 mg total) by mouth daily.  Dispense: 90 tablet; Refill: 1     Dale Regan, MD

## 2023-06-03 ENCOUNTER — Other Ambulatory Visit: Payer: Self-pay | Admitting: Internal Medicine

## 2023-06-03 DIAGNOSIS — E538 Deficiency of other specified B group vitamins: Secondary | ICD-10-CM

## 2023-06-04 ENCOUNTER — Other Ambulatory Visit: Payer: Self-pay | Admitting: Internal Medicine

## 2023-06-04 DIAGNOSIS — E538 Deficiency of other specified B group vitamins: Secondary | ICD-10-CM

## 2023-06-10 ENCOUNTER — Ambulatory Visit: Payer: Managed Care, Other (non HMO) | Admitting: Internal Medicine

## 2023-06-12 ENCOUNTER — Encounter: Payer: Self-pay | Admitting: Internal Medicine

## 2023-06-12 ENCOUNTER — Ambulatory Visit (INDEPENDENT_AMBULATORY_CARE_PROVIDER_SITE_OTHER): Payer: Managed Care, Other (non HMO) | Admitting: Internal Medicine

## 2023-06-12 VITALS — BP 134/78 | HR 79 | Temp 98.4°F | Ht 67.0 in | Wt 269.6 lb

## 2023-06-12 DIAGNOSIS — G4733 Obstructive sleep apnea (adult) (pediatric): Secondary | ICD-10-CM

## 2023-06-12 NOTE — Progress Notes (Signed)
[facility] [place] [facility] Consultation      Date: 06/12/2023,   MRN# [id number] Heather Blake 1960-11-30   CHIEF COMPLAINT:  Follow-up assessment for chronic cough and chronic shortness of breath Follow-up assessment for OSA    HISTORY OF PRESENT ILLNESS  62 year old pleasant white female weighs approximately 270 pounds  Chronic cough and shortness of breath for many years      DX of OSA 2019 AHI 9.6 Patient had sleep study in 2019 which showed mild sleep apnea currently on CPAP CPAP download reviewed in detail with patient excellent compliance ACE inhibitor stopped at last office visit Patient has excellent compliance report AHI reduced to 0.8 Auto CPAP 5-15 Fullface mask  Current weight 269  Ideal body weight should be 157   Other pertinent history Patient had chest x-ray in November 2023 no significant findings Patient has seasonal allergies which she uses Benadryl as needed Patient is a non-smoker nonalcoholic no pets has carpeting she has a lot of stress and anxiety taking care of her terminally ill husband albuterol as needed really helps her cough does not have any triggers   Cough has resolved since stopping ACE inhibitor  No exacerbation at this time No evidence of heart failure at this time No evidence or signs of infection at this time No respiratory distress No fevers, chills, nausea, vomiting, diarrhea No evidence of lower extremity edema No evidence hemoptysis     PAST MEDICAL HISTORY   Past Medical History:  Diagnosis Date   Allergy    Anemia    Cancer (HCC)    Depression    Diverticulosis    FHx: migraine headaches    GERD (gastroesophageal reflux disease)    History of chickenpox    Hypercholesterolemia    Hyperlipidemia    Hypertension    Osteoarthritis    Panic attacks    Sleep apnea     SURGICAL HISTORY   Past Surgical History:  Procedure Laterality Date   ABDOMINAL HYSTERECTOMY     CATARACT EXTRACTION W/PHACO  Left 08/11/2017   Procedure: CATARACT EXTRACTION PHACO AND INTRAOCULAR LENS PLACEMENT (IOC);  Surgeon: Galen Manila, MD;  Location: [facility];  Service: Ophthalmology;  Laterality: Left;  [place] 00:31 AP% 19.2 CDE 6.13 Fluid Pack lot # [id number]   CATARACT EXTRACTION W/PHACO Right 09/01/2017   Procedure: CATARACT EXTRACTION PHACO AND INTRAOCULAR LENS PLACEMENT (IOC);  Surgeon: Galen Manila, MD;  Location: [facility];  Service: Ophthalmology;  Laterality: Right;  [place] 00:30 AP% 11.7 CDE3.52 fluid pack lot #[id number]   Child Birth  1983 and 1979   COLONOSCOPY     COLONOSCOPY WITH PROPOFOL N/A 08/25/2018   Procedure: COLONOSCOPY WITH PROPOFOL;  Surgeon: Toledo, Boykin Nearing, MD;  Location: [facility];  Service: Gastroenterology;  Laterality: N/A;   ESOPHAGOGASTRODUODENOSCOPY     ESOPHAGOGASTRODUODENOSCOPY (EGD) WITH PROPOFOL N/A 08/25/2018   Procedure: ESOPHAGOGASTRODUODENOSCOPY (EGD) WITH PROPOFOL;  Surgeon: Toledo, Boykin Nearing, MD;  Location: [facility];  Service: Gastroenterology;  Laterality: N/A;   FRACTURE SURGERY     LAPAROSCOPIC SUPRACERVICAL HYSTERECTOMY  2006   ovaries left in place   left elbow     left knee     Miscarriage  1987   TONSILLECTOMY  1963   TUBAL LIGATION  1993     FAMILY HISTORY   Family History  Problem Relation Age of Onset   Heart disease Mother        Incomplete heart block   Hypertension Mother    Diabetes Mother  Nephrolithiasis Mother    Migraines Mother    Arthritis Mother        degenerative-back,osteoporosis   Heart failure Mother    Heart disease Father 57       Myocardial infarction   Heart failure Father    Heart disease Sister        h/o MI   Migraines Sister    Breast cancer Neg Hx      SOCIAL HISTORY   Social History   Tobacco Use   Smoking status: Former    Current packs/day: 0.00    Average packs/day: 0.1 packs/day for 1.5 years (0.2 ttl pk-yrs)    Types: Cigarettes    Start date: 04/1994    Quit date: 1997     Years since quitting: 27.6   Smokeless tobacco: Never  Vaping Use   Vaping status: Never Used  Substance Use Topics   Alcohol use: No   Drug use: No     MEDICATIONS    Home Medication:  Current Outpatient Rx   Order #: [id number] Class: Historical Med   Order #: [id number] Class: Normal   Order #: [id number] Class: Normal   Order #: [id number] Class: Historical Med   Order #: [id number] Class: Normal   Order #: [id number] Class: Normal   Order #: [id number] Class: Normal   Order #: [id number] Class: Normal   Order #: [id number] Class: Historical Med   Order #: [id number] Class: Normal   Order #: [id number] Class: Normal   Order #: [id number] Class: Normal   Order #: [id number] Class: Normal   Order #: [id number] Class: Normal   Order #: [id number] Class: Normal    Current Medication:  Current Outpatient Medications:    acetaminophen (TYLENOL) 650 MG CR tablet, Take 650 mg by mouth every 8 (eight) hours as needed for pain., Disp: , Rfl:    albuterol (VENTOLIN HFA) 108 (90 Base) MCG/ACT inhaler, Inhale 2 puffs into the lungs every 6 (six) hours as needed for wheezing or shortness of breath., Disp: 8 g, Rfl: 0   amLODipine (NORVASC) 10 MG tablet, Take 1 tablet (10 mg total) by mouth daily., Disp: 90 tablet, Rfl: 1   aspirin EC 81 MG tablet, Take 81 mg by mouth as needed (chest pain). Swallow whole., Disp: , Rfl:    Blood Glucose Monitoring Suppl DEVI, Use as directed to check blood sugars twice daily., Disp: 1 each, Rfl: 0   busPIRone (BUSPAR) 5 MG tablet, Take 1 tablet (5 mg total) by mouth 2 (two) times daily., Disp: 180 tablet, Rfl: 1   citalopram (CELEXA) 40 MG tablet, Take 1 tablet (40 mg total) by mouth daily., Disp: 90 tablet, Rfl: 1   cyanocobalamin (VITAMIN B12) 1000 MCG/ML injection, INJECT 1 ML INTRAMUSCULARLY ONCE EVERY MONTH, Disp: 3 mL, Rfl: 0   Fructose-Dextrose-Phosphor Acd (SB ANTI-NAUSEA PO), Take by mouth., Disp: , Rfl:    Glucose Blood (BLOOD GLUCOSE TEST STRIPS) STRP, Use as directed to  check blood sugars twice daily., Disp: 100 strip, Rfl: 11   Lancets Misc. MISC, Use as directed to check blood sugars twice daily., Disp: 100 each, Rfl: 11   NEEDLE, DISP, 25 G (B-D DISP NEEDLE 25GX1") 25G X 1" MISC, Used to give monthly B-12 injections, Disp: 50 each, Rfl: 0   rosuvastatin (CRESTOR) 40 MG tablet, Take 1 tablet (40 mg total) by mouth daily., Disp: 90 tablet, Rfl: 1   spironolactone (ALDACTONE) 25 MG tablet, Take 1 tablet by mouth once daily, Disp: 90 tablet, Rfl: 2   SYRINGE-NEEDLE, DISP, 3 ML (B-D INTEGRA SYRINGE) 25G X  5/8" 3 ML MISC, Used to draw monthly B-12 injections., Disp: 50 each, Rfl: 0    ALLERGIES   Aspartame, Celebrex [celecoxib], Imitrex [sumatriptan], Keflex [cephalexin], Nsaids, Paxil [paroxetine hcl], Percocet [oxycodone-acetaminophen], Rofecoxib, Sulfa antibiotics, and Tylenol [acetaminophen]   BP 134/78 (BP Location: Right Arm, Cuff Size: Large)   Pulse 79   Temp 98.4 F (36.9 C) (Temporal)   Ht 5\' 7"  (1.702 m)   Wt 269 lb 9.6 oz (122.3 kg)   SpO2 97%   BMI 42.23 kg/m     Review of Systems: Gen:  Denies  fever, sweats, chills weight loss  HEENT: Denies blurred vision, double vision, ear pain, eye pain, hearing loss, nose bleeds, sore throat Cardiac:  No dizziness, chest pain or heaviness, chest tightness,edema, No JVD Resp:   No cough, -sputum production, -shortness of breath,-wheezing, -hemoptysis,  Other:  All other systems negative   Physical Examination:   General Appearance: No distress  EYES PERRLA, EOM intact.   NECK Supple, No JVD Pulmonary: normal breath sounds, No wheezing.  CardiovascularNormal S1,S2.  No m/r/g.   Abdomen: Benign, Soft, non-tender. Neurology UE/LE 5/5 strength, no focal deficits Ext pulses intact, cap refill intact ALL OTHER ROS ARE NEGATIVE     IMAGING   CXR 08/2022 No acute process    ASSESSMENT/PLAN    62 year old morbidly obese white female with underlying sleep apnea with history of chronic  cough for many years along with some shortness of breath likely related to her obesity and deconditioned state     STRONGLY recommend stopping her ACE inhibitor and its alternative therapies I have advised patient to consult and contact her primary care physician office to find an alternative Since stopping her ACE inhibitor patient's cough has completely resolved No further testing needed at this point  Patient Instructions  Continue to use CPAP every night, minimum of 4-6 hours a night.  Change equipment every 30 days or as directed by DME. Wash your tubing with warm soap and water daily, hang to dry. Wash humidifier portion weekly. Use bottled, distilled water and change daily  Be aware of reduced alertness and do not drive or operate heavy machinery if experiencing this or drowsiness.  Exercise encouraged, as tolerated. Healthy weight management discussed.  Notify if persistent daytime sleepiness occurs even with consistent use of CPAP. untreated sleep apnea puts an individual at risk for cardiac arrhthymias, pulm HTN, DM, stroke and increases their risk for daytime accidents.  Continue CPAP as prescribed Patient has excellent compliance Patient use and benefits from therapy AHI significantly reduced 0.5  Obesity -recommend significant weight loss -recommend changing diet  Deconditioned state -Recommend increased daily activity and exercise   CURRENT MEDICATIONS REVIEWED AT LENGTH WITH PATIENT TODAY   Patient  satisfied with Plan of action and management. All questions answered  Follow up in 6 months  Total Time Spent  23 mins   Wallis Bamberg Santiago Glad, M.D.  [facility]  Medical Director [facility] Medical Director [facility] Cardio-Pulmonary Department

## 2023-06-12 NOTE — Patient Instructions (Signed)
 Excellent job A+  Continue CPAP as prescribed  Recommend weight loss

## 2023-06-26 ENCOUNTER — Ambulatory Visit: Payer: Managed Care, Other (non HMO) | Attending: Cardiology | Admitting: Cardiology

## 2023-06-26 ENCOUNTER — Encounter: Payer: Self-pay | Admitting: Cardiology

## 2023-06-26 VITALS — BP 142/96 | HR 84 | Ht 67.0 in | Wt 273.2 lb

## 2023-06-26 DIAGNOSIS — E7800 Pure hypercholesterolemia, unspecified: Secondary | ICD-10-CM

## 2023-06-26 DIAGNOSIS — I10 Essential (primary) hypertension: Secondary | ICD-10-CM

## 2023-06-26 DIAGNOSIS — R072 Precordial pain: Secondary | ICD-10-CM

## 2023-06-26 NOTE — Progress Notes (Signed)
Cardiology Office Note:    Date:  06/26/2023   ID:  Heather Blake, DOB 1961-08-25, MRN [id number]  PCP:  Dale Wiota, MD   [facility] Providers Cardiologist:  Debbe Odea, MD     Referring MD: Dale [place], MD   Chief Complaint  Patient presents with   Follow-up    Discuss cardiac testing.  Patient denies new or acute cardiac problems/concerns today.  Has not taken medications this morning.      History of Present Illness:    Heather Blake is a 62 y.o. female with a hx of hypertension, hyperlipidemia, GERD, anxiety who presents for follow-up.  Previously seen due to chest pain.  Echo and coronary CT was obtained to evaluate cardiac etiology.  She states pain is currently resolved, attributes pain to stress.  Husband has multiple myeloma with mets to bone.  She has not taking her BP meds this morning.  Otherwise patient feels well.  Past Medical History:  Diagnosis Date   Allergy    Anemia    Cancer (HCC)    Depression    Diverticulosis    FHx: migraine headaches    GERD (gastroesophageal reflux disease)    History of chickenpox    Hypercholesterolemia    Hyperlipidemia    Hypertension    Osteoarthritis    Panic attacks    Sleep apnea     Past Surgical History:  Procedure Laterality Date   ABDOMINAL HYSTERECTOMY     CATARACT EXTRACTION W/PHACO Left 08/11/2017   Procedure: CATARACT EXTRACTION PHACO AND INTRAOCULAR LENS PLACEMENT (IOC);  Surgeon: Galen Manila, MD;  Location: [facility] ORS;  Service: Ophthalmology;  Laterality: Left;  [place] 00:31 AP% 19.2 CDE 6.13 Fluid Pack lot # [id number]   CATARACT EXTRACTION W/PHACO Right 09/01/2017   Procedure: CATARACT EXTRACTION PHACO AND INTRAOCULAR LENS PLACEMENT (IOC);  Surgeon: Galen Manila, MD;  Location: [facility];  Service: Ophthalmology;  Laterality: Right;  [place] 00:30 AP% 11.7 CDE3.52 fluid pack lot #[id number]   Child Birth  1983 and 1981   COLONOSCOPY     COLONOSCOPY WITH PROPOFOL N/A  08/25/2018   Procedure: COLONOSCOPY WITH PROPOFOL;  Surgeon: Toledo, Boykin Nearing, MD;  Location: [facility];  Service: Gastroenterology;  Laterality: N/A;   ESOPHAGOGASTRODUODENOSCOPY     ESOPHAGOGASTRODUODENOSCOPY (EGD) WITH PROPOFOL N/A 08/25/2018   Procedure: ESOPHAGOGASTRODUODENOSCOPY (EGD) WITH PROPOFOL;  Surgeon: Toledo, Boykin Nearing, MD;  Location: [facility];  Service: Gastroenterology;  Laterality: N/A;   FRACTURE SURGERY     LAPAROSCOPIC SUPRACERVICAL HYSTERECTOMY  2006   ovaries left in place   left elbow     left knee     Miscarriage  1987   TONSILLECTOMY  1963   TUBAL LIGATION  1993    Current Medications: Current Meds  Medication Sig   acetaminophen (TYLENOL) 650 MG CR tablet Take 650 mg by mouth every 8 (eight) hours as needed for pain.   albuterol (VENTOLIN HFA) 108 (90 Base) MCG/ACT inhaler Inhale 2 puffs into the lungs every 6 (six) hours as needed for wheezing or shortness of breath.   amLODipine (NORVASC) 10 MG tablet Take 1 tablet (10 mg total) by mouth daily.   aspirin EC 81 MG tablet Take 81 mg by mouth as needed (chest pain). Swallow whole.   Blood Glucose Monitoring Suppl DEVI Use as directed to check blood sugars twice daily.   busPIRone (BUSPAR) 5 MG tablet Take 1 tablet (5 mg total) by mouth 2 (two) times daily.   citalopram (CELEXA) 40  MG tablet Take 1 tablet (40 mg total) by mouth daily.   cyanocobalamin (VITAMIN B12) 1000 MCG/ML injection INJECT 1 ML INTRAMUSCULARLY ONCE EVERY MONTH   Fructose-Dextrose-Phosphor Acd (SB ANTI-NAUSEA PO) Take by mouth.   Glucose Blood (BLOOD GLUCOSE TEST STRIPS) STRP Use as directed to check blood sugars twice daily.   Lancets Misc. MISC Use as directed to check blood sugars twice daily.   NEEDLE, DISP, 25 G (B-D DISP NEEDLE 25GX1") 25G X 1" MISC Used to give monthly B-12 injections   pantoprazole (PROTONIX) 40 MG tablet Take 40 mg by mouth daily.   rosuvastatin (CRESTOR) 40 MG tablet Take 1 tablet (40 mg total) by mouth  daily.   spironolactone (ALDACTONE) 25 MG tablet Take 1 tablet by mouth once daily   SYRINGE-NEEDLE, DISP, 3 ML (B-D INTEGRA SYRINGE) 25G X 5/8" 3 ML MISC Used to draw monthly B-12 injections.     Allergies:   Aspartame, Celebrex [celecoxib], Imitrex [sumatriptan], Keflex [cephalexin], Nsaids, Paxil [paroxetine hcl], Percocet [oxycodone-acetaminophen], Rofecoxib, Sulfa antibiotics, and Tylenol [acetaminophen]   Social History   Socioeconomic History   Marital status: Married    Spouse name: Not on file   Number of children: Not on file   Years of education: Not on file   Highest education level: Not on file  Occupational History   Not on file  Tobacco Use   Smoking status: Former    Current packs/day: 0.00    Average packs/day: 0.1 packs/day for 1.5 years (0.2 ttl pk-yrs)    Types: Cigarettes    Start date: 04/1994    Quit date: 1997    Years since quitting: 27.7   Smokeless tobacco: Never  Vaping Use   Vaping status: Never Used  Substance and Sexual Activity   Alcohol use: No   Drug use: No   Sexual activity: Not on file  Other Topics Concern   Not on file  Social History Narrative   Not on file   Social Determinants of Health   Financial Resource Strain: Not on file  Food Insecurity: Not on file  Transportation Needs: Not on file  Physical Activity: Not on file  Stress: Not on file  Social Connections: Not on file     Family History: The patient's family history includes Arthritis in her mother; Diabetes in her mother; Heart disease in her mother and sister; Heart disease (age of onset: 57) in her father; Heart failure in her father and mother; Hypertension in her mother; Migraines in her mother and sister; Nephrolithiasis in her mother. There is no history of Breast cancer.  ROS:   Please see the history of present illness.     All other systems reviewed and are negative.  EKGs/Labs/Other Studies Reviewed:    The following studies were reviewed today:        Recent Labs: 09/03/2022: Hemoglobin 13.8; Platelets 278.0 04/27/2023: ALT 37; BUN 7; Creatinine, Ser 0.63; Potassium 4.1; Sodium 137; TSH 0.610  Recent Lipid Panel    Component Value Date/Time   CHOL 214 (H) 04/27/2023 1546   TRIG 205 (H) 04/27/2023 1546   HDL 49 04/27/2023 1546   CHOLHDL 4.4 04/27/2023 1546   VLDL 41 (H) 04/27/2023 1546   LDLCALC 124 (H) 04/27/2023 1546   LDLDIRECT 73.0 12/26/2022 0810     Risk Assessment/Calculations:     Physical Exam:    VS:  BP (!) 142/96 (BP Location: Right Arm, Patient Position: Sitting, Cuff Size: Large)   Pulse 84   Ht 5'  7" (1.702 m)   Wt 273 lb 3.2 oz (123.9 kg)   SpO2 97%   BMI 42.79 kg/m     Wt Readings from Last 3 Encounters:  06/26/23 273 lb 3.2 oz (123.9 kg)  06/12/23 269 lb 9.6 oz (122.3 kg)  05/15/23 271 lb (122.9 kg)     GEN:  Well nourished, well developed in no acute distress HEENT: Normal NECK: No JVD; No carotid bruits CARDIAC: RRR, no murmurs, rubs, gallops RESPIRATORY:  Clear to auscultation without rales, wheezing or rhonchi  ABDOMEN: Soft, non-tender, non-distended MUSCULOSKELETAL:  No edema; No deformity  SKIN: Warm and dry NEUROLOGIC:  Alert and oriented x 3 PSYCHIATRIC:  Normal affect   ASSESSMENT:    1. Precordial pain   2. Primary hypertension   3. Pure hypercholesterolemia    PLAN:    In order of problems listed above:  Chest pain, coronary CT with no evidence of CAD.  Calcium score 0.  Echo with normal EF 60 to 65%. Hypertension, slightly elevated today, usually controlled.  Has not taken BP meds this a.m.  Continue Norvasc 10 mg daily, Aldactone 25 mg daily. Hyperlipidemia, continue Lipitor 40 mg daily.  Follow-up as needed.      Medication Adjustments/Labs and Tests Ordered: Current medicines are reviewed at length with the patient today.  Concerns regarding medicines are outlined above.  No orders of the defined types were placed in this encounter.  No orders of the defined  types were placed in this encounter.   Patient Instructions  Medication Instructions:   Your physician recommends that you continue on your current medications as directed. Please refer to the Current Medication list given to you today.  *If you need a refill on your cardiac medications before your next appointment, please call your pharmacy*   Lab Work:  None Ordered  If you have labs (blood work) drawn today and your tests are completely normal, you will receive your results only by: MyChart Message (if you have MyChart) OR A paper copy in the mail If you have any lab test that is abnormal or we need to change your treatment, we will call you to review the results.   Testing/Procedures:  None Ordered    Follow-Up: At [facility], you and your health needs are our priority.  As part of our continuing mission to provide you with exceptional heart care, we have created designated Provider Care Teams.  These Care Teams include your primary Cardiologist (physician) and Advanced Practice Providers (APPs -  Physician Assistants and Nurse Practitioners) who all work together to provide you with the care you need, when you need it.  We recommend signing up for the patient portal called "MyChart".  Sign up information is provided on this After Visit Summary.  MyChart is used to connect with patients for Virtual Visits (Telemedicine).  Patients are able to view lab/test results, encounter notes, upcoming appointments, etc.  Non-urgent messages can be sent to your provider as well.   To learn more about what you can do with MyChart, go to [email or web address].    Your next appointment:    As needed    Signed, Debbe Odea, MD  06/26/2023 10:06 AM    [facility]

## 2023-06-26 NOTE — Patient Instructions (Signed)
Medication Instructions:   Your physician recommends that you continue on your current medications as directed. Please refer to the Current Medication list given to you today.  *If you need a refill on your cardiac medications before your next appointment, please call your pharmacy*   Lab Work:  None Ordered  If you have labs (blood work) drawn today and your tests are completely normal, you will receive your results only by: MyChart Message (if you have MyChart) OR A paper copy in the mail If you have any lab test that is abnormal or we need to change your treatment, we will call you to review the results.   Testing/Procedures:  None Ordered   Follow-Up: At [facility], you and your health needs are our priority.  As part of our continuing mission to provide you with exceptional heart care, we have created designated Provider Care Teams.  These Care Teams include your primary Cardiologist (physician) and Advanced Practice Providers (APPs -  Physician Assistants and Nurse Practitioners) who all work together to provide you with the care you need, when you need it.  We recommend signing up for the patient portal called "MyChart".  Sign up information is provided on this After Visit Summary.  MyChart is used to connect with patients for Virtual Visits (Telemedicine).  Patients are able to view lab/test results, encounter notes, upcoming appointments, etc.  Non-urgent messages can be sent to your provider as well.   To learn more about what you can do with MyChart, go to [email or web address].    Your next appointment:    As needed

## 2023-08-03 ENCOUNTER — Other Ambulatory Visit: Payer: Self-pay | Admitting: Internal Medicine

## 2023-08-03 DIAGNOSIS — Z1231 Encounter for screening mammogram for malignant neoplasm of breast: Secondary | ICD-10-CM

## 2023-08-14 ENCOUNTER — Other Ambulatory Visit (INDEPENDENT_AMBULATORY_CARE_PROVIDER_SITE_OTHER): Payer: Managed Care, Other (non HMO)

## 2023-08-14 DIAGNOSIS — E7800 Pure hypercholesterolemia, unspecified: Secondary | ICD-10-CM

## 2023-08-14 DIAGNOSIS — R739 Hyperglycemia, unspecified: Secondary | ICD-10-CM

## 2023-08-14 DIAGNOSIS — I10 Essential (primary) hypertension: Secondary | ICD-10-CM

## 2023-08-14 LAB — CBC WITH DIFFERENTIAL/PLATELET
Basophils Absolute: 0.1 10*3/uL (ref 0.0–0.1)
Basophils Relative: 0.9 % (ref 0.0–3.0)
Eosinophils Absolute: 0.2 10*3/uL (ref 0.0–0.7)
Eosinophils Relative: 1.9 % (ref 0.0–5.0)
HCT: 42 % (ref 36.0–46.0)
Hemoglobin: 13.8 g/dL (ref 12.0–15.0)
Lymphocytes Relative: 39.1 % (ref 12.0–46.0)
Lymphs Abs: 3.2 10*3/uL (ref 0.7–4.0)
MCHC: 32.9 g/dL (ref 30.0–36.0)
MCV: 94.2 fL (ref 78.0–100.0)
Monocytes Absolute: 0.7 10*3/uL (ref 0.1–1.0)
Monocytes Relative: 9.1 % (ref 3.0–12.0)
Neutro Abs: 4 10*3/uL (ref 1.4–7.7)
Neutrophils Relative %: 49 % (ref 43.0–77.0)
Platelets: 242 10*3/uL (ref 150.0–400.0)
RBC: 4.46 Mil/uL (ref 3.87–5.11)
RDW: 13.8 % (ref 11.5–15.5)
WBC: 8.1 10*3/uL (ref 4.0–10.5)

## 2023-08-14 LAB — LIPID PANEL
Cholesterol: 146 mg/dL (ref 0–200)
HDL: 60.9 mg/dL (ref >39.00)
LDL Cholesterol: 59 mg/dL (ref 0–99)
NonHDL: 84.8
Total CHOL/HDL Ratio: 2
Triglycerides: 131 mg/dL (ref 0.0–149.0)
VLDL: 26.2 mg/dL (ref 0.0–40.0)

## 2023-08-14 LAB — HEPATIC FUNCTION PANEL
ALT: 24 U/L (ref 0–35)
AST: 26 U/L (ref 0–37)
Albumin: 4.6 g/dL (ref 3.5–5.2)
Alkaline Phosphatase: 52 U/L (ref 39–117)
Bilirubin, Direct: 0.1 mg/dL (ref 0.0–0.3)
Total Bilirubin: 0.6 mg/dL (ref 0.2–1.2)
Total Protein: 7.6 g/dL (ref 6.0–8.3)

## 2023-08-14 LAB — BASIC METABOLIC PANEL
BUN: 16 mg/dL (ref 6–23)
CO2: 29 meq/L (ref 19–32)
Calcium: 9.8 mg/dL (ref 8.4–10.5)
Chloride: 102 meq/L (ref 96–112)
Creatinine, Ser: 0.72 mg/dL (ref 0.40–1.20)
GFR: 88.36 mL/min (ref >60.00)
Glucose, Bld: 98 mg/dL (ref 70–99)
Potassium: 4.2 meq/L (ref 3.5–5.1)
Sodium: 139 meq/L (ref 135–145)

## 2023-08-14 LAB — HEMOGLOBIN A1C: Hgb A1c MFr Bld: 6.3 % (ref 4.6–6.5)

## 2023-08-18 ENCOUNTER — Encounter: Payer: Self-pay | Admitting: Internal Medicine

## 2023-08-19 ENCOUNTER — Ambulatory Visit (INDEPENDENT_AMBULATORY_CARE_PROVIDER_SITE_OTHER): Payer: Managed Care, Other (non HMO) | Admitting: Internal Medicine

## 2023-08-19 ENCOUNTER — Encounter: Payer: Self-pay | Admitting: Internal Medicine

## 2023-08-19 VITALS — BP 118/86 | HR 99 | Ht 67.0 in | Wt 274.4 lb

## 2023-08-19 DIAGNOSIS — E7800 Pure hypercholesterolemia, unspecified: Secondary | ICD-10-CM | POA: Diagnosis not present

## 2023-08-19 DIAGNOSIS — F419 Anxiety disorder, unspecified: Secondary | ICD-10-CM

## 2023-08-19 DIAGNOSIS — G4730 Sleep apnea, unspecified: Secondary | ICD-10-CM | POA: Diagnosis not present

## 2023-08-19 DIAGNOSIS — R739 Hyperglycemia, unspecified: Secondary | ICD-10-CM | POA: Diagnosis not present

## 2023-08-19 DIAGNOSIS — D649 Anemia, unspecified: Secondary | ICD-10-CM

## 2023-08-19 DIAGNOSIS — K219 Gastro-esophageal reflux disease without esophagitis: Secondary | ICD-10-CM

## 2023-08-19 DIAGNOSIS — I10 Essential (primary) hypertension: Secondary | ICD-10-CM

## 2023-08-19 DIAGNOSIS — R079 Chest pain, unspecified: Secondary | ICD-10-CM

## 2023-08-19 MED ORDER — CITALOPRAM HYDROBROMIDE 40 MG PO TABS: 40.0000 mg | ORAL_TABLET | Freq: Every day | ORAL | 1 refills | Status: DC

## 2023-08-19 MED ORDER — AMLODIPINE BESYLATE 10 MG PO TABS: 10.0000 mg | ORAL_TABLET | Freq: Every day | ORAL | 1 refills | Status: DC

## 2023-08-19 MED ORDER — BUSPIRONE HCL 5 MG PO TABS: 5.0000 mg | ORAL_TABLET | Freq: Two times a day (BID) | ORAL | 1 refills | Status: DC

## 2023-08-19 NOTE — Progress Notes (Signed)
Subjective:    [name], female    DOB: 05/09/1961, 62 y.o.   MRN: [id number]  Patient here for  Chief Complaint  Patient presents with   Medical Management of Chronic Issues    3 month follow up     HPI Here for follow up - f/u regarding hypertension and increased stress. Continues on citalopram. Saw cardiology. Recommended echo and coronary CT. Normal coronary CTA. No evidence of CAD. ECHO - normal ejection fraction and mild MR. F/u with cardiology 06/26/23 - stable.Using cpap - OSA. Overall she feels she is doing relatively well.  Handling stress.  Does not feel needs any further intervention at this time.  No chest pain reported.  Breathing stable.   Past Medical History:  Diagnosis Date   Allergy    Anemia    Cancer (HCC)    Depression    Diverticulosis    FHx: migraine headaches    GERD (gastroesophageal reflux disease)    History of chickenpox    Hypercholesterolemia    Hyperlipidemia    Hypertension    Osteoarthritis    Panic attacks    Sleep apnea    Past Surgical History:  Procedure Laterality Date   ABDOMINAL HYSTERECTOMY     CATARACT EXTRACTION W/PHACO Left 08/11/2017   Procedure: CATARACT EXTRACTION PHACO AND INTRAOCULAR LENS PLACEMENT (IOC);  Surgeon: Galen Manila, MD;  Location: [facility];  Service: Ophthalmology;  Laterality: Left;  [place] 00:31 AP% 19.2 CDE 6.13 Fluid Pack lot # [id number]   CATARACT EXTRACTION W/PHACO Right 09/01/2017   Procedure: CATARACT EXTRACTION PHACO AND INTRAOCULAR LENS PLACEMENT (IOC);  Surgeon: Galen Manila, MD;  Location: [facility];  Service: Ophthalmology;  Laterality: Right;  [place] 00:30 AP% 11.7 CDE3.52 fluid pack lot #[id number]   Child Birth  1983 and 1979   COLONOSCOPY     COLONOSCOPY WITH PROPOFOL N/A 08/25/2018   Procedure: COLONOSCOPY WITH PROPOFOL;  Surgeon: Toledo, Boykin Nearing, MD;  Location: [facility];  Service: Gastroenterology;  Laterality: N/A;   ESOPHAGOGASTRODUODENOSCOPY      ESOPHAGOGASTRODUODENOSCOPY (EGD) WITH PROPOFOL N/A 08/25/2018   Procedure: ESOPHAGOGASTRODUODENOSCOPY (EGD) WITH PROPOFOL;  Surgeon: Toledo, Boykin Nearing, MD;  Location: [facility];  Service: Gastroenterology;  Laterality: N/A;   FRACTURE SURGERY     LAPAROSCOPIC SUPRACERVICAL HYSTERECTOMY  2006   ovaries left in place   left elbow     left knee     Miscarriage  1987   TONSILLECTOMY  1963   TUBAL LIGATION  1993   Family History  Problem Relation Age of Onset   Heart disease Mother        Incomplete heart block   Hypertension Mother    Diabetes Mother    Nephrolithiasis Mother    Migraines Mother    Arthritis Mother        degenerative-back,osteoporosis   Heart failure Mother    Heart disease Father 32       Myocardial infarction   Heart failure Father    Heart disease Sister        h/o MI   Migraines Sister    Breast cancer Neg Hx    Social History   Socioeconomic History   Marital status: Married    Spouse name: Not on file   Number of children: Not on file   Years of education: Not on file   Highest education level: Not on file  Occupational History   Not on file  Tobacco Use   Smoking status: Former  Current packs/day: 0.00    Average packs/day: 0.1 packs/day for 1.5 years (0.2 ttl pk-yrs)    Types: Cigarettes    Start date: 04/1994    Quit date: 1997    Years since quitting: 27.8   Smokeless tobacco: Never  Vaping Use   Vaping status: Never Used  Substance and Sexual Activity   Alcohol use: No   Drug use: No   Sexual activity: Not on file  Other Topics Concern   Not on file  Social History Narrative   Not on file   Social Determinants of Health   Financial Resource Strain: Not on file  Food Insecurity: Not on file  Transportation Needs: Not on file  Physical Activity: Not on file  Stress: Not on file  Social Connections: Not on file     Review of Systems  Constitutional:  Negative for appetite change and unexpected weight change.  HENT:   Negative for congestion and sinus pressure.   Respiratory:  Negative for cough, chest tightness and shortness of breath.   Cardiovascular:  Negative for chest pain and palpitations.  Gastrointestinal:  Negative for abdominal pain, diarrhea, nausea and vomiting.  Genitourinary:  Negative for difficulty urinating and dysuria.  Musculoskeletal:  Negative for joint swelling and myalgias.  Skin:  Negative for color change and rash.  Neurological:  Negative for dizziness and headaches.  Psychiatric/Behavioral:  Negative for agitation and dysphoric mood.        Objective:     BP 118/86   Pulse 99   Ht 5\' 7"  (1.702 m)   Wt 274 lb 6.4 oz (124.5 kg)   SpO2 97%   BMI 42.98 kg/m  Wt Readings from Last 3 Encounters:  08/19/23 274 lb 6.4 oz (124.5 kg)  06/26/23 273 lb 3.2 oz (123.9 kg)  06/12/23 269 lb 9.6 oz (122.3 kg)    Physical Exam Vitals reviewed.  Constitutional:      General: She is not in acute distress.    Appearance: Normal appearance.  HENT:     Head: Normocephalic and atraumatic.     Right Ear: External ear normal.     Left Ear: External ear normal.  Eyes:     General: No scleral icterus.       Right eye: No discharge.        Left eye: No discharge.     Conjunctiva/sclera: Conjunctivae normal.  Neck:     Thyroid: No thyromegaly.  Cardiovascular:     Rate and Rhythm: Normal rate and regular rhythm.  Pulmonary:     Effort: No respiratory distress.     Breath sounds: Normal breath sounds. No wheezing.  Abdominal:     General: Bowel sounds are normal.     Palpations: Abdomen is soft.     Tenderness: There is no abdominal tenderness.  Musculoskeletal:        General: No swelling or tenderness.     Cervical back: Neck supple. No tenderness.  Lymphadenopathy:     Cervical: No cervical adenopathy.  Skin:    Findings: No erythema or rash.  Neurological:     Mental Status: She is alert.  Psychiatric:        Mood and Affect: Mood normal.        Behavior: Behavior  normal.      Outpatient Encounter Medications as of 08/19/2023  Medication Sig   acetaminophen (TYLENOL) 650 MG CR tablet Take 650 mg by mouth every 8 (eight) hours as needed for pain.  aspirin EC 81 MG tablet Take 81 mg by mouth as needed (chest pain). Swallow whole.   Blood Glucose Monitoring Suppl DEVI Use as directed to check blood sugars twice daily.   cyanocobalamin (VITAMIN B12) 1000 MCG/ML injection INJECT 1 ML INTRAMUSCULARLY ONCE EVERY MONTH   Fructose-Dextrose-Phosphor Acd (SB ANTI-NAUSEA PO) Take by mouth.   Glucose Blood (BLOOD GLUCOSE TEST STRIPS) STRP Use as directed to check blood sugars twice daily.   Lancets Misc. MISC Use as directed to check blood sugars twice daily.   NEEDLE, DISP, 25 G (B-D DISP NEEDLE 25GX1") 25G X 1" MISC Used to give monthly B-12 injections   pantoprazole (PROTONIX) 40 MG tablet Take 40 mg by mouth daily.   rosuvastatin (CRESTOR) 40 MG tablet Take 1 tablet (40 mg total) by mouth daily.   spironolactone (ALDACTONE) 25 MG tablet Take 1 tablet by mouth once daily   SYRINGE-NEEDLE, DISP, 3 ML (B-D INTEGRA SYRINGE) 25G X 5/8" 3 ML MISC Used to draw monthly B-12 injections.   [DISCONTINUED] amLODipine (NORVASC) 10 MG tablet Take 1 tablet (10 mg total) by mouth daily.   [DISCONTINUED] busPIRone (BUSPAR) 5 MG tablet Take 1 tablet (5 mg total) by mouth 2 (two) times daily.   [DISCONTINUED] citalopram (CELEXA) 40 MG tablet Take 1 tablet (40 mg total) by mouth daily.   albuterol (VENTOLIN HFA) 108 (90 Base) MCG/ACT inhaler Inhale 2 puffs into the lungs every 6 (six) hours as needed for wheezing or shortness of breath. (Patient not taking: Reported on 08/19/2023)   amLODipine (NORVASC) 10 MG tablet Take 1 tablet (10 mg total) by mouth daily.   busPIRone (BUSPAR) 5 MG tablet Take 1 tablet (5 mg total) by mouth 2 (two) times daily.   citalopram (CELEXA) 40 MG tablet Take 1 tablet (40 mg total) by mouth daily.   No facility-administered encounter medications on  file as of 08/19/2023.     Lab Results  Component Value Date   WBC 8.1 08/14/2023   HGB 13.8 08/14/2023   HCT 42.0 08/14/2023   PLT 242.0 08/14/2023   GLUCOSE 98 08/14/2023   CHOL 146 08/14/2023   TRIG 131.0 08/14/2023   HDL 60.90 08/14/2023   LDLDIRECT 73.0 12/26/2022   LDLCALC 59 08/14/2023   ALT 24 08/14/2023   AST 26 08/14/2023   NA 139 08/14/2023   K 4.2 08/14/2023   CL 102 08/14/2023   CREATININE 0.72 08/14/2023   BUN 16 08/14/2023   CO2 29 08/14/2023   TSH 0.610 04/27/2023   HGBA1C 6.3 08/14/2023    CT CORONARY MORPH W/CTA COR W/SCORE W/CA W/CM &/OR WO/CM  Addendum Date: 05/11/2023   ADDENDUM REPORT: 05/11/2023 07:44 EXAM: OVER-READ INTERPRETATION  CT CHEST The following report is an over-read performed by radiologist Dr. Noe Gens [facility] on 05/11/2023. This over-read does not include interpretation of cardiac or coronary anatomy or pathology. The coronary CTA interpretation by the cardiologist is attached. COMPARISON:  None. FINDINGS: Heart is normal size. Aorta normal caliber. No adenopathy. No confluent airspace opacities or effusions. No acute findings in the upper abdomen. Chest wall soft tissues are unremarkable. No acute bony abnormality. IMPRESSION: No acute or significant extracardiac abnormality Electronically Signed   By: Charlett Nose M.D.   On: 05/11/2023 07:44   Result Date: 05/11/2023 CLINICAL DATA:  Chest pain EXAM: Cardiac/Coronary  CTA TECHNIQUE: The patient was scanned on a Siemens Somatom force scanner. : A prospective scan was triggered in the ascending thoracic aorta. Axial non-contrast 3 mm slices  were carried out through the heart. The data set was analyzed on a dedicated work station and scored using the Agatson method. Gantry rotation speed was 330 msecs and collimation was .6 mm. 100mg  of metoprolol and 0.8 mg of sl NTG was given. The 3D data set was reconstructed in 5% intervals of the 60-95 % of the R-R cycle. Diastolic phases were  analyzed on a dedicated work station using MPR, MIP and VRT modes. The patient received 75 cc of contrast. FINDINGS: Aorta:  Normal size.  No calcifications.  No dissection. Aortic Valve:  Trileaflet.  No calcifications. Coronary Arteries:  Normal coronary origin.  Right dominance. RCA is a dominant artery. There is no plaque. Left main gives rise to LAD and LCX arteries. LM has no disease. LAD has no plaque. LCX is a non-dominant artery.  There is no plaque. Other findings: Normal pulmonary vein drainage into the left atrium. Normal left atrial appendage without a thrombus. Normal size of the pulmonary artery. IMPRESSION: 1. Coronary calcium score of 0. 2. Normal coronary origin with right dominance. 3. No evidence of CAD. 4. CAD-RADS 0. Consider non-atherosclerotic causes of chest pain. Electronically Signed: By: Debbe Odea M.D. On: 05/04/2023 14:22       Assessment & Plan:  Sleep apnea, unspecified type Assessment & Plan: Continue  cpap. Followed by Dr Belia Heman.    Hyperglycemia Assessment & Plan: Low carb diet and exercise.  Follow met b and A1c. Reviewed outside sugars.  Lab Results  Component Value Date   HGBA1C 6.3 08/14/2023     Hypercholesterolemia Assessment & Plan: On crestor.  Low-cholesterol diet and exercise.  Follow lipid panel liver function tests.   Lab Results  Component Value Date   CHOL 146 08/14/2023   HDL 60.90 08/14/2023   LDLCALC 59 08/14/2023   LDLDIRECT 73.0 12/26/2022   TRIG 131.0 08/14/2023   CHOLHDL 2 08/14/2023     Gastroesophageal reflux disease, unspecified whether esophagitis present Assessment & Plan: No upper symptoms reported.  On Protonix.   Essential hypertension, benign Assessment & Plan: Blood pressure doing well off lisinopril.  Remain off.  Continue aldactone.  Follow pressures.  Follow metabolic panel.  Outside blood pressures reviewed - most averaging 120-130/70s.  No changes in medication at this time.    Chest pain,  unspecified type Assessment & Plan:  Saw cardiology. Recommended echo and coronary CT. Normal coronary CTA. No evidence of CAD. ECHO - normal ejection fraction and mild MR. F/u with cardiology 06/26/23 - stable. No chest pain currently.  Follow.    Anxiety Assessment & Plan: Increased stress as outlined.  Discussed.  Overall doing better. Continue celexa. No changes. Follow.    Anemia, unspecified type Assessment & Plan: Follow cbc.    Other orders -     amLODIPine Besylate; Take 1 tablet (10 mg total) by mouth daily.  Dispense: 90 tablet; Refill: 1 -     busPIRone HCl; Take 1 tablet (5 mg total) by mouth 2 (two) times daily.  Dispense: 180 tablet; Refill: 1 -     Citalopram Hydrobromide; Take 1 tablet (40 mg total) by mouth daily.  Dispense: 90 tablet; Refill: 1     Dale [place], MD

## 2023-08-19 NOTE — Telephone Encounter (Signed)
Coming in for appt today.  Printed.

## 2023-08-23 ENCOUNTER — Encounter: Payer: Self-pay | Admitting: Internal Medicine

## 2023-08-23 NOTE — Assessment & Plan Note (Signed)
Increased stress as outlined.  Discussed.  Overall doing better. Continue celexa. No changes. Follow.

## 2023-08-23 NOTE — Assessment & Plan Note (Addendum)
Blood pressure doing well off lisinopril.  Remain off.  Continue aldactone.  Follow pressures.  Follow metabolic panel.  Outside blood pressures reviewed - most averaging 120-130/70s.  No changes in medication at this time.

## 2023-08-23 NOTE — Assessment & Plan Note (Signed)
Follow cbc.  

## 2023-08-23 NOTE — Assessment & Plan Note (Signed)
No upper symptoms reported.  On Protonix. 

## 2023-08-23 NOTE — Assessment & Plan Note (Addendum)
Low carb diet and exercise.  Follow met b and A1c. Reviewed outside sugars.  Lab Results  Component Value Date   HGBA1C 6.3 08/14/2023

## 2023-08-23 NOTE — Assessment & Plan Note (Addendum)
Continue  cpap. Followed by Dr Belia Heman.

## 2023-08-23 NOTE — Assessment & Plan Note (Addendum)
Saw cardiology. Recommended echo and coronary CT. Normal coronary CTA. No evidence of CAD. ECHO - normal ejection fraction and mild MR. F/u with cardiology 06/26/23 - stable. No chest pain currently.  Follow.

## 2023-08-23 NOTE — Assessment & Plan Note (Signed)
On crestor.  Low-cholesterol diet and exercise.  Follow lipid panel liver function tests.   Lab Results  Component Value Date   CHOL 146 08/14/2023   HDL 60.90 08/14/2023   LDLCALC 59 08/14/2023   LDLDIRECT 73.0 12/26/2022   TRIG 131.0 08/14/2023   CHOLHDL 2 08/14/2023

## 2023-08-25 ENCOUNTER — Emergency Department
Admission: EM | Admit: 2023-08-25 | Discharge: 2023-08-26 | Disposition: A | Discharge status: Home / Self Care | Payer: Managed Care, Other (non HMO) | Attending: Emergency Medicine | Admitting: Emergency Medicine

## 2023-08-25 ENCOUNTER — Emergency Department: Payer: Managed Care, Other (non HMO)

## 2023-08-25 ENCOUNTER — Other Ambulatory Visit: Payer: Self-pay

## 2023-08-25 DIAGNOSIS — W19XXXA Unspecified fall, initial encounter: Secondary | ICD-10-CM | POA: Diagnosis not present

## 2023-08-25 DIAGNOSIS — M7989 Other specified soft tissue disorders: Secondary | ICD-10-CM | POA: Insufficient documentation

## 2023-08-25 DIAGNOSIS — S52571B Other intraarticular fracture of lower end of right radius, initial encounter for open fracture type I or II: Secondary | ICD-10-CM | POA: Insufficient documentation

## 2023-08-25 DIAGNOSIS — S59911A Unspecified injury of right forearm, initial encounter: Secondary | ICD-10-CM | POA: Diagnosis present

## 2023-08-25 DIAGNOSIS — Y92000 Kitchen of unspecified non-institutional (private) residence as  the place of occurrence of the external cause: Secondary | ICD-10-CM | POA: Diagnosis not present

## 2023-08-25 DIAGNOSIS — S52691B Other fracture of lower end of right ulna, initial encounter for open fracture type I or II: Secondary | ICD-10-CM | POA: Diagnosis not present

## 2023-08-25 DIAGNOSIS — S5291XB Unspecified fracture of right forearm, initial encounter for open fracture type I or II: Secondary | ICD-10-CM

## 2023-08-25 MED ORDER — FENTANYL CITRATE PF 50 MCG/ML IJ SOSY
50.0000 ug | PREFILLED_SYRINGE | Freq: Once | INTRAMUSCULAR | Status: AC
  Administered 2023-08-25: 50 ug via INTRAVENOUS
  Filled 2023-08-25: qty 1

## 2023-08-25 MED ORDER — ONDANSETRON HCL 4 MG/2ML IJ SOLN
4.0000 mg | INTRAMUSCULAR | Status: AC
  Administered 2023-08-25: 4 mg via INTRAVENOUS
  Filled 2023-08-25: qty 2

## 2023-08-25 MED ORDER — CEFAZOLIN SODIUM-DEXTROSE 2-4 GM/100ML-% IV SOLN
2.0000 g | Freq: Three times a day (TID) | INTRAVENOUS | Status: DC
  Administered 2023-08-25: 2 g via INTRAVENOUS
  Filled 2023-08-25 (×2): qty 100

## 2023-08-25 MED ORDER — ETOMIDATE 2 MG/ML IV SOLN
10.0000 mg | Freq: Once | INTRAVENOUS | Status: AC
  Administered 2023-08-26: 10 mg via INTRAVENOUS
  Filled 2023-08-25: qty 10

## 2023-08-25 NOTE — ED Triage Notes (Addendum)
Pt reports slipping on water and falling onto right forearm. Pt had deformity to right wrist noted to fingers.

## 2023-08-26 ENCOUNTER — Emergency Department: Payer: Managed Care, Other (non HMO)

## 2023-08-26 MED ORDER — PROPOFOL 10 MG/ML IV BOLUS
INTRAVENOUS | Status: AC
  Filled 2023-08-26: qty 20

## 2023-08-26 MED ORDER — HYDROCODONE-ACETAMINOPHEN 5-325 MG PO TABS: 2.0000 | ORAL_TABLET | Freq: Three times a day (TID) | ORAL | 0 refills | Status: DC | PRN

## 2023-08-26 MED ORDER — DOCUSATE SODIUM 100 MG PO CAPS: ORAL_CAPSULE | ORAL | 0 refills | Status: DC

## 2023-08-26 MED ORDER — ONDANSETRON 4 MG PO TBDP: ORAL_TABLET | ORAL | 0 refills | Status: DC

## 2023-08-26 MED ORDER — CEPHALEXIN 500 MG PO CAPS: 500.0000 mg | ORAL_CAPSULE | Freq: Four times a day (QID) | ORAL | 0 refills | Status: AC

## 2023-08-26 NOTE — ED Provider Notes (Incomplete)
[facility] Provider Note    Event Date/Time   First MD Initiated Contact with Patient 08/25/23 2256     (approximate)   History   Fall   HPI Heather Blake is a 62 y.o. female who presents for evaluation of pain in her right forearm.  She had a fall on some water in her kitchen and landed on her outstretched arm.  It is misshapened and severely painful with any amount of movement.  The pain seems to radiate throughout her arm but is primarily located near her wrist.  She can move her fingers but it hurts to do so.  There is some swelling and bruising.  She also has a small laceration or abrasion on the inner part of her wrist which she did not have before the fall.     Physical Exam   Triage Vital Signs: ED Triage Vitals  Encounter Vitals Group     BP 08/25/23 2124 (!) 132/117     Systolic BP Percentile --      Diastolic BP Percentile --      Pulse Rate 08/25/23 2124 68     Resp 08/25/23 2124 20     Temp 08/25/23 2124 98.2 F (36.8 C)     Temp Source 08/25/23 2124 Oral     SpO2 08/25/23 2124 100 %     Weight 08/25/23 2123 124.3 kg (274 lb)     Height 08/25/23 2123 1.702 m (5\' 7" )     Head Circumference --      Peak Flow --      Pain Score 08/25/23 2123 10     Pain Loc --      Pain Education --      Exclude from Growth Chart --     Most recent vital signs: Vitals:   08/25/23 2124 08/25/23 2330  BP: (!) 132/117 110/85  Pulse: 68 (!) 56  Resp: 20 (!) 21  Temp: 98.2 F (36.8 C)   SpO2: 100% 97%    General: Awake, appears very uncomfortable from pain. CV:  Good peripheral perfusion.  Easily palpable right radial pulse. Resp:  Normal effort. Speaking easily and comfortably, no accessory muscle usage nor intercostal retractions.   Abd:  No distention.  Obese. Other:  Deformity to the right distal forearm consistent with displaced fracture.  She also has a less than 1 cm laceration on the palmar aspect of her right wrist worrisome for open  fracture.   ED Results / Procedures / Treatments   Labs (all labs ordered are listed, but only abnormal results are displayed) Labs Reviewed - No data to display   EKG  ***   RADIOLOGY ***   PROCEDURES:  Critical Care performed: {CriticalCareYesNo:19197::"Yes, see critical care procedure note(s)","No"}  Procedures    IMPRESSION / MDM / ASSESSMENT AND PLAN / ED COURSE  I reviewed the triage vital signs and the nursing notes.                              Differential diagnosis includes, but is not limited to, ***  Patient's presentation is most consistent with {EM COPA:27473}  Labs/studies ordered: ***  Interventions/Medications given:  Medications  ceFAZolin (ANCEF) IVPB 2g/100 mL premix (2 g Intravenous New Bag/Given 08/25/23 2347)  etomidate (AMIDATE) injection 10 mg (has no administration in time range)  fentaNYL (SUBLIMAZE) injection 50 mcg (50 mcg Intravenous Given 08/25/23 2255)  ondansetron (ZOFRAN) injection  4 mg (4 mg Intravenous Given 08/25/23 2342)  fentaNYL (SUBLIMAZE) injection 50 mcg (50 mcg Intravenous Given 08/25/23 2343)   *** (Note:  hospital course my include additional interventions and/or labs/studies not listed above.)   ***  {**The patient is on the cardiac monitor to evaluate for evidence of arrhythmia and/or significant heart rate changes.**}       FINAL CLINICAL IMPRESSION(S) / ED DIAGNOSES   Final diagnoses:  None     Rx / DC Orders   ED Discharge Orders     None        Note:  This document was prepared using Dragon voice recognition software and may include unintentional dictation errors.

## 2023-08-26 NOTE — Discharge Instructions (Addendum)
As we discussed, you will need to follow-up with orthopedics (likely next week) for a follow-up appointment to discuss long-term management of the severe fracture in your right forearm.  You will likely need surgery to fix the injury.  Please call the office of Dr. Joice Lofts in the morning to schedule a follow-up appointment; explain you were seen overnight in the emergency department for a forearm fracture and that Dr. Joice Lofts asked you to call to schedule an appointment.  Because we believe that the bone poked through the skin, it is important that you take the full course of antibiotics prescribed.  Use over-the-counter ibuprofen and Tylenol according to label instructions for pain relief.  You can also use cold packs over the splint.  Take Percocet as prescribed for severe pain. Do not drink alcohol, drive or participate in any other potentially dangerous activities while taking this medication as it may make you sleepy. Do not take this medication with any other sedating medications, either prescription or over-the-counter. If you were prescribed Percocet or Vicodin, do not take these with acetaminophen (Tylenol) as it is already contained within these medications.   This medication is an opiate (or narcotic) pain medication and can be habit forming.  Use it as little as possible to achieve adequate pain control.  Do not use or use it with extreme caution if you have a history of opiate abuse or dependence.  If you are on a pain contract with your primary care doctor or a pain specialist, be sure to let them know you were prescribed this medication today from the [facility] Emergency Department.  This medication is intended for your use only - do not give any to anyone else and keep it in a secure place where nobody else, especially children, have access to it.  It will also cause or worsen constipation, so you may want to consider taking an over-the-counter stool softener while you are taking this  medication.  Return immediately to the emergency department if your pain increases substantially or you notice you are not able to move or feel your fingers, if you can tell that the swelling is gotten much worse, or if you develop other new or worsening symptoms that concern you.

## 2023-08-26 NOTE — ED Provider Notes (Signed)
[facility] Provider Note    Event Date/Time   First MD Initiated Contact with Patient 08/25/23 2256     (approximate)   History   Fall   HPI Heather Blake is a 62 y.o. female who presents for evaluation of pain in her right forearm.  She had a fall on some water in her kitchen and landed on her outstretched arm.  It is misshapened and severely painful with any amount of movement.  The pain seems to radiate throughout her arm but is primarily located near her wrist.  She can move her fingers but it hurts to do so.  There is some swelling and bruising.  She also has a small laceration or abrasion on the inner part of her wrist which she did not have before the fall.     Physical Exam   Triage Vital Signs: ED Triage Vitals  Encounter Vitals Group     BP 08/25/23 2124 (!) 132/117     Systolic BP Percentile --      Diastolic BP Percentile --      Pulse Rate 08/25/23 2124 68     Resp 08/25/23 2124 20     Temp 08/25/23 2124 98.2 F (36.8 C)     Temp Source 08/25/23 2124 Oral     SpO2 08/25/23 2124 100 %     Weight 08/25/23 2123 124.3 kg (274 lb)     Height 08/25/23 2123 1.702 m (5\' 7" )     Head Circumference --      Peak Flow --      Pain Score 08/25/23 2123 10     Pain Loc --      Pain Education --      Exclude from Growth Chart --     Most recent vital signs: Vitals:   08/26/23 0115 08/26/23 0130  BP: 129/72 129/71  Pulse: (!) 59 61  Resp: (!) 24 20  Temp:    SpO2: 99% 98%    General: Awake, appears very uncomfortable from pain. CV:  Good peripheral perfusion.  Easily palpable right radial pulse. Resp:  Normal effort. Speaking easily and comfortably, no accessory muscle usage nor intercostal retractions.   Abd:  No distention.  Obese. Other:  Deformity to the right distal forearm consistent with displaced fracture.  She also has a less than 1 cm laceration on the volar aspect of her right wrist worrisome for open fracture.   ED  Results / Procedures / Treatments   Labs (all labs ordered are listed, but only abnormal results are displayed) Labs Reviewed - No data to display    RADIOLOGY I viewed and interpreted the patient's right wrist, hand, and forearm x-rays.  The patient has a severely displaced and telescoping injury to the distal radius and ulna with about 4 cm of overriding and some lateral displacement.  Official radiology interpretation is not available at the time of care due to radiologist staffing limitations.   PROCEDURES:  Critical Care performed: No  .Sedation  Date/Time: 08/25/2023 11:30 PM  Performed by: Loleta Rose, MD Authorized by: Loleta Rose, MD   Consent:    Consent obtained:  Written and verbal   Consent given by:  Patient and spouse   Risks discussed:  Prolonged hypoxia resulting in organ damage, prolonged sedation necessitating reversal, dysrhythmia, inadequate sedation, nausea, vomiting and respiratory compromise necessitating ventilatory assistance and intubation   Alternatives discussed:  Analgesia without sedation Universal protocol:    Immediately prior to  procedure, a time out was called: yes     Patient identity confirmed:  Anonymous protocol, patient vented/unresponsive and arm band Indications:    Procedure performed:  Fracture reduction   Procedure necessitating sedation performed by:  Physician performing sedation Pre-sedation assessment:    Time since last food or drink:  5 hours   ASA classification: class 2 - patient with mild systemic disease     Mallampati score:  II - soft palate, uvula, fauces visible   Neck mobility: normal     Pre-sedation assessments completed and reviewed: airway patency, cardiovascular function, hydration status, mental status, nausea/vomiting, pain level, respiratory function and temperature     Pre-sedation assessment completed:  08/25/2023 11:35 PM Immediate pre-procedure details:    Reassessment: Patient reassessed immediately  prior to procedure     Reviewed: vital signs, relevant labs/tests and NPO status     Verified: bag valve mask available, emergency equipment available, intubation equipment available, IV patency confirmed, oxygen available and suction available   Procedure details (see MAR for exact dosages):    Preoxygenation:  Nasal cannula   Sedation:  Etomidate   Intended level of sedation: deep   Analgesia:  Fentanyl   Intra-procedure monitoring:  Blood pressure monitoring, cardiac monitor, continuous capnometry, continuous pulse oximetry, frequent vital sign checks and frequent LOC assessments   Total Provider sedation time (minutes):  18 Post-procedure details:    Post-sedation assessment completed:  08/26/2023 1:36 AM   Attendance: Constant attendance by certified staff until patient recovered     Recovery: Patient returned to pre-procedure baseline     Post-sedation assessments completed and reviewed: airway patency, cardiovascular function, hydration status, mental status, nausea/vomiting, pain level, respiratory function and temperature     Procedure completion:  Tolerated well, no immediate complications .Ortho Injury Treatment  Date/Time: 08/26/2023 12:10 AM  Performed by: Loleta Rose, MD Authorized by: Loleta Rose, MD   Consent:    Consent obtained:  Written and verbal   Consent given by:  Patient and spouse   Risks discussed:  Fracture   Alternatives discussed:  Delayed treatment and immobilizationInjury location: wrist Location details: right wrist Injury type: fracture Fracture type: distal radius Pre-procedure distal perfusion: diminished Pre-procedure neurological function: diminished Pre-procedure range of motion: reduced  Patient sedated: Yes. Refer to sedation procedure documentation for details of sedation. Manipulation performed: yes Skeletal traction used: yes Reduction successful: yes (great improvement, not perfect alignment) X-ray confirmed reduction:  yes Immobilization: splint Splint type: sugar tong Splint Applied by: ED Provider and ED Tech Supplies used: Ortho-Glass Post-procedure neurovascular assessment: post-procedure neurovascularly intact Post-procedure distal perfusion: normal Post-procedure neurological function: normal Post-procedure range of motion: unchanged Comments: The hand appeared swollen and ecchymotic prior to the reduction but the color improved after reduction, suggesting to me that besides a small amount of ecchymosis, she had decreased perfusion despite the extremity being warm and her capillary refill being essentially normal.   .1-3 Lead EKG Interpretation  Performed by: Loleta Rose, MD Authorized by: Loleta Rose, MD     Interpretation: normal     ECG rate:  70   ECG rate assessment: normal     Rhythm: sinus rhythm     Ectopy: none     Conduction: normal       IMPRESSION / MDM / ASSESSMENT AND PLAN / ED COURSE  I reviewed the triage vital signs and the nursing notes.  Differential diagnosis includes, but is not limited to, fracture, dislocation, open fracture.  Patient's presentation is most consistent with acute presentation with potential threat to life or bodily function.  Labs/studies ordered: X-rays of right wrist, forearm, and hand  Interventions/Medications given:  Medications  fentaNYL (SUBLIMAZE) injection 50 mcg (50 mcg Intravenous Given 08/25/23 2255)  ondansetron (ZOFRAN) injection 4 mg (4 mg Intravenous Given 08/25/23 2342)  etomidate (AMIDATE) injection 10 mg (10 mg Intravenous Given 08/26/23 0048)  fentaNYL (SUBLIMAZE) injection 50 mcg (50 mcg Intravenous Given 08/25/23 2343)    (Note:  hospital course my include additional interventions and/or labs/studies not listed above.)   Severely displaced and overriding distal radius and ulna fractures, suspect at least mild neurovascular compromise given the tingling she is reporting in her fingers  and the slight ecchymotic color even though I can palpate a radial pulse and her hand is warm.  Regardless, she will require reduction and I had my usual and customary risks and benefits discussion for both deep sedation and orthopedic reduction.  She and her husband consent to the process.  Patient required fentanyl 50 mcg IV upon being roomed, and I ordered an additional 50 mcg IV because her pain was still 10 out of 10.  I am placing her on nasal cannula oxygen 2 L to preoxygenate for the sedation and reduction.  I will use etomidate 10 mg IV because I think it will provide better short-term sedation than propofol given her body habitus and the fat-soluble nature of propofol.  The patient is on the cardiac monitor to evaluate for evidence of arrhythmia and/or significant heart rate changes.   Clinical Course as of 08/26/23 0159  Wed Aug 26, 2023  4132 Radiology reports confirmed findings that I documented in my radiology interpretation section. [CF]  0114 DG Wrist Complete Right I viewed and interpreted the post-reduction radiographs, and the alignment is better with minimal overriding.    Paging Dr. Joice Lofts to discuss. [CF]  4401 Consulted with Dr. Joice Lofts with orthopedics.  He agreed with the management plan including the IV antibiotics and plan for discharge and close follow-up with a course of antibiotics (cephalexin) and pain medication. [CF]  0136 Patient is completely back to baseline and says she is ready to go home.  I went over the management recommendations and return precautions including precautions should she develop worsening pain or swelling.  She and her husband understand and agree with the plan.  I verified in the [place] controlled substance database that she has no prescribing habits that are concerning and wrote prescriptions as listed below. [CF]    Clinical Course User Index [CF] Loleta Rose, MD     FINAL CLINICAL IMPRESSION(S) / ED DIAGNOSES   Final diagnoses:   Type I or II open fracture of right forearm, initial encounter     Rx / DC Orders   ED Discharge Orders          Ordered    cephALEXin (KEFLEX) 500 MG capsule  4 times daily        08/26/23 0149    ondansetron (ZOFRAN-ODT) 4 MG disintegrating tablet        08/26/23 0149    HYDROcodone-acetaminophen (NORCO/VICODIN) 5-325 MG tablet  Every 8 hours PRN        08/26/23 0149    docusate sodium (COLACE) 100 MG capsule        08/26/23 0149             Note:  This  document was prepared using Conservation officer, historic buildings and may include unintentional dictation errors.   Loleta Rose, MD 08/26/23 [phone]

## 2023-08-27 ENCOUNTER — Encounter: Payer: Self-pay | Admitting: Internal Medicine

## 2023-08-27 ENCOUNTER — Encounter: Payer: Self-pay | Admitting: Orthopedic Surgery

## 2023-08-27 ENCOUNTER — Other Ambulatory Visit: Payer: Self-pay | Admitting: Orthopedic Surgery

## 2023-08-27 NOTE — Anesthesia Preprocedure Evaluation (Addendum)
Anesthesia Evaluation  Patient identified by MRN, date of birth, ID band Patient awake    Reviewed: Allergy & Precautions, H&P , NPO status , Patient's Chart, lab work & pertinent test results  Airway Mallampati: III  TM Distance: <3 FB Neck ROM: Full    Dental no notable dental hx.    Pulmonary sleep apnea , former smoker   Pulmonary exam normal breath sounds clear to auscultation       Cardiovascular hypertension, Normal cardiovascular exam Rhythm:Regular Rate:Normal     Neuro/Psych  PSYCHIATRIC DISORDERS Anxiety Depression    Note hx panic attacks, anxiety, depression Vertigo, motion sickness  negative neurological ROS  negative psych ROS   GI/Hepatic negative GI ROS, Neg liver ROS,GERD  ,,  Endo/Other  negative endocrine ROS    Renal/GU negative Renal ROS  negative genitourinary   Musculoskeletal negative musculoskeletal ROS (+) Arthritis ,    Abdominal   Peds negative pediatric ROS (+)  Hematology negative hematology ROS (+) Blood dyscrasia, anemia   Anesthesia Other Findings Hypercholesterolemia Diverticulosis Anemia Hypertension Depression Allergy Osteoarthritis FHx: migraine headaches GERD (gastroesophageal reflux disease) Panic attacks Hyperlipidemia Cancer (HCC) Sleep apnea with CPAP Vertigo Motion sickness Morbid obesity   Reproductive/Obstetrics negative OB ROS                              Anesthesia Physical Anesthesia Plan  ASA: 3  Anesthesia Plan: General   Post-op Pain Management:    Induction: Intravenous  PONV Risk Score and Plan:   Airway Management Planned: LMA  Additional Equipment:   Intra-op Plan:   Post-operative Plan: Extubation in OR  Informed Consent: I have reviewed the patients History and Physical, chart, labs and discussed the procedure including the risks, benefits and alternatives for the proposed anesthesia with the patient  or authorized representative who has indicated his/her understanding and acceptance.     Dental Advisory Given  Plan Discussed with: Anesthesiologist, CRNA and Surgeon  Anesthesia Plan Comments: (Patient consented for risks of anesthesia including but not limited to:  - adverse reactions to medications - damage to eyes, teeth, lips or other oral mucosa - nerve damage due to positioning  - sore throat or hoarseness - Damage to heart, brain, nerves, lungs, other parts of body or loss of life  Patient voiced understanding and assent. Discussed risks/benefits regional anesthesia nerve block)        Anesthesia Quick Evaluation

## 2023-08-28 ENCOUNTER — Ambulatory Visit: Payer: Managed Care, Other (non HMO) | Admitting: Anesthesiology

## 2023-08-28 ENCOUNTER — Other Ambulatory Visit: Payer: Self-pay

## 2023-08-28 ENCOUNTER — Encounter
Admission: RE | Disposition: A | Discharge status: Home / Self Care | Payer: Self-pay | Source: Home / Self Care | Attending: Orthopedic Surgery

## 2023-08-28 ENCOUNTER — Ambulatory Visit: Payer: Self-pay

## 2023-08-28 ENCOUNTER — Ambulatory Visit
Admission: RE | Admit: 2023-08-28 | Discharge: 2023-08-28 | Disposition: A | Discharge status: Home / Self Care | Payer: Managed Care, Other (non HMO) | Attending: Orthopedic Surgery | Admitting: Orthopedic Surgery

## 2023-08-28 ENCOUNTER — Other Ambulatory Visit: Payer: Self-pay | Admitting: Internal Medicine

## 2023-08-28 ENCOUNTER — Encounter: Payer: Self-pay | Admitting: Orthopedic Surgery

## 2023-08-28 DIAGNOSIS — I10 Essential (primary) hypertension: Secondary | ICD-10-CM | POA: Insufficient documentation

## 2023-08-28 DIAGNOSIS — F419 Anxiety disorder, unspecified: Secondary | ICD-10-CM | POA: Diagnosis not present

## 2023-08-28 DIAGNOSIS — F32A Depression, unspecified: Secondary | ICD-10-CM | POA: Diagnosis not present

## 2023-08-28 DIAGNOSIS — Z79899 Other long term (current) drug therapy: Secondary | ICD-10-CM | POA: Diagnosis not present

## 2023-08-28 DIAGNOSIS — S52571A Other intraarticular fracture of lower end of right radius, initial encounter for closed fracture: Secondary | ICD-10-CM | POA: Insufficient documentation

## 2023-08-28 DIAGNOSIS — G5601 Carpal tunnel syndrome, right upper limb: Secondary | ICD-10-CM | POA: Insufficient documentation

## 2023-08-28 DIAGNOSIS — G4730 Sleep apnea, unspecified: Secondary | ICD-10-CM | POA: Insufficient documentation

## 2023-08-28 DIAGNOSIS — Z87891 Personal history of nicotine dependence: Secondary | ICD-10-CM | POA: Insufficient documentation

## 2023-08-28 DIAGNOSIS — M1990 Unspecified osteoarthritis, unspecified site: Secondary | ICD-10-CM | POA: Diagnosis not present

## 2023-08-28 DIAGNOSIS — D649 Anemia, unspecified: Secondary | ICD-10-CM | POA: Insufficient documentation

## 2023-08-28 DIAGNOSIS — S52601A Unspecified fracture of lower end of right ulna, initial encounter for closed fracture: Secondary | ICD-10-CM | POA: Diagnosis not present

## 2023-08-28 DIAGNOSIS — W010XXA Fall on same level from slipping, tripping and stumbling without subsequent striking against object, initial encounter: Secondary | ICD-10-CM | POA: Diagnosis not present

## 2023-08-28 DIAGNOSIS — K219 Gastro-esophageal reflux disease without esophagitis: Secondary | ICD-10-CM | POA: Insufficient documentation

## 2023-08-28 DIAGNOSIS — W19XXXA Unspecified fall, initial encounter: Secondary | ICD-10-CM | POA: Diagnosis not present

## 2023-08-28 HISTORY — PX: OPEN REDUCTION INTERNAL FIXATION (ORIF) DISTAL RADIAL FRACTURE: SHX5989

## 2023-08-28 HISTORY — DX: Obstructive sleep apnea (adult) (pediatric): G47.33

## 2023-08-28 HISTORY — DX: Morbid (severe) obesity due to excess calories: E66.01

## 2023-08-28 HISTORY — DX: Dizziness and giddiness: R42

## 2023-08-28 HISTORY — DX: Motion sickness, initial encounter: T75.3XXA

## 2023-08-28 LAB — GLUCOSE, CAPILLARY: Glucose-Capillary: 108 mg/dL — ABNORMAL HIGH (ref 70–99)

## 2023-08-28 SURGERY — OPEN REDUCTION INTERNAL FIXATION (ORIF) DISTAL RADIUS FRACTURE
Anesthesia: General | Site: Wrist | Laterality: Right

## 2023-08-28 MED ORDER — 0.9 % SODIUM CHLORIDE (POUR BTL) OPTIME
TOPICAL | Status: DC | PRN
  Administered 2023-08-28: 1000 mL

## 2023-08-28 MED ORDER — DEXAMETHASONE SODIUM PHOSPHATE 10 MG/ML IJ SOLN
INTRAMUSCULAR | Status: DC | PRN
  Administered 2023-08-28: 10 mg

## 2023-08-28 MED ORDER — BUPIVACAINE HCL 0.25 % IJ SOLN
INTRAMUSCULAR | Status: AC
  Filled 2023-08-28: qty 1

## 2023-08-28 MED ORDER — MIDAZOLAM HCL 5 MG/5ML IJ SOLN
INTRAMUSCULAR | Status: DC | PRN
  Administered 2023-08-28: 2 mg via INTRAVENOUS

## 2023-08-28 MED ORDER — MIDAZOLAM HCL 2 MG/2ML IJ SOLN
2.0000 mg | Freq: Once | INTRAMUSCULAR | Status: AC
  Administered 2023-08-28: 2 mg via INTRAVENOUS

## 2023-08-28 MED ORDER — DEXAMETHASONE SODIUM PHOSPHATE 4 MG/ML IJ SOLN
INTRAMUSCULAR | Status: AC
  Filled 2023-08-28: qty 1

## 2023-08-28 MED ORDER — LIDOCAINE HCL (PF) 2 % IJ SOLN
INTRAMUSCULAR | Status: AC
  Filled 2023-08-28: qty 5

## 2023-08-28 MED ORDER — CEFAZOLIN SODIUM 1 G IJ SOLR
INTRAMUSCULAR | Status: AC
Start: 2023-08-28 — End: ?
  Filled 2023-08-28: qty 10

## 2023-08-28 MED ORDER — HALOPERIDOL LACTATE 5 MG/ML IJ SOLN: 2.0000 mg | Freq: Once | INTRAMUSCULAR | Status: DC

## 2023-08-28 MED ORDER — CEFAZOLIN SODIUM 1 G IJ SOLR
INTRAMUSCULAR | Status: AC
  Filled 2023-08-28: qty 10

## 2023-08-28 MED ORDER — FLUMAZENIL 0.5 MG/5ML IV SOLN
INTRAVENOUS | Status: AC
  Filled 2023-08-28: qty 5

## 2023-08-28 MED ORDER — DEXAMETHASONE SODIUM PHOSPHATE 10 MG/ML IJ SOLN
INTRAMUSCULAR | Status: AC
  Filled 2023-08-28: qty 1

## 2023-08-28 MED ORDER — SCOPOLAMINE 1 MG/3DAYS TD PT72: 1.0000 | MEDICATED_PATCH | Freq: Once | TRANSDERMAL | Status: DC

## 2023-08-28 MED ORDER — LIDOCAINE-EPINEPHRINE 1 %-1:100000 IJ SOLN
INTRAMUSCULAR | Status: AC
  Filled 2023-08-28: qty 1

## 2023-08-28 MED ORDER — DEXAMETHASONE SODIUM PHOSPHATE 4 MG/ML IJ SOLN
INTRAMUSCULAR | Status: DC | PRN
  Administered 2023-08-28: 4 mg via INTRAVENOUS

## 2023-08-28 MED ORDER — FENTANYL CITRATE (PF) 100 MCG/2ML IJ SOLN
100.0000 ug | Freq: Once | INTRAMUSCULAR | Status: AC
  Administered 2023-08-28: 50 ug via INTRAVENOUS

## 2023-08-28 MED ORDER — FENTANYL CITRATE (PF) 100 MCG/2ML IJ SOLN
INTRAMUSCULAR | Status: DC | PRN
  Administered 2023-08-28: 50 ug via INTRAVENOUS

## 2023-08-28 MED ORDER — FENTANYL CITRATE (PF) 100 MCG/2ML IJ SOLN
INTRAMUSCULAR | Status: AC
  Filled 2023-08-28: qty 2

## 2023-08-28 MED ORDER — FLUMAZENIL NICU IV SYRINGE 0.1 MG/ML
0.2000 mg | INTRAVENOUS | Status: DC | PRN
  Administered 2023-08-28 (×3): 0.2 mg via INTRAVENOUS

## 2023-08-28 MED ORDER — MIDAZOLAM HCL 2 MG/2ML IJ SOLN
INTRAMUSCULAR | Status: AC
  Filled 2023-08-28: qty 2

## 2023-08-28 MED ORDER — ONDANSETRON HCL 4 MG/2ML IJ SOLN
INTRAMUSCULAR | Status: DC | PRN
  Administered 2023-08-28: 4 mg via INTRAVENOUS

## 2023-08-28 MED ORDER — BUPIVACAINE HCL (PF) 0.5 % IJ SOLN
INTRAMUSCULAR | Status: DC | PRN
  Administered 2023-08-28: 20 mL

## 2023-08-28 MED ORDER — LACTATED RINGERS IV SOLN: INTRAVENOUS | Status: DC

## 2023-08-28 MED ORDER — AMISULPRIDE (ANTIEMETIC) 5 MG/2ML IV SOLN: 10.0000 mg | Freq: Once | INTRAVENOUS | Status: DC

## 2023-08-28 MED ORDER — ONDANSETRON HCL 4 MG/2ML IJ SOLN
INTRAMUSCULAR | Status: AC
  Filled 2023-08-28: qty 2

## 2023-08-28 MED ORDER — LIDOCAINE HCL (CARDIAC) PF 100 MG/5ML IV SOSY
PREFILLED_SYRINGE | INTRAVENOUS | Status: DC | PRN
  Administered 2023-08-28: 50 mg via INTRATRACHEAL

## 2023-08-28 MED ORDER — LIDOCAINE-EPINEPHRINE 2 %-1:100000 IJ SOLN
INTRAMUSCULAR | Status: DC | PRN
  Administered 2023-08-28: 10 mL via SUBCUTANEOUS
  Administered 2023-08-28: 2 mL via SUBCUTANEOUS

## 2023-08-28 MED ORDER — SODIUM CHLORIDE 0.9% FLUSH: 3.0000 mL | INTRAVENOUS | Status: DC | PRN

## 2023-08-28 MED ORDER — HYDROCODONE-ACETAMINOPHEN 5-325 MG PO TABS: 2.0000 | ORAL_TABLET | Freq: Four times a day (QID) | ORAL | 0 refills | Status: DC | PRN

## 2023-08-28 MED ORDER — ACETAMINOPHEN 10 MG/ML IV SOLN: INTRAVENOUS | Status: DC | PRN

## 2023-08-28 MED ORDER — PROPOFOL 10 MG/ML IV BOLUS
INTRAVENOUS | Status: AC
  Filled 2023-08-28: qty 20

## 2023-08-28 MED ORDER — PROPOFOL 10 MG/ML IV BOLUS
INTRAVENOUS | Status: DC | PRN
  Administered 2023-08-28: 170 mg via INTRAVENOUS

## 2023-08-28 SURGICAL SUPPLY — 38 items
BIT DRILL 2 FAST STEP (BIT) IMPLANT
BIT DRILL 2.5X4 QC (BIT) IMPLANT
BNDG ELASTIC 3X5.8 VLCR NS LF (GAUZE/BANDAGES/DRESSINGS) ×1 IMPLANT
BNDG ELASTIC 4X5.8 VLCR NS LF (GAUZE/BANDAGES/DRESSINGS) ×1 IMPLANT
CHLORAPREP W/TINT 26 (MISCELLANEOUS) ×1 IMPLANT
COVER LIGHT HANDLE UNIVERSAL (MISCELLANEOUS) ×2 IMPLANT
DRAPE FLUOR MINI C-ARM 54X84 (DRAPES) ×1 IMPLANT
ELECT REM PT RETURN 9FT ADLT (ELECTROSURGICAL) ×1
ELECTRODE REM PT RTRN 9FT ADLT (ELECTROSURGICAL) ×1 IMPLANT
GAUZE SPONGE 4X4 12PLY STRL (GAUZE/BANDAGES/DRESSINGS) ×1 IMPLANT
GAUZE XEROFORM 1X8 LF (GAUZE/BANDAGES/DRESSINGS) ×1 IMPLANT
GLOVE SURG SYN 9.0 PF PI (GLOVE) ×1 IMPLANT
GOWN STRL REIN 2XL XLG LVL4 (GOWN DISPOSABLE) ×2 IMPLANT
GOWN STRL REUS W/ TWL LRG LVL3 (GOWN DISPOSABLE) ×1 IMPLANT
GOWN STRL REUS W/TWL LRG LVL3 (GOWN DISPOSABLE) ×1
K-WIRE 1.6 (WIRE) ×2
K-WIRE FX5X1.6XNS BN SS (WIRE) ×2
KIT TURNOVER KIT A (KITS) ×1 IMPLANT
KWIRE FX5X1.6XNS BN SS (WIRE) IMPLANT
NS IRRIG 500ML POUR BTL (IV SOLUTION) ×1 IMPLANT
PACK EXTREMITY ARMC (MISCELLANEOUS) ×1 IMPLANT
PAD CAST 4YDX4 CTTN HI CHSV (CAST SUPPLIES) ×1 IMPLANT
PADDING CAST BLEND 3X4 STRL (MISCELLANEOUS) ×2 IMPLANT
PADDING CAST COTTON 4X4 STRL (CAST SUPPLIES) ×1
PEG SUBCHONDRAL SMOOTH 2.0X14 (Peg) IMPLANT
PEG SUBCHONDRAL SMOOTH 2.0X20 (Peg) IMPLANT
PEG SUBCHONDRAL SMOOTH 2.0X22 (Peg) IMPLANT
PEG SUBCHONDRAL SMOOTH 2.0X24 (Peg) IMPLANT
PLATE SHORT 24.4X51.3 RT (Plate) IMPLANT
SCREW BN 12X3.5XNS CORT TI (Screw) IMPLANT
SCREW CORT 3.5X10 LNG (Screw) IMPLANT
SCREW CORT 3.5X12 (Screw) ×2 IMPLANT
SCREW MULTI DIRECT 20MM (Screw) IMPLANT
SPLINT CAST 1 STEP 3X12 (MISCELLANEOUS) ×1 IMPLANT
SUT ETHILON 4-0 (SUTURE) ×2
SUT ETHILON 4-0 FS2 18XMFL BLK (SUTURE) ×2
SUT VICRYL 3-0 27IN (SUTURE) ×1 IMPLANT
SUTURE ETHLN 4-0 FS2 18XMF BLK (SUTURE) ×1 IMPLANT

## 2023-08-28 NOTE — Anesthesia Postprocedure Evaluation (Signed)
Anesthesia Post Note  Patient: IRINA CASTELLANO  Procedure(s) Performed: Open reduction & internal fixation of right distal radius and right carpal tunnel release (Right: Wrist)  Patient location during evaluation: PACU Anesthesia Type: General Level of consciousness: awake and alert Pain management: pain level controlled Vital Signs Assessment: post-procedure vital signs reviewed and stable Respiratory status: spontaneous breathing, nonlabored ventilation, respiratory function stable and patient connected to nasal cannula oxygen Cardiovascular status: blood pressure returned to baseline and stable Postop Assessment: no apparent nausea or vomiting Anesthetic complications: no Comments: Block is working well, patient not having pain in operative arm.     No notable events documented.   Last Vitals:  Vitals:   08/28/23 1429 08/28/23 1430  BP:  121/65  Pulse:  80  Resp:  20  Temp:    SpO2: 93% 92%    Last Pain:  Vitals:   08/28/23 1430  TempSrc:   PainSc: 0-No pain                 Son Barkan C Nathaly Dawkins

## 2023-08-28 NOTE — Op Note (Signed)
08/28/2023  1:44 PM  PATIENT:  Heather Blake  62 y.o. female  PRE-OPERATIVE DIAGNOSIS:  Closed fracture of right wrist, initial encounter S62.101A Carpal tunnel syndrome of right wrist G56.01  POST-OPERATIVE DIAGNOSIS:  Closed fracture of right wrist, Carpal tunnel syndrome of right wrist  PROCEDURE:  Procedure(s): Open reduction & internal fixation of right distal radius and right carpal tunnel release (Right)  SURGEON: Leitha Schuller, MD  ASSISTANTS: None  ANESTHESIA:   general  EBL:  Total I/O In: 500 [I.V.:500] Out: -   BLOOD ADMINISTERED:none  DRAINS: none   LOCAL MEDICATIONS USED:  NONE and OTHER preop block given by anesthesia  SPECIMEN:  No Specimen  DISPOSITION OF SPECIMEN:  N/A  COUNTS:  YES  TOURNIQUET:   Total Tourniquet Time Documented: Upper Arm (Right) - 46 minutes Total: Upper Arm (Right) - 46 minutes   IMPLANTS: Hand innovations standard with short DVR plate with multiple pegs and screws  DICTATION: .Dragon Dictation patient was brought to the operating room and after adequate anesthesia was obtained the right arm was prepped and draped in the usual sterile fashion.  Tourniquet is applied the upper arm and after patient identification and timeout procedures were completed tourniquet was raised intra fingertrap traction applied at the end of the table.  Volar approach was made centered over the FCR tendon tendon sheath incised and the tendon retracted radially protect the radial artery and associated veins.  There is quite a bit of soft tissue stripping already present so the exposure of the distal shaft and distal fragments was straightforward.  With the fingertrap traction applied and some dorsal pressure on the fragments near and the length was about restored to help maintain alignment of a very comminuted fracture was about 4 distal fragments a K wire was inserted through the styloid into the shaft to hold Provisional fixation.  After this was done  clamp was placed to help maintain alignment as well of the joint line.  Of the volar plate was then applied with the distal first technique filling the distal holes with the smooth pegs getting it to the appropriate position first and getting right into the subchondral bone.  1 variable-angle screw was utilized as it was close to the joint.  After application of the distal pegs the plate was brought down and screw hole screw filled traction was removed and there to avoid over distraction and plate adjusted with the remaining cortical screws placed.  The mini C arm was used extensive during the procedure to assess reduction in position.  With a very comminuted fracture acceptable reduction was felt to have been obtained although there was still some slight step-off of the joint line.  The wound was irrigated and then the attention was turned to the carpal tunnel where approximately 2 cm incision was made in line with the ring metacarpal transcarpal ligament identified and incised and release carried out proximally and distally until vascular blush to the nerve.  The wounds were then closed the skin on the palm closed with simple erupted four 5-0 nylon tourniquet was then let down to make sure the there had been no injury to the radial artery associated veins and there was none this wound was closed with 3-0 Vicryl subcutaneously followed by 4-0 nylon for the skin Xeroform 4 x 4's Webril and a volar splint applied along with an Ace wrap  PLAN OF CARE: Discharge to home after PACU  PATIENT DISPOSITION:  PACU - hemodynamically stable.

## 2023-08-28 NOTE — Telephone Encounter (Signed)
noted 

## 2023-08-28 NOTE — Anesthesia Procedure Notes (Signed)
Anesthesia Regional Block: Supraclavicular block   Pre-Anesthetic Checklist: , timeout performed,  Correct Patient, Correct Site, Correct Laterality,  Correct Procedure, Correct Position, site marked,  Risks and benefits discussed,  Surgical consent,  Pre-op evaluation,  At surgeon's request and post-op pain management  Laterality: Right and Upper  Prep: chloraprep       Needles:  Injection technique: Single-shot  Needle Type: Echogenic Stimulator Needle      Needle Gauge: 21     Additional Needles:   Procedures:, nerve stimulator,,, ultrasound used (permanent image in chart),,     Nerve Stimulator or Paresthesia:  Response: bicep contraction, 0.45 mA  Additional Responses:   Narrative:  Start time: 08/28/2023 11:18 AM End time: 08/28/2023 11:24 AM Injection made incrementally with aspirations every 5 mL.  Performed by: Personally  Anesthesiologist: Marisue Humble, MD  Additional Notes: Functioning IV was confirmed and monitors applied.  Sterile prep and drape,hand hygiene and sterile gloves were used.Ultrasound guidance: relevant anatomy identified, needle position confirmed, local anesthetic spread visualized around nerve(s)., vascular puncture avoided.  Image printed for medical record.  Negative aspiration and negative test dose prior to incremental administration of local anesthetic. The patient tolerated the procedure well. Vitals signes recorded in RN notes. Also intercostobrachial block which is superficial and does not need ultrasound.

## 2023-08-28 NOTE — Telephone Encounter (Signed)
Patient's husband called an note was read.

## 2023-08-28 NOTE — Telephone Encounter (Signed)
Lvm for spouse Jonny Ruiz.

## 2023-08-28 NOTE — Telephone Encounter (Signed)
Please call - thank him for the update.  Please keep [place] posted and let us know how surgery goes.

## 2023-08-28 NOTE — Transfer of Care (Signed)
Immediate Anesthesia Transfer of Care Note  Patient: Heather Blake  Procedure(s) Performed: Open reduction & internal fixation of right distal radius and right carpal tunnel release (Right: Wrist)  Patient Location: PACU  Anesthesia Type: General  Level of Consciousness: awake, alert  and patient cooperative  Airway and Oxygen Therapy: Patient Spontanous Breathing and Patient connected to supplemental oxygen  Post-op Assessment: Post-op Vital signs reviewed, Patient's Cardiovascular Status Stable, Respiratory Function Stable, Patent Airway and No signs of Nausea or vomiting  Post-op Vital Signs: Reviewed and stable  Complications: No notable events documented.

## 2023-08-28 NOTE — H&P (Signed)
Chief Complaint Patient presents with Right Wrist - Pain DOI 08/25/23   History of the Present Illness: Heather Blake is a 62 y.o. right-hand dominant female here today with a very comminuted distal radius and ulnar fracture, treated in the emergency room overnight. At which time, x-rays were obtained, and the fracture was partially reduced but remains displaced. She is accompanied by her husband.  The injury occurred when she slipped and fell while preparing pizza in her kitchen. She reports numbness and tingling in her fingers, a condition that predates her fall. She has limited mobility in her fingers and is unable to perform household chores at this time. Pain medication was prescribed, but she has not yet collected it due to her inability to drive due to her injury.  She is currently not working.  She also reports having some coughing issues and chest pain. Various tests were conducted, and the results were normal. The doctors attributed these symptoms to stress.  I have reviewed past medical, surgical, social and family history, and allergies as documented in the EMR.  Past Medical History: Past Medical History: Diagnosis Date Anxiety Hyperlipidemia Hypertension  Past Surgical History: Past Surgical History: Procedure Laterality Date COLONOSCOPY 08/25/2018 Normal colon/Repeat 32yrs/TKT EGD 08/25/2018 Gastritis/No Repeat/TKT COLONOSCOPY ELBOW ARTHROSCOPY HYSTERECTOMY VAGINAL UNLISTED PROCEDURE PHARYNX/ADENOIDS/TONSILS  Past Family History: Family History Problem Relation Age of Onset Heart failure Mother Heart failure Father  Medications: Current Outpatient Medications Medication Sig Dispense Refill albuterol MDI, PROVENTIL, VENTOLIN, PROAIR, HFA 90 mcg/actuation inhaler Inhale 2 Inhalations into the lungs every 6 (six) hours as needed amLODIPine (NORVASC) 10 MG tablet Take 10 mg by mouth once daily aspirin 81 MG EC tablet Take 81 mg by mouth busPIRone (BUSPAR)  10 MG tablet Take 10 mg by mouth 2 (two) times daily cephalexin (KEFLEX) 500 MG capsule Take 500 mg by mouth 4 (four) times daily citalopram (CELEXA) 40 MG tablet Take 40 mg by mouth once daily. dextrose (GLUCOSE ORAL) Take 1 tablet by mouth continuously as needed docusate (COLACE) 100 MG capsule Take 100 mg by mouth 2 (two) times daily as needed for Constipation HYDROcodone-acetaminophen (NORCO) 5-325 mg tablet Take 2 tablets by mouth every 8 (eight) hours as needed for Pain ondansetron (ZOFRAN-ODT) 4 MG disintegrating tablet Take 1 tablet (4 mg total) by mouth every 8 (eight) hours as needed for Nausea or Vomiting 30 tablet 1 pantoprazole (PROTONIX) 40 MG DR tablet Take 40 mg by mouth once daily polyethylene glycol (MIRALAX) powder Take as directed for colonic prep. 238 g 0 rosuvastatin (CRESTOR) 40 MG tablet Take 40 mg by mouth once daily Saccharomyces boulardii (FLORASTOR) 250 mg capsule Take 250 mg by mouth 2 (two) times daily spironolactone (ALDACTONE) 25 MG tablet Take 1 tablet by mouth once daily  No current facility-administered medications for this visit.  Allergies: Allergies Allergen Reactions Aspartame Headache Triggers migraines Celecoxib Other (See Comments) Imitrex [Sumatriptan Succinate] Other (See Comments) Imitrex [Sumatriptan] Other (See Comments) Chest Pain Keflex [Cephalexin] Vomiting Nsaids (Non-Steroidal Anti-Inflammatory Drug) Other (See Comments) GI upset Paxil [Paroxetine Hcl] Other (See Comments) Causes "shakes" Percocet [Oxycodone-Acetaminophen] Hallucination Sulfa (Sulfonamide Antibiotics) Hives Vioxx [Rofecoxib] Other (See Comments) Severe stomach upset   Body mass index is 42.91 kg/m.  Review of Systems: A comprehensive 14 point ROS was performed, reviewed, and the pertinent orthopaedic findings are documented in the HPI.  Vitals: 08/26/23 1440 BP: 128/86   General Physical Examination:  HEENT examination is normal. Lungs are  clear. Heart has a regular rate and rhythm.  Radiographs:  X-rays obtained in the emergency room shows an intra-articular comminuted fracture with probably 4 distal intra-articular fragments that are completely displaced.  Assessment: ICD-10-CM 1. Closed fracture of right wrist, initial encounter S62.101A 2. 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  Plan:  Recommendation is for ORIF distal radius with volar plate. A plate example was shown to her. We will also plan on a carpal tunnel release for her acute carpal tunnel syndrome. She has recently had some anterior chest pain, but cardiology work-up has been negative for any coronary disease with good ejection fraction. Hopefully, we can get her surgery scheduled this week.  Document Attestation: Gillermina Phy, have reviewed and updated documentation for [facility], [place], utilizing Nuance DAX.  Electronically signed by Marlena Clipper, MD at 08/27/2023 2:30 PM   Reviewed  H+P. No changes noted.

## 2023-08-28 NOTE — Anesthesia Procedure Notes (Signed)
Procedure Name: Intubation Date/Time: 08/28/2023 12:30 PM  Performed by: Domenic Moras, CRNAPre-anesthesia Checklist: Patient identified, Emergency Drugs available, Suction available and Patient being monitored Patient Re-evaluated:Patient Re-evaluated prior to induction Oxygen Delivery Method: Circle system utilized Preoxygenation: Pre-oxygenation with 100% oxygen Induction Type: IV induction Ventilation: Mask ventilation without difficulty LMA: LMA inserted LMA Size: 4.0 Number of attempts: 1 Airway Equipment and Method: Oral airway Placement Confirmation: positive ETCO2 and breath sounds checked- equal and bilateral Tube secured with: Tape Dental Injury: Teeth and Oropharynx as per pre-operative assessment

## 2023-08-28 NOTE — Discharge Instructions (Addendum)
Keep arm elevated is much as possible Work your fingers all you can Pain medicine as directed Ice to the back of the wrist today and tomorrow to help with pain and swelling Call office if you are having problems [phone]

## 2023-08-29 ENCOUNTER — Other Ambulatory Visit: Payer: Self-pay | Admitting: Internal Medicine

## 2023-08-29 DIAGNOSIS — E538 Deficiency of other specified B group vitamins: Secondary | ICD-10-CM

## 2023-08-31 ENCOUNTER — Encounter: Payer: Self-pay | Admitting: Orthopedic Surgery

## 2023-09-01 ENCOUNTER — Encounter: Payer: Self-pay | Admitting: Orthopedic Surgery

## 2023-09-17 ENCOUNTER — Ambulatory Visit
Admission: RE | Admit: 2023-09-17 | Discharge: 2023-09-17 | Disposition: A | Discharge status: Home / Self Care | Payer: Managed Care, Other (non HMO) | Source: Ambulatory Visit | Attending: Internal Medicine | Admitting: Internal Medicine

## 2023-09-17 DIAGNOSIS — Z1231 Encounter for screening mammogram for malignant neoplasm of breast: Secondary | ICD-10-CM | POA: Diagnosis present

## 2023-09-22 ENCOUNTER — Encounter: Payer: Self-pay | Admitting: Occupational Therapy

## 2023-09-22 ENCOUNTER — Ambulatory Visit: Payer: Managed Care, Other (non HMO) | Attending: Orthopedic Surgery | Admitting: Occupational Therapy

## 2023-09-22 DIAGNOSIS — R600 Localized edema: Secondary | ICD-10-CM

## 2023-09-22 DIAGNOSIS — M25631 Stiffness of right wrist, not elsewhere classified: Secondary | ICD-10-CM

## 2023-09-22 DIAGNOSIS — L905 Scar conditions and fibrosis of skin: Secondary | ICD-10-CM

## 2023-09-22 DIAGNOSIS — M6281 Muscle weakness (generalized): Secondary | ICD-10-CM | POA: Diagnosis present

## 2023-09-22 DIAGNOSIS — M25641 Stiffness of right hand, not elsewhere classified: Secondary | ICD-10-CM

## 2023-09-22 NOTE — Therapy (Signed)
OUTPATIENT OCCUPATIONAL THERAPY ORTHO EVALUATION  Patient Name: Heather Blake MRN: [id number] DOB:07/08/1961, 62 y.o., female Today's Date: 09/22/2023  PCP: DR Lorin Picket REFERRING PROVIDER: Dr Rosita Kea  END OF SESSION:  OT End of Session - 09/22/23 1700     Visit Number 1    Number of Visits 14    Date for OT Re-Evaluation 11/17/23    OT Start Time 1446    OT Stop Time 1536    OT Time Calculation (min) 50 min    Activity Tolerance Patient tolerated treatment well    Behavior During Therapy WFL for tasks assessed/performed             Past Medical History:  Diagnosis Date   Allergy    Anemia    Cancer (HCC)    Depression    Diverticulosis    FHx: migraine headaches    GERD (gastroesophageal reflux disease)    History of chickenpox    Hypercholesterolemia    Hyperlipidemia    Hypertension    Morbid obesity with BMI of 40.0-44.9, adult (HCC)    Motion sickness    ocean boats   OSA on CPAP    Osteoarthritis    Panic attacks    Sleep apnea    CPAP   Vertigo    none for over 10 yrs   Past Surgical History:  Procedure Laterality Date   ABDOMINAL HYSTERECTOMY     CATARACT EXTRACTION W/PHACO Left 08/11/2017   Procedure: CATARACT EXTRACTION PHACO AND INTRAOCULAR LENS PLACEMENT (IOC);  Surgeon: Galen Manila, MD;  Location: [facility];  Service: Ophthalmology;  Laterality: Left;  [place] 00:31 AP% 19.2 CDE 6.13 Fluid Pack lot # [id number]   CATARACT EXTRACTION W/PHACO Right 09/01/2017   Procedure: CATARACT EXTRACTION PHACO AND INTRAOCULAR LENS PLACEMENT (IOC);  Surgeon: Galen Manila, MD;  Location: [facility];  Service: Ophthalmology;  Laterality: Right;  [place] 00:30 AP% 11.7 CDE3.52 fluid pack lot #[id number]   Child Birth  1983 and 1979   COLONOSCOPY     COLONOSCOPY WITH PROPOFOL N/A 08/25/2018   Procedure: COLONOSCOPY WITH PROPOFOL;  Surgeon: Toledo, Boykin Nearing, MD;  Location: [facility];  Service: Gastroenterology;  Laterality: N/A;   ESOPHAGOGASTRODUODENOSCOPY      ESOPHAGOGASTRODUODENOSCOPY (EGD) WITH PROPOFOL N/A 08/25/2018   Procedure: ESOPHAGOGASTRODUODENOSCOPY (EGD) WITH PROPOFOL;  Surgeon: Toledo, Boykin Nearing, MD;  Location: [facility];  Service: Gastroenterology;  Laterality: N/A;   FRACTURE SURGERY     LAPAROSCOPIC SUPRACERVICAL HYSTERECTOMY  2006   ovaries left in place   left elbow     left knee     Miscarriage  1987   OPEN REDUCTION INTERNAL FIXATION (ORIF) DISTAL RADIAL FRACTURE Right 08/28/2023   Procedure: Open reduction & internal fixation of right distal radius and right carpal tunnel release;  Surgeon: Kennedy Bucker, MD;  Location: [facility];  Service: Orthopedics;  Laterality: Right;   TONSILLECTOMY  1963   TUBAL LIGATION  1993   Patient Active Problem List   Diagnosis Date Noted   Near syncope 03/21/2023   Increased heart rate 02/01/2023   Bronchitis 12/31/2022   Sinus congestion 11/09/2022   Fall 09/14/2022   Cough 09/08/2022   Anemia 05/18/2022   Pre-op evaluation 04/17/2021   Ankle pain, right 12/12/2020   Hyperglycemia 10/09/2018   Sleep apnea 07/01/2018   Healthcare maintenance 09/19/2017   Chest pain 07/25/2017   Localized swelling, mass or lump of neck 06/21/2015   Right hip pain 01/09/2014   GERD (gastroesophageal reflux disease) 12/25/2013  Essential hypertension, benign 02/10/2013   Hypercholesterolemia 02/10/2013   Diverticulosis 02/10/2013   Anxiety 02/10/2013    ONSET DATE: 09/07/23  REFERRING DIAG: R wrist fx with ORIF and CTR  THERAPY DIAG:  Stiffness of right hand, not elsewhere classified  Stiffness of right wrist, not elsewhere classified  Muscle weakness (generalized)  Scar condition and fibrosis of skin  Localized edema  Rationale for Evaluation and Treatment: Rehabilitation  SUBJECTIVE:   SUBJECTIVE STATEMENT: My fingers is stiff.  And my wrist and hand are swollen.  I wear it and this brace as well as sling.  And then my wrist I am not moving yet-I did take a pain  pill before I came Pt accompanied by: significant other  PERTINENT HISTORY: 09/14/23 Ortho note : The patient is a 62 year old female who is 2 weeks status post ORIF of the right wrist and carpal tunnel release. Date of surgery was 08/27/2022. Refer to OT for sup /pron and fingers ROM   PRECAUTIONS: Wrist ORIF 08/28/23    WEIGHT BEARING RESTRICTIONS: No  PAIN:  Are you having pain?  3-11/10 in wrist and hand   FALLS: Has patient fallen in last 6 months? Yes. Number of falls 1  LIVING ENVIRONMENT: Lives with: lives with their spouse  PLOF: Husband has terminal cancer.  Patient used to drive and do grocery shopping.  Patient does cooking and laundry and cleaning of the house  PATIENT GOALS: I want my pain and swelling better and get my motion and strength back so that I can drive and go to the grocery store to help my husband as needed  NEXT MD VISIT: 10/05/23  OBJECTIVE:  Note: Objective measures were completed at Evaluation unless otherwise noted.  HAND DOMINANCE: Right   UPPER EXTREMITY ROM:     Active ROM Right eval Left eval  Shoulder flexion    Shoulder abduction    Shoulder adduction    Shoulder extension    Shoulder internal rotation    Shoulder external rotation    Elbow flexion    Elbow extension    Wrist flexion 50   Wrist extension 15   Wrist ulnar deviation 18   Wrist radial deviation 10   Wrist pronation    Wrist supination 65 after contrast 75   (Blank rows = not tested)  Active ROM Right eval Left eval  Thumb MCP (0-60)    Thumb IP (0-80)    Thumb Radial abd/add (0-55) 48    Thumb Palmar abd/add (0-45) 60    Thumb Opposition to Small Finger Opposition to side of 4th     Index MCP (0-90) 75    Index PIP (0-100) 90    Index DIP (0-70)      Long MCP (0-90)  70    Long PIP (0-100)  80    Long DIP (0-70)      Ring MCP (0-90)  60    Ring PIP (0-100)  95 decrease ext     Ring DIP (0-70)      Little MCP (0-90)  80    Little PIP (0-100)  90  decrease ext     Little DIP (0-70)      (Blank rows = not tested)   HAND FUNCTION: NT 3 1/2 wks s/p at eval  COORDINATION: Decrease - in brace - edema and 3 1/2 wks s/p  SENSATION: Numbness reported in the fifth digit as well as decreased sensation in thumb through fourth.  Per patient numbness coming and  going and pins-and-needles coming and going.  EDEMA: Right wrist circumference 17.5 cm and the left 20 cm  COGNITION: Overall cognitive status: Within functional limits for tasks assessed   OBSERVATIONS: Patient arrive with right arm in sling.   TODAY'S TREATMENT:                                                                                                                              DATE: 09/22/23  Done contrast with patient and educated to do at home 2-3 times a day prior to home exercises to decrease edema and pain. Scar massage patient educated on after Steri-Strips removed at volar wrist.  As well as Cica -Care scar pad at nighttime. Steri-Strips in place over carpal tunnel scar. Patient fitted with a Tubigrip D to use under brace for edema. Home exercise: After contrast to be done thumb palmar radial abduction as well as opposition with focusing on making oval 12 reps each. Patient to light rolling over foam roller for digit extension as well as soft tissue massage to volar hand prior to tendon glides. Blocked tendon glides active range of motion 12 reps. And then active range of motion for supination and pronation Patient to have slight pull but pain less than a 3/10.  PATIENT EDUCATION: Education details: findings of eval and HEP  Person educated: Patient Education method: Explanation, Demonstration, Tactile cues, Verbal cues, and Handouts Education comprehension: verbalized understanding, returned demonstration, verbal cues required, and needs further education   GOALS: Goals reviewed with patient? Yes     LONG TERM GOALS: Target date:  8 wks     Patient be independent in home program to decrease edema and pain less than the 3 to increase motion making composite fist. Baseline: Pain 3-11/10 wrist and hand.  No knowledge of home program.  Edema at the wrist increased by 2.5 cm in circumference.  MC flexion 60 to 80 degrees and PIP 80 to 95 degrees. Goal status: INITIAL  2.  Right wrist active range of motion increased in all planes to within functional limits for patient to use right dominant hand and more than 75% of bathing and dressing. Baseline: Patient 7-1/2 weeks postop.  Wrist brace most of the time.  Wrist extension 15 and flexion 50.  With ulnar deviation 18 and radial deviation 10 Goal status: INITIAL  3.  Right supination improved to 85 or more for patient to turn doorknob without increase symptoms. Baseline: Supination 65 degrees with pain on ulnar wrist not able to use hand functionally. Goal status: INITIAL  4.  Right wrist strength in all planes in range improved for patient to push and pull heavy door as well as turn doorknob return to driving and do laundry without increase symptoms Baseline: Patient 3-1/2 weeks postop and a wrist brace still.  No strength Goal status: INITIAL  5.  Right grip and prehension strength in right hand improved to more than 60% compared to the left  for patient to carry more than 5 pounds without increase symptoms as well as pull up pants and socks, turn keep Baseline: Patient 3-1/2 weeks postop and wrist brace Goal status: INITIAL  ASSESSMENT:  CLINICAL IMPRESSION: Patient seen today for occupational therapy evaluation for right dominant hand distal radius fracture with ORIF and a carpal tunnel release.  Done on 08/28/2023.  Patient arrived with hand in a prefab wrist splint as well as a sling.  Reviewed with patient to not use a sling at home because of risk for increased shoulder and elbow issues.  Patient with increased edema in right wrist and hand , with increased pain 3-11/10 per  patient.  Patient with decreased range of motion in all digits as well as wrist in all planes.  As well as decreased strength.  Steri-Strips was removed on volar wrist incision.  But carpal tunnel incision to Steri-Strips left until next visit.  Patient was educated on scar management as well as use of Cica -Care scar pad at nighttime.  Patient can benefit from skilled OT services to decrease edema and scar tissue as well as pain and increase motion and strength to return to prior level of function using right dominant hand.  PERFORMANCE DEFICITS: in functional skills including ADLs, IADLs, ROM, strength, pain, flexibility, decreased knowledge of use of DME, and UE functional use,   and psychosocial skills including environmental adaptation and routines and behaviors.   IMPAIRMENTS: are limiting patient from ADLs, IADLs, rest and sleep, play, leisure, and social participation.   COMORBIDITIES: has no other co-morbidities that affects occupational performance. Patient will benefit from skilled OT to address above impairments and improve overall function.  MODIFICATION OR ASSISTANCE TO COMPLETE EVALUATION: No modification of tasks or assist necessary to complete an evaluation.  OT OCCUPATIONAL PROFILE AND HISTORY: Problem focused assessment: Including review of records relating to presenting problem.  CLINICAL DECISION MAKING: LOW - limited treatment options, no task modification necessary  REHAB POTENTIAL: Good for goals  EVALUATION COMPLEXITY: Low   PLAN:  OT FREQUENCY: 1-2x/week  OT DURATION: 8 weeks  PLANNED INTERVENTIONS: 97535 self care/ADL training, 91478 therapeutic exercise, 97140 manual therapy, 97018 paraffin, 29562 fluidotherapy, 97034 contrast bath, 97760 Orthotics management and training, scar mobilization, passive range of motion, patient/family education, and DME and/or AE instructions    CONSULTED AND AGREED WITH PLAN OF CARE: Patient     Oletta Cohn,  OTR/L,CLT 09/22/2023, 5:02 PM

## 2023-09-25 ENCOUNTER — Ambulatory Visit: Payer: Managed Care, Other (non HMO) | Admitting: Occupational Therapy

## 2023-09-25 DIAGNOSIS — M25641 Stiffness of right hand, not elsewhere classified: Secondary | ICD-10-CM

## 2023-09-25 DIAGNOSIS — M6281 Muscle weakness (generalized): Secondary | ICD-10-CM

## 2023-09-25 DIAGNOSIS — M25631 Stiffness of right wrist, not elsewhere classified: Secondary | ICD-10-CM

## 2023-09-25 DIAGNOSIS — R600 Localized edema: Secondary | ICD-10-CM

## 2023-09-25 DIAGNOSIS — L905 Scar conditions and fibrosis of skin: Secondary | ICD-10-CM

## 2023-09-26 ENCOUNTER — Encounter: Payer: Self-pay | Admitting: Occupational Therapy

## 2023-09-26 NOTE — Therapy (Signed)
OUTPATIENT OCCUPATIONAL THERAPY ORTHO TREATMENT NOTE  Patient Name: Heather Blake MRN: [id number] DOB:December 28, 1960, 62 y.o., female   PCP: DR Dara Lords PROVIDER: Dr Rosita Kea  END OF SESSION:  OT End of Session - 09/26/23 1820     Visit Number 2    Number of Visits 14    Date for OT Re-Evaluation 11/17/23    OT Start Time 0901    OT Stop Time 0948    OT Time Calculation (min) 47 min    Activity Tolerance Patient tolerated treatment well    Behavior During Therapy WFL for tasks assessed/performed             Past Medical History:  Diagnosis Date   Allergy    Anemia    Cancer (HCC)    Depression    Diverticulosis    FHx: migraine headaches    GERD (gastroesophageal reflux disease)    History of chickenpox    Hypercholesterolemia    Hyperlipidemia    Hypertension    Morbid obesity with BMI of 40.0-44.9, adult (HCC)    Motion sickness    ocean boats   OSA on CPAP    Osteoarthritis    Panic attacks    Sleep apnea    CPAP   Vertigo    none for over 10 yrs   Past Surgical History:  Procedure Laterality Date   ABDOMINAL HYSTERECTOMY     CATARACT EXTRACTION W/PHACO Left 08/11/2017   Procedure: CATARACT EXTRACTION PHACO AND INTRAOCULAR LENS PLACEMENT (IOC);  Surgeon: Galen Manila, MD;  Location: [facility];  Service: Ophthalmology;  Laterality: Left;  [place] 00:31 AP% 19.2 CDE 6.13 Fluid Pack lot # [id number]   CATARACT EXTRACTION W/PHACO Right 09/01/2017   Procedure: CATARACT EXTRACTION PHACO AND INTRAOCULAR LENS PLACEMENT (IOC);  Surgeon: Galen Manila, MD;  Location: [facility];  Service: Ophthalmology;  Laterality: Right;  [place] 00:30 AP% 11.7 CDE3.52 fluid pack lot #[id number]   Child Birth  1981 and 1979   COLONOSCOPY     COLONOSCOPY WITH PROPOFOL N/A 08/25/2018   Procedure: COLONOSCOPY WITH PROPOFOL;  Surgeon: Toledo, Boykin Nearing, MD;  Location: [facility];  Service: Gastroenterology;  Laterality: N/A;   ESOPHAGOGASTRODUODENOSCOPY      ESOPHAGOGASTRODUODENOSCOPY (EGD) WITH PROPOFOL N/A 08/25/2018   Procedure: ESOPHAGOGASTRODUODENOSCOPY (EGD) WITH PROPOFOL;  Surgeon: Toledo, Boykin Nearing, MD;  Location: [facility];  Service: Gastroenterology;  Laterality: N/A;   FRACTURE SURGERY     LAPAROSCOPIC SUPRACERVICAL HYSTERECTOMY  2006   ovaries left in place   left elbow     left knee     Miscarriage  1987   OPEN REDUCTION INTERNAL FIXATION (ORIF) DISTAL RADIAL FRACTURE Right 08/28/2023   Procedure: Open reduction & internal fixation of right distal radius and right carpal tunnel release;  Surgeon: Kennedy Bucker, MD;  Location: [facility];  Service: Orthopedics;  Laterality: Right;   TONSILLECTOMY  1963   TUBAL LIGATION  1993   Patient Active Problem List   Diagnosis Date Noted   Near syncope 03/21/2023   Increased heart rate 02/01/2023   Bronchitis 12/31/2022   Sinus congestion 11/09/2022   Fall 09/14/2022   Cough 09/08/2022   Anemia 05/18/2022   Pre-op evaluation 04/17/2021   Ankle pain, right 12/12/2020   Hyperglycemia 10/09/2018   Sleep apnea 07/01/2018   Healthcare maintenance 09/19/2017   Chest pain 07/25/2017   Localized swelling, mass or lump of neck 06/21/2015   Right hip pain 01/09/2014   GERD (gastroesophageal reflux disease) 12/25/2013   Essential  hypertension, benign 02/10/2013   Hypercholesterolemia 02/10/2013   Diverticulosis 02/10/2013   Anxiety 02/10/2013    ONSET DATE: 09/07/23  REFERRING DIAG: R wrist fx with ORIF and CTR  THERAPY DIAG:  Stiffness of right hand, not elsewhere classified  Stiffness of right wrist, not elsewhere classified  Muscle weakness (generalized)  Scar condition and fibrosis of skin  Localized edema  Rationale for Evaluation and Treatment: Rehabilitation  SUBJECTIVE:   SUBJECTIVE STATEMENT: Pt reports she has stopped wearing the sling as recommended and only wears for short time in public for protection but removes when back home.  Husband present  during session. Reports she forgot her red foam last time so couldn't use it for exercise.  "Can we go over these exercises again?"  Pt accompanied by: significant other   PERTINENT HISTORY: 09/14/23 Ortho note : The patient is a 62 year old female who is 2 weeks status post ORIF of the right wrist and carpal tunnel release. Date of surgery was 08/27/2022. Refer to OT for sup /pron and fingers ROM   PRECAUTIONS: Wrist ORIF 08/28/23  WEIGHT BEARING RESTRICTIONS: No  PAIN:  Are you having pain?  3-11/10 in wrist and hand   FALLS: Has patient fallen in last 6 months? Yes. Number of falls 1  LIVING ENVIRONMENT: Lives with: lives with their spouse  PLOF: Husband has terminal cancer.  Patient used to drive and do grocery shopping.  Patient does cooking and laundry and cleaning of the house  PATIENT GOALS: I want my pain and swelling better and get my motion and strength back so that I can drive and go to the grocery store to help my husband as needed  NEXT MD VISIT: 10/05/23  OBJECTIVE:  Note: Objective measures were completed at Evaluation unless otherwise noted.  HAND DOMINANCE: Right   UPPER EXTREMITY ROM:     Active ROM Right eval Left eval  Shoulder flexion    Shoulder abduction    Shoulder adduction    Shoulder extension    Shoulder internal rotation    Shoulder external rotation    Elbow flexion    Elbow extension    Wrist flexion 50   Wrist extension 15   Wrist ulnar deviation 18   Wrist radial deviation 10   Wrist pronation    Wrist supination 65 after contrast 75   (Blank rows = not tested)  Active ROM Right eval Left eval  Thumb MCP (0-60)    Thumb IP (0-80)    Thumb Radial abd/add (0-55) 48    Thumb Palmar abd/add (0-45) 60    Thumb Opposition to Small Finger Opposition to side of 4th     Index MCP (0-90) 75    Index PIP (0-100) 90    Index DIP (0-70)      Long MCP (0-90)  70    Long PIP (0-100)  80    Long DIP (0-70)      Ring MCP (0-90)  60     Ring PIP (0-100)  95 decrease ext     Ring DIP (0-70)      Little MCP (0-90)  80    Little PIP (0-100)  90 decrease ext     Little DIP (0-70)      (Blank rows = not tested)   HAND FUNCTION: NT 3 1/2 wks s/p at eval  COORDINATION: Decrease - in brace - edema and 3 1/2 wks s/p  SENSATION: Numbness reported in the fifth digit as well as decreased sensation  in thumb through fourth.  Per patient numbness coming and going and pins-and-needles coming and going.  EDEMA: Right wrist circumference 17.5 cm and the left 20 cm  COGNITION: Overall cognitive status: Within functional limits for tasks assessed   OBSERVATIONS: Patient arrive with right arm in sling.   TODAY'S TREATMENT:                                                                                                                              DATE: 09/25/23   Contrast:  Pt seen for use of contrast therapy to decrease pain, increase tissue mobility and range of motion.  9 mins total  Manual Therapy:   Following contrast, Pt seen for focus on scar massage to volar forearm to decrease adhesions, improve ROM and decrease pain.   Steri-Strips remain in place over carpal tunnel scar.  Patient fitted with a Tubigrip D to use under brace for edema, requires additional tubigrip to wear this date.    Therapeutic Exercises:   Thumb palmar radial abduction,  opposition with focusing on making oval 12 reps each. Patient performing light rolling with foam roller for digit extension as well as soft tissue massage to volar hand prior to tendon glides. Blocked tendon glides performed with therapist demonstration and cues followed by active range of motion 12 reps. active range of motion for supination/pronation Added shoulder exercises for horizontal ABD and shoulder flexion to decrease tightness and improve motion, pt with decreased use of right UE for daily tasks due to pain and stiffness from sling use.  Increased tightness in  pectoralis region.  Issued written/pictorial exercise program via [employer].   Re educated on exercises for home program since patient was confused about positioning of hand for thumb exercises.   Continue with home exercises and program 2-3 times a day to decrease edema and pain.  PATIENT EDUCATION: Education details: findings of eval and HEP  Person educated: Patient Education method: Explanation, Demonstration, Tactile cues, Verbal cues, and Handouts Education comprehension: verbalized understanding, returned demonstration, verbal cues required, and needs further education   GOALS: Goals reviewed with patient? Yes     LONG TERM GOALS: Target date:  8 wks    Patient be independent in home program to decrease edema and pain less than the 3 to increase motion making composite fist. Baseline: Pain 3-11/10 wrist and hand.  No knowledge of home program.  Edema at the wrist increased by 2.5 cm in circumference.  MC flexion 60 to 80 degrees and PIP 80 to 95 degrees. Goal status: INITIAL  2.  Right wrist active range of motion increased in all planes to within functional limits for patient to use right dominant hand and more than 75% of bathing and dressing. Baseline: Patient 7-1/2 weeks postop.  Wrist brace most of the time.  Wrist extension 15 and flexion 50.  With ulnar deviation 18 and radial deviation 10 Goal status: INITIAL  3.  Right supination improved  to 85 or more for patient to turn doorknob without increase symptoms. Baseline: Supination 65 degrees with pain on ulnar wrist not able to use hand functionally. Goal status: INITIAL  4.  Right wrist strength in all planes in range improved for patient to push and pull heavy door as well as turn doorknob return to driving and do laundry without increase symptoms Baseline: Patient 3-1/2 weeks postop and a wrist brace still.  No strength Goal status: INITIAL  5.  Right grip and prehension strength in right hand improved to more than  60% compared to the left for patient to carry more than 5 pounds without increase symptoms as well as pull up pants and socks, turn keep Baseline: Patient 3-1/2 weeks postop and wrist brace Goal status: INITIAL  ASSESSMENT:  CLINICAL IMPRESSION: Patient seen for occupational therapy evaluation for right dominant hand distal radius fracture with ORIF and a carpal tunnel release. Surgery on 08/28/2023.  Patient arrived with hand in a prefab wrist splint as well as a sling.  Reviewed with patient to not use a sling at home because of risk for increased shoulder and elbow issues.  Patient with increased edema in right wrist and hand , with increased pain 3-6/10 per patient.  Patient with decreased strength and range of motion in all digits as well as wrist in all planes. Steri-Strips remain on carpal tunnel incision with continued healing.  Patient was re-educated on scar management as well as use of [employer] scar pad at nighttime.  Pt required reinstruction this date on home program and able to complete with use of written materials, cues and demonstration.  Added ROM exercises for shoulder to decrease pain and tightness/stiffness and promote greater ROM since pt was wearing sling prior to therapy intervention.  Patient continues to benefit from skilled OT services to decrease edema and scar tissue as well as pain and increase motion and strength to return to prior level of function using right dominant hand.  PERFORMANCE DEFICITS: in functional skills including ADLs, IADLs, ROM, strength, pain, flexibility, decreased knowledge of use of DME, and UE functional use,   and psychosocial skills including environmental adaptation and routines and behaviors.   IMPAIRMENTS: are limiting patient from ADLs, IADLs, rest and sleep, play, leisure, and social participation.   COMORBIDITIES: has no other co-morbidities that affects occupational performance. Patient will benefit from skilled OT to address above  impairments and improve overall function.  MODIFICATION OR ASSISTANCE TO COMPLETE EVALUATION: No modification of tasks or assist necessary to complete an evaluation.  OT OCCUPATIONAL PROFILE AND HISTORY: Problem focused assessment: Including review of records relating to presenting problem.  CLINICAL DECISION MAKING: LOW - limited treatment options, no task modification necessary  REHAB POTENTIAL: Good for goals  EVALUATION COMPLEXITY: Low   PLAN:  OT FREQUENCY: 1-2x/week  OT DURATION: 8 weeks  PLANNED INTERVENTIONS: 97535 self care/ADL training, 42595 therapeutic exercise, 97140 manual therapy, 97018 paraffin, 63875 fluidotherapy, 97034 contrast bath, 97760 Orthotics management and training, scar mobilization, passive range of motion, patient/family education, and DME and/or AE instructions   CONSULTED AND AGREED WITH PLAN OF CARE: Patient   Derrek Gu, OTR/L,CLT 09/26/2023, 6:21 PM

## 2023-09-29 ENCOUNTER — Ambulatory Visit: Payer: Managed Care, Other (non HMO) | Admitting: Occupational Therapy

## 2023-09-29 DIAGNOSIS — R600 Localized edema: Secondary | ICD-10-CM

## 2023-09-29 DIAGNOSIS — M25641 Stiffness of right hand, not elsewhere classified: Secondary | ICD-10-CM | POA: Diagnosis not present

## 2023-09-29 DIAGNOSIS — M6281 Muscle weakness (generalized): Secondary | ICD-10-CM

## 2023-09-29 DIAGNOSIS — L905 Scar conditions and fibrosis of skin: Secondary | ICD-10-CM

## 2023-09-29 DIAGNOSIS — M25631 Stiffness of right wrist, not elsewhere classified: Secondary | ICD-10-CM

## 2023-10-02 ENCOUNTER — Ambulatory Visit: Payer: Managed Care, Other (non HMO) | Admitting: Occupational Therapy

## 2023-10-02 DIAGNOSIS — M6281 Muscle weakness (generalized): Secondary | ICD-10-CM

## 2023-10-02 DIAGNOSIS — L905 Scar conditions and fibrosis of skin: Secondary | ICD-10-CM

## 2023-10-02 DIAGNOSIS — M25641 Stiffness of right hand, not elsewhere classified: Secondary | ICD-10-CM

## 2023-10-02 DIAGNOSIS — R600 Localized edema: Secondary | ICD-10-CM

## 2023-10-02 DIAGNOSIS — M25631 Stiffness of right wrist, not elsewhere classified: Secondary | ICD-10-CM

## 2023-10-02 NOTE — Therapy (Unsigned)
OUTPATIENT OCCUPATIONAL THERAPY ORTHO TREATMENT NOTE  Patient Name: Heather Blake MRN: [id number] DOB:1960-12-25, 62 y.o., female   PCP: DR Dara Lords PROVIDER: Dr Rosita Kea  END OF SESSION:  OT End of Session - 10/04/23 0913     Visit Number 3    Number of Visits 14    Date for OT Re-Evaluation 11/17/23    OT Start Time 1452    OT Stop Time 1537    OT Time Calculation (min) 45 min    Activity Tolerance Patient tolerated treatment well    Behavior During Therapy WFL for tasks assessed/performed             Past Medical History:  Diagnosis Date   Allergy    Anemia    Cancer (HCC)    Depression    Diverticulosis    FHx: migraine headaches    GERD (gastroesophageal reflux disease)    History of chickenpox    Hypercholesterolemia    Hyperlipidemia    Hypertension    Morbid obesity with BMI of 40.0-44.9, adult (HCC)    Motion sickness    ocean boats   OSA on CPAP    Osteoarthritis    Panic attacks    Sleep apnea    CPAP   Vertigo    none for over 10 yrs   Past Surgical History:  Procedure Laterality Date   ABDOMINAL HYSTERECTOMY     CATARACT EXTRACTION W/PHACO Left 08/11/2017   Procedure: CATARACT EXTRACTION PHACO AND INTRAOCULAR LENS PLACEMENT (IOC);  Surgeon: Galen Manila, MD;  Location: [facility];  Service: Ophthalmology;  Laterality: Left;  [place] 00:31 AP% 19.2 CDE 6.13 Fluid Pack lot # [id number]   CATARACT EXTRACTION W/PHACO Right 09/01/2017   Procedure: CATARACT EXTRACTION PHACO AND INTRAOCULAR LENS PLACEMENT (IOC);  Surgeon: Galen Manila, MD;  Location: [facility];  Service: Ophthalmology;  Laterality: Right;  [place] 00:30 AP% 11.7 CDE3.52 fluid pack lot #[id number]   Child Birth  1981 and 1979   COLONOSCOPY     COLONOSCOPY WITH PROPOFOL N/A 08/25/2018   Procedure: COLONOSCOPY WITH PROPOFOL;  Surgeon: Toledo, Boykin Nearing, MD;  Location: [facility];  Service: Gastroenterology;  Laterality: N/A;   ESOPHAGOGASTRODUODENOSCOPY      ESOPHAGOGASTRODUODENOSCOPY (EGD) WITH PROPOFOL N/A 08/25/2018   Procedure: ESOPHAGOGASTRODUODENOSCOPY (EGD) WITH PROPOFOL;  Surgeon: Toledo, Boykin Nearing, MD;  Location: [facility];  Service: Gastroenterology;  Laterality: N/A;   FRACTURE SURGERY     LAPAROSCOPIC SUPRACERVICAL HYSTERECTOMY  2006   ovaries left in place   left elbow     left knee     Miscarriage  1987   OPEN REDUCTION INTERNAL FIXATION (ORIF) DISTAL RADIAL FRACTURE Right 08/28/2023   Procedure: Open reduction & internal fixation of right distal radius and right carpal tunnel release;  Surgeon: Kennedy Bucker, MD;  Location: [facility];  Service: Orthopedics;  Laterality: Right;   TONSILLECTOMY  1963   TUBAL LIGATION  1993   Patient Active Problem List   Diagnosis Date Noted   Near syncope 03/21/2023   Increased heart rate 02/01/2023   Bronchitis 12/31/2022   Sinus congestion 11/09/2022   Fall 09/14/2022   Cough 09/08/2022   Anemia 05/18/2022   Pre-op evaluation 04/17/2021   Ankle pain, right 12/12/2020   Hyperglycemia 10/09/2018   Sleep apnea 07/01/2018   Healthcare maintenance 09/19/2017   Chest pain 07/25/2017   Localized swelling, mass or lump of neck 06/21/2015   Right hip pain 01/09/2014   GERD (gastroesophageal reflux disease) 12/25/2013   Essential  hypertension, benign 02/10/2013   Hypercholesterolemia 02/10/2013   Diverticulosis 02/10/2013   Anxiety 02/10/2013    ONSET DATE: 09/07/23  REFERRING DIAG: R wrist fx with ORIF and CTR  THERAPY DIAG:  Stiffness of right hand, not elsewhere classified  Muscle weakness (generalized)  Localized edema  Stiffness of right wrist, not elsewhere classified  Scar condition and fibrosis of skin  Rationale for Evaluation and Treatment: Rehabilitation  SUBJECTIVE:   SUBJECTIVE STATEMENT: Pain in right wrist, 4/10.  Increased pain yesterday, thinks she might have over done the exercises.  Was thinking the exercise for radial ABD.  Pt reports some  tingling in all fingers, noticed it more yesterday.   Pt reports she goes back to the doctor on the 23rd for check up with Rosita Kea, wants to ask then if she can start driving again.  She is impressed with her other scar and use of scar pad.   Pt accompanied by: significant other  PERTINENT HISTORY: 09/14/23 Ortho note : The patient is a 62 year old female who is 2 weeks status post ORIF of the right wrist and carpal tunnel release. Date of surgery was 08/27/2022. Refer to OT for sup /pron and fingers ROM   PRECAUTIONS: Wrist ORIF 08/28/23  WEIGHT BEARING RESTRICTIONS: No  PAIN:  Are you having pain?  3-11/10 in wrist and hand   FALLS: Has patient fallen in last 6 months? Yes. Number of falls 1  LIVING ENVIRONMENT: Lives with: lives with their spouse  PLOF: Husband has terminal cancer.  Patient used to drive and do grocery shopping.  Patient does cooking and laundry and cleaning of the house  PATIENT GOALS: I want my pain and swelling better and get my motion and strength back so that I can drive and go to the grocery store to help my husband as needed  NEXT MD VISIT: 10/05/23  OBJECTIVE:  Note: Objective measures were completed at Evaluation unless otherwise noted.  HAND DOMINANCE: Right   UPPER EXTREMITY ROM:     Active ROM Right eval Left eval  Shoulder flexion    Shoulder abduction    Shoulder adduction    Shoulder extension    Shoulder internal rotation    Shoulder external rotation    Elbow flexion    Elbow extension    Wrist flexion 50   Wrist extension 15   Wrist ulnar deviation 18   Wrist radial deviation 10   Wrist pronation    Wrist supination 65 after contrast 75   (Blank rows = not tested)  Active ROM Right eval Left eval  Thumb MCP (0-60)    Thumb IP (0-80)    Thumb Radial abd/add (0-55) 48    Thumb Palmar abd/add (0-45) 60    Thumb Opposition to Small Finger Opposition to side of 4th     Index MCP (0-90) 75    Index PIP (0-100) 90    Index DIP  (0-70)      Long MCP (0-90)  70    Long PIP (0-100)  80    Long DIP (0-70)      Ring MCP (0-90)  60    Ring PIP (0-100)  95 decrease ext     Ring DIP (0-70)      Little MCP (0-90)  80    Little PIP (0-100)  90 decrease ext     Little DIP (0-70)      (Blank rows = not tested)  HAND FUNCTION: NT 3 1/2 wks s/p at eval  COORDINATION: Decrease - in brace - edema and 3 1/2 wks s/p  SENSATION: Numbness reported in the fifth digit as well as decreased sensation in thumb through fourth.  Per patient numbness coming and going and pins-and-needles coming and going.  EDEMA: Right wrist circumference 17.5 cm and the left 20 cm  COGNITION: Overall cognitive status: Within functional limits for tasks assessed   OBSERVATIONS: Patient arrive with right arm in sling.   TODAY'S TREATMENT:                                                                                                                              DATE: 09/29/23   Contrast:  Pt seen for use of contrast therapy to decrease pain, increase tissue mobility and range of motion.  9 mins total  Manual Therapy:   Following contrast, Pt seen for focus on scar massage to volar forearm to decrease adhesions, improve ROM and decrease pain.   Steri-Strips coming off carpal tunnel scar, removed remaining strips, some small scabbing over the area remain.   Pt seen for scar massage techniques over scar on forearm, instructed pt on performing scar massage at home in all directions on a daily basis, multiple times a day.  Use of scar pad over carpal tunnel incision to pad over area and initiated light scar massage to this area.    Therapeutic Exercises:   Thumb palmar radial abduction,  opposition with focusing on making oval 12 reps each. Patient performing light rolling with foam roller for digit extension as well as soft tissue massage to volar hand prior to tendon glides and to massage over scar on forearm. Blocked tendon glides performed  with therapist demonstration and cues followed by active range of motion 12 reps. Active range of motion for supination/pronation Shoulder exercises for horizontal ABD and shoulder flexion to decrease tightness and improve motion, pt with decreased use of right UE for daily tasks due to pain and stiffness from sling use previously.  Continued increased tightness in pectoralis region.  Pt following written/pictorial exercise program via [employer] at home and requires minimal cues for proper form and technique.       Continue with home exercises and program 2-3 times a day to decrease edema and pain.  PATIENT EDUCATION: Education details: findings of eval and HEP  Person educated: Patient Education method: Explanation, Demonstration, Tactile cues, Verbal cues, and Handouts Education comprehension: verbalized understanding, returned demonstration, verbal cues required, and needs further education   GOALS: Goals reviewed with patient? Yes  LONG TERM GOALS: Target date:  8 wks   Patient be independent in home program to decrease edema and pain less than the 3 to increase motion making composite fist. Baseline: Pain 3-11/10 wrist and hand.  No knowledge of home program.  Edema at the wrist increased by 2.5 cm in circumference.  MC flexion 60 to 80 degrees and PIP 80 to 95 degrees. Goal status: INITIAL  2.  Right  wrist active range of motion increased in all planes to within functional limits for patient to use right dominant hand and more than 75% of bathing and dressing. Baseline: Patient 7-1/2 weeks postop.  Wrist brace most of the time.  Wrist extension 15 and flexion 50.  With ulnar deviation 18 and radial deviation 10 Goal status: INITIAL  3.  Right supination improved to 85 or more for patient to turn doorknob without increase symptoms. Baseline: Supination 65 degrees with pain on ulnar wrist not able to use hand functionally. Goal status: INITIAL  4.  Right wrist strength in all planes  in range improved for patient to push and pull heavy door as well as turn doorknob return to driving and do laundry without increase symptoms Baseline: Patient 3-1/2 weeks postop and a wrist brace still.  No strength Goal status: INITIAL  5.  Right grip and prehension strength in right hand improved to more than 60% compared to the left for patient to carry more than 5 pounds without increase symptoms as well as pull up pants and socks, turn keep Baseline: Patient 3-1/2 weeks postop and wrist brace Goal status: INITIAL  ASSESSMENT:  CLINICAL IMPRESSION: Patient seen for occupational therapy evaluation for right dominant hand distal radius fracture with ORIF and a carpal tunnel release. Surgery on 08/28/2023.  Patient arrived with hand in a prefab wrist splint as well as a sling to eval.  Reviewed with patient to not use a sling at home because of risk for increased shoulder and elbow issues.  Patient with increased edema in right wrist and hand , with increased pain 3-6/10 per patient.  Patient with decreased strength and range of motion in all digits as well as wrist in all planes.  Patient was re-educated on scar management as well as use of [employer] scar pad at nighttime. ROM exercises for shoulder to decrease pain and tightness/stiffness and promote greater ROM in UE.  Scars healing well, pt is impressed with how well her forearm scar is healing, scar at carpal tunnel still with some minimal scabbing present and able to initiate scar massage over carpal tunnel.  Pt continues to require cues for exercises.  Patient continues to benefit from skilled OT services to decrease edema and scar tissue as well as pain and increase motion and strength to return to prior level of function using right dominant hand.  PERFORMANCE DEFICITS: in functional skills including ADLs, IADLs, ROM, strength, pain, flexibility, decreased knowledge of use of DME, and UE functional use,   and psychosocial skills including  environmental adaptation and routines and behaviors.   IMPAIRMENTS: are limiting patient from ADLs, IADLs, rest and sleep, play, leisure, and social participation.   COMORBIDITIES: has no other co-morbidities that affects occupational performance. Patient will benefit from skilled OT to address above impairments and improve overall function.  MODIFICATION OR ASSISTANCE TO COMPLETE EVALUATION: No modification of tasks or assist necessary to complete an evaluation.  OT OCCUPATIONAL PROFILE AND HISTORY: Problem focused assessment: Including review of records relating to presenting problem.  CLINICAL DECISION MAKING: LOW - limited treatment options, no task modification necessary  REHAB POTENTIAL: Good for goals  EVALUATION COMPLEXITY: Low   PLAN:  OT FREQUENCY: 1-2x/week  OT DURATION: 8 weeks  PLANNED INTERVENTIONS: 97535 self care/ADL training, 14782 therapeutic exercise, 97140 manual therapy, 97018 paraffin, 95621 fluidotherapy, 97034 contrast bath, 97760 Orthotics management and training, scar mobilization, passive range of motion, patient/family education, and DME and/or AE instructions  CONSULTED AND AGREED WITH  PLAN OF CARE: Patient  Derrek Gu, OTR/L,CLT 10/04/2023, 9:14 AM

## 2023-10-04 ENCOUNTER — Encounter: Payer: Self-pay | Admitting: Occupational Therapy

## 2023-10-04 NOTE — Therapy (Signed)
OUTPATIENT OCCUPATIONAL THERAPY ORTHO TREATMENT NOTE  Patient Name: Heather Blake MRN: [id number] DOB:1961-05-24, 62 y.o., female   PCP: DR Dara Lords PROVIDER: Dr Rosita Kea  END OF SESSION:  OT End of Session - 10/04/23 1906     Visit Number 4    Number of Visits 14    Date for OT Re-Evaluation 11/17/23    OT Start Time 0947    OT Stop Time 1036    OT Time Calculation (min) 49 min    Activity Tolerance Patient tolerated treatment well    Behavior During Therapy WFL for tasks assessed/performed             Past Medical History:  Diagnosis Date   Allergy    Anemia    Cancer (HCC)    Depression    Diverticulosis    FHx: migraine headaches    GERD (gastroesophageal reflux disease)    History of chickenpox    Hypercholesterolemia    Hyperlipidemia    Hypertension    Morbid obesity with BMI of 40.0-44.9, adult (HCC)    Motion sickness    ocean boats   OSA on CPAP    Osteoarthritis    Panic attacks    Sleep apnea    CPAP   Vertigo    none for over 10 yrs   Past Surgical History:  Procedure Laterality Date   ABDOMINAL HYSTERECTOMY     CATARACT EXTRACTION W/PHACO Left 08/11/2017   Procedure: CATARACT EXTRACTION PHACO AND INTRAOCULAR LENS PLACEMENT (IOC);  Surgeon: Galen Manila, MD;  Location: [facility];  Service: Ophthalmology;  Laterality: Left;  [place] 00:31 AP% 19.2 CDE 6.13 Fluid Pack lot # [id number]   CATARACT EXTRACTION W/PHACO Right 09/01/2017   Procedure: CATARACT EXTRACTION PHACO AND INTRAOCULAR LENS PLACEMENT (IOC);  Surgeon: Galen Manila, MD;  Location: [facility];  Service: Ophthalmology;  Laterality: Right;  [place] 00:30 AP% 11.7 CDE3.52 fluid pack lot #[id number]   Child Birth  1983 and 1979   COLONOSCOPY     COLONOSCOPY WITH PROPOFOL N/A 08/25/2018   Procedure: COLONOSCOPY WITH PROPOFOL;  Surgeon: Toledo, Boykin Nearing, MD;  Location: [facility];  Service: Gastroenterology;  Laterality: N/A;   ESOPHAGOGASTRODUODENOSCOPY      ESOPHAGOGASTRODUODENOSCOPY (EGD) WITH PROPOFOL N/A 08/25/2018   Procedure: ESOPHAGOGASTRODUODENOSCOPY (EGD) WITH PROPOFOL;  Surgeon: Toledo, Boykin Nearing, MD;  Location: [facility];  Service: Gastroenterology;  Laterality: N/A;   FRACTURE SURGERY     LAPAROSCOPIC SUPRACERVICAL HYSTERECTOMY  2006   ovaries left in place   left elbow     left knee     Miscarriage  1987   OPEN REDUCTION INTERNAL FIXATION (ORIF) DISTAL RADIAL FRACTURE Right 08/28/2023   Procedure: Open reduction & internal fixation of right distal radius and right carpal tunnel release;  Surgeon: Kennedy Bucker, MD;  Location: [facility];  Service: Orthopedics;  Laterality: Right;   TONSILLECTOMY  1963   TUBAL LIGATION  1993   Patient Active Problem List   Diagnosis Date Noted   Near syncope 03/21/2023   Increased heart rate 02/01/2023   Bronchitis 12/31/2022   Sinus congestion 11/09/2022   Fall 09/14/2022   Cough 09/08/2022   Anemia 05/18/2022   Pre-op evaluation 04/17/2021   Ankle pain, right 12/12/2020   Hyperglycemia 10/09/2018   Sleep apnea 07/01/2018   Healthcare maintenance 09/19/2017   Chest pain 07/25/2017   Localized swelling, mass or lump of neck 06/21/2015   Right hip pain 01/09/2014   GERD (gastroesophageal reflux disease) 12/25/2013   Essential  hypertension, benign 02/10/2013   Hypercholesterolemia 02/10/2013   Diverticulosis 02/10/2013   Anxiety 02/10/2013    ONSET DATE: 09/07/23  REFERRING DIAG: R wrist fx with ORIF and CTR  THERAPY DIAG:  Muscle weakness (generalized)  Stiffness of right hand, not elsewhere classified  Localized edema  Stiffness of right wrist, not elsewhere classified  Scar condition and fibrosis of skin  Rationale for Evaluation and Treatment: Rehabilitation  SUBJECTIVE:   SUBJECTIVE STATEMENT: Pt reports her husband felt bad the other day but is feeling better today.  She has to rely on him for transportation, some days he does not feel well undergoing  cancer treatment.   Pt accompanied by: significant other  PERTINENT HISTORY: 09/14/23 Ortho note : The patient is a 62 year old female who is 2 weeks status post ORIF of the right wrist and carpal tunnel release. Date of surgery was 08/27/2022. Refer to OT for sup /pron and fingers ROM   PRECAUTIONS: Wrist ORIF 08/28/23  WEIGHT BEARING RESTRICTIONS: No  PAIN:  Are you having pain?  3-11/10 in wrist and hand   FALLS: Has patient fallen in last 6 months? Yes. Number of falls 1  LIVING ENVIRONMENT: Lives with: lives with their spouse  PLOF: Husband has terminal cancer.  Patient used to drive and do grocery shopping.  Patient does cooking and laundry and cleaning of the house  PATIENT GOALS: I want my pain and swelling better and get my motion and strength back so that I can drive and go to the grocery store to help my husband as needed  NEXT MD VISIT: 10/05/23  OBJECTIVE:  Note: Objective measures were completed at Evaluation unless otherwise noted.  HAND DOMINANCE: Right   UPPER EXTREMITY ROM:     Active ROM Right eval Left eval  Shoulder flexion    Shoulder abduction    Shoulder adduction    Shoulder extension    Shoulder internal rotation    Shoulder external rotation    Elbow flexion    Elbow extension    Wrist flexion 50   Wrist extension 15   Wrist ulnar deviation 18   Wrist radial deviation 10   Wrist pronation    Wrist supination 65 after contrast 75   (Blank rows = not tested)  Active ROM Right eval Left eval  Thumb MCP (0-60)    Thumb IP (0-80)    Thumb Radial abd/add (0-55) 48    Thumb Palmar abd/add (0-45) 60    Thumb Opposition to Small Finger Opposition to side of 4th     Index MCP (0-90) 75    Index PIP (0-100) 90    Index DIP (0-70)      Long MCP (0-90)  70    Long PIP (0-100)  80    Long DIP (0-70)      Ring MCP (0-90)  60    Ring PIP (0-100)  95 decrease ext     Ring DIP (0-70)      Little MCP (0-90)  80    Little PIP (0-100)  90  decrease ext     Little DIP (0-70)      (Blank rows = not tested)  HAND FUNCTION: NT 3 1/2 wks s/p at eval  COORDINATION: Decrease - in brace - edema and 3 1/2 wks s/p  SENSATION: Numbness reported in the fifth digit as well as decreased sensation in thumb through fourth.  Per patient numbness coming and going and pins-and-needles coming and going.  EDEMA: Right wrist  circumference 17.5 cm and the left 20 cm  COGNITION: Overall cognitive status: Within functional limits for tasks assessed   OBSERVATIONS: Patient arrive with right arm in sling.   TODAY'S TREATMENT:                                                                                                                              DATE: 10/02/23   Contrast:  Pt seen for use of contrast therapy to decrease pain, increase tissue mobility and range of motion.  9 mins total  Manual Therapy:   Following contrast, Pt seen for focus on scar massage to volar forearm to decrease adhesions, improve ROM and decrease pain.   Pt with some small scabbing still over the carpal tunnel.   Pt seen for scar massage techniques over scar on forearm, use of extractor to decrease adhesions and improve scar mobility.  Use of scar pad over carpal tunnel incision to pad over area with light scar massage by therapist. Carpal and metacarpal spreads  Therapeutic Exercises:   Thumb palmar radial abduction,  opposition with focusing on making oval 12 reps each. Patient performing light rolling with foam roller for digit extension as well as soft tissue massage to volar hand prior to tendon glides and to massage over scar on forearm. Blocked tendon glides performed with therapist demonstration and cues followed by active range of motion 12 reps. Active range of motion for supination/pronation Added AAROM wrist for flexion/extension, radial and ulnar deviation for 10 reps.  Shoulder exercises for horizontal ABD and shoulder flexion to decrease tightness  and improve motion, pt with decreased use of right UE for daily tasks due to pain and stiffness from sling use previously.  Continued increased tightness in pectoralis region.  Pt following written/pictorial exercise program via [employer] at home and requires minimal cues for proper form and technique.       Continue with home exercises and program 2-3 times a day to decrease edema and pain.  PATIENT EDUCATION: Education details: findings of eval and HEP  Person educated: Patient Education method: Explanation, Demonstration, Tactile cues, Verbal cues, and Handouts Education comprehension: verbalized understanding, returned demonstration, verbal cues required, and needs further education   GOALS: Goals reviewed with patient? Yes  LONG TERM GOALS: Target date:  8 wks   Patient be independent in home program to decrease edema and pain less than the 3 to increase motion making composite fist. Baseline: Pain 3-11/10 wrist and hand.  No knowledge of home program.  Edema at the wrist increased by 2.5 cm in circumference.  MC flexion 60 to 80 degrees and PIP 80 to 95 degrees. Goal status: INITIAL  2.  Right wrist active range of motion increased in all planes to within functional limits for patient to use right dominant hand and more than 75% of bathing and dressing. Baseline: Patient 7-1/2 weeks postop.  Wrist brace most of the time.  Wrist extension 15 and flexion 50.  With ulnar deviation 18 and radial deviation 10 Goal status: INITIAL  3.  Right supination improved to 85 or more for patient to turn doorknob without increase symptoms. Baseline: Supination 65 degrees with pain on ulnar wrist not able to use hand functionally. Goal status: INITIAL  4.  Right wrist strength in all planes in range improved for patient to push and pull heavy door as well as turn doorknob return to driving and do laundry without increase symptoms Baseline: Patient 3-1/2 weeks postop and a wrist brace still.  No  strength Goal status: INITIAL  5.  Right grip and prehension strength in right hand improved to more than 60% compared to the left for patient to carry more than 5 pounds without increase symptoms as well as pull up pants and socks, turn keep Baseline: Patient 3-1/2 weeks postop and wrist brace Goal status: INITIAL  ASSESSMENT:  CLINICAL IMPRESSION: Patient seen for occupational therapy evaluation for right dominant hand distal radius fracture with ORIF and a carpal tunnel release. Surgery on 08/28/2023.  Patient arrived with hand in a prefab wrist splint as well as a sling to eval.  Reviewed with patient to not use a sling at home because of risk for increased shoulder and elbow issues.  Patient with increased edema in right wrist and hand , with increased pain 3-6/10 per patient.  Patient with decreased strength and range of motion in all digits as well as wrist in all planes.  Patient was re-educated on scar management as well as use of [employer] scar pad at nighttime. ROM exercises for shoulder to decrease pain and tightness/stiffness and promote greater ROM in UE.   Pt continues to require cues for exercises.  Some scabbing still present over carpal tunnel scar and able to do light massage with use of scar pad over area.  Scar on forearm progressing well and tolerating scar massage well. Added wrist exercises this date. Patient continues to benefit from skilled OT services to decrease edema and scar tissue as well as pain and increase motion and strength to return to prior level of function using right dominant hand.  PERFORMANCE DEFICITS: in functional skills including ADLs, IADLs, ROM, strength, pain, flexibility, decreased knowledge of use of DME, and UE functional use,   and psychosocial skills including environmental adaptation and routines and behaviors.   IMPAIRMENTS: are limiting patient from ADLs, IADLs, rest and sleep, play, leisure, and social participation.   COMORBIDITIES: has no  other co-morbidities that affects occupational performance. Patient will benefit from skilled OT to address above impairments and improve overall function.  MODIFICATION OR ASSISTANCE TO COMPLETE EVALUATION: No modification of tasks or assist necessary to complete an evaluation.  OT OCCUPATIONAL PROFILE AND HISTORY: Problem focused assessment: Including review of records relating to presenting problem.  CLINICAL DECISION MAKING: LOW - limited treatment options, no task modification necessary  REHAB POTENTIAL: Good for goals  EVALUATION COMPLEXITY: Low   PLAN:  OT FREQUENCY: 1-2x/week  OT DURATION: 8 weeks  PLANNED INTERVENTIONS: 97535 self care/ADL training, 09811 therapeutic exercise, 97140 manual therapy, 97018 paraffin, 91478 fluidotherapy, 97034 contrast bath, 97760 Orthotics management and training, scar mobilization, passive range of motion, patient/family education, and DME and/or AE instructions  CONSULTED AND AGREED WITH PLAN OF CARE: Patient  Elyzabeth Goatley, OTR/L,CLT 10/04/2023, 7:07 PM

## 2023-10-06 ENCOUNTER — Ambulatory Visit: Payer: Managed Care, Other (non HMO) | Admitting: Occupational Therapy

## 2023-10-06 ENCOUNTER — Encounter: Payer: Self-pay | Admitting: Occupational Therapy

## 2023-10-06 DIAGNOSIS — M25641 Stiffness of right hand, not elsewhere classified: Secondary | ICD-10-CM

## 2023-10-06 DIAGNOSIS — L905 Scar conditions and fibrosis of skin: Secondary | ICD-10-CM

## 2023-10-06 DIAGNOSIS — M25631 Stiffness of right wrist, not elsewhere classified: Secondary | ICD-10-CM

## 2023-10-06 DIAGNOSIS — R600 Localized edema: Secondary | ICD-10-CM

## 2023-10-06 DIAGNOSIS — M6281 Muscle weakness (generalized): Secondary | ICD-10-CM

## 2023-10-06 NOTE — Therapy (Signed)
OUTPATIENT OCCUPATIONAL THERAPY ORTHO TREATMENT NOTE  Patient Name: Heather Blake MRN: [id number] DOB:14-Jan-1961, 62 y.o., female   PCP: DR Dara Lords PROVIDER: Dr Rosita Kea  END OF SESSION:    Past Medical History:  Diagnosis Date   Allergy    Anemia    Cancer (HCC)    Depression    Diverticulosis    FHx: migraine headaches    GERD (gastroesophageal reflux disease)    History of chickenpox    Hypercholesterolemia    Hyperlipidemia    Hypertension    Morbid obesity with BMI of 40.0-44.9, adult (HCC)    Motion sickness    ocean boats   OSA on CPAP    Osteoarthritis    Panic attacks    Sleep apnea    CPAP   Vertigo    none for over 10 yrs   Past Surgical History:  Procedure Laterality Date   ABDOMINAL HYSTERECTOMY     CATARACT EXTRACTION W/PHACO Left 08/11/2017   Procedure: CATARACT EXTRACTION PHACO AND INTRAOCULAR LENS PLACEMENT (IOC);  Surgeon: Galen Manila, MD;  Location: [facility];  Service: Ophthalmology;  Laterality: Left;  [place] 00:31 AP% 19.2 CDE 6.13 Fluid Pack lot # [id number]   CATARACT EXTRACTION W/PHACO Right 09/01/2017   Procedure: CATARACT EXTRACTION PHACO AND INTRAOCULAR LENS PLACEMENT (IOC);  Surgeon: Galen Manila, MD;  Location: [facility];  Service: Ophthalmology;  Laterality: Right;  [place] 00:30 AP% 11.7 CDE3.52 fluid pack lot #[id number]   Child Birth  1983 and 1979   COLONOSCOPY     COLONOSCOPY WITH PROPOFOL N/A 08/25/2018   Procedure: COLONOSCOPY WITH PROPOFOL;  Surgeon: Toledo, Boykin Nearing, MD;  Location: [facility];  Service: Gastroenterology;  Laterality: N/A;   ESOPHAGOGASTRODUODENOSCOPY     ESOPHAGOGASTRODUODENOSCOPY (EGD) WITH PROPOFOL N/A 08/25/2018   Procedure: ESOPHAGOGASTRODUODENOSCOPY (EGD) WITH PROPOFOL;  Surgeon: Toledo, Boykin Nearing, MD;  Location: [facility];  Service: Gastroenterology;  Laterality: N/A;   FRACTURE SURGERY     LAPAROSCOPIC SUPRACERVICAL HYSTERECTOMY  2006   ovaries left in place   left elbow     left  knee     Miscarriage  1987   OPEN REDUCTION INTERNAL FIXATION (ORIF) DISTAL RADIAL FRACTURE Right 08/28/2023   Procedure: Open reduction & internal fixation of right distal radius and right carpal tunnel release;  Surgeon: Kennedy Bucker, MD;  Location: [facility];  Service: Orthopedics;  Laterality: Right;   TONSILLECTOMY  1963   TUBAL LIGATION  1993   Patient Active Problem List   Diagnosis Date Noted   Near syncope 03/21/2023   Increased heart rate 02/01/2023   Bronchitis 12/31/2022   Sinus congestion 11/09/2022   Fall 09/14/2022   Cough 09/08/2022   Anemia 05/18/2022   Pre-op evaluation 04/17/2021   Ankle pain, right 12/12/2020   Hyperglycemia 10/09/2018   Sleep apnea 07/01/2018   Healthcare maintenance 09/19/2017   Chest pain 07/25/2017   Localized swelling, mass or lump of neck 06/21/2015   Right hip pain 01/09/2014   GERD (gastroesophageal reflux disease) 12/25/2013   Essential hypertension, benign 02/10/2013   Hypercholesterolemia 02/10/2013   Diverticulosis 02/10/2013   Anxiety 02/10/2013    ONSET DATE: 09/07/23  REFERRING DIAG: R wrist fx with ORIF and CTR  THERAPY DIAG:  Muscle weakness (generalized)  Stiffness of right hand, not elsewhere classified  Localized edema  Stiffness of right wrist, not elsewhere classified  Scar condition and fibrosis of skin  Rationale for Evaluation and Treatment: Rehabilitation  SUBJECTIVE:   SUBJECTIVE STATEMENT: Pt reports she was  able to drive herself today.  "I went to the doctor and he said I can drive now, he also was very pleased with my progress and felt my scar looked fantastic, I am happy with it." Pt accompanied by: significant other  PERTINENT HISTORY: 09/14/23 Ortho note : The patient is a 62 year old female who is 2 weeks status post ORIF of the right wrist and carpal tunnel release. Date of surgery was 08/28/2023. Refer to OT for sup /pron and fingers ROM   PRECAUTIONS: Wrist ORIF  08/28/23  WEIGHT BEARING RESTRICTIONS: No  PAIN:  Are you having pain?  3/10 in wrist and hand   FALLS: Has patient fallen in last 6 months? Yes. Number of falls 1  LIVING ENVIRONMENT: Lives with: lives with their spouse  PLOF: Husband has terminal cancer.  Patient used to drive and do grocery shopping.  Patient does cooking and laundry and cleaning of the house  PATIENT GOALS: I want my pain and swelling better and get my motion and strength back so that I can drive and go to the grocery store to help my husband as needed  NEXT MD VISIT: 10/05/23  OBJECTIVE:  Note: Objective measures were completed at Evaluation unless otherwise noted.  HAND DOMINANCE: Right   UPPER EXTREMITY ROM:     Active ROM Right eval Right 10/06/23  Shoulder flexion    Shoulder abduction    Shoulder adduction    Shoulder extension    Shoulder internal rotation    Shoulder external rotation    Elbow flexion    Elbow extension    Wrist flexion 50 50  Wrist extension 15 28  Wrist ulnar deviation 18 28  Wrist radial deviation 10 15  Wrist pronation 90 90  Wrist supination 65 after contrast 75 75  (Blank rows = not tested)  Active ROM Right eval   Thumb MCP (0-60)    Thumb IP (0-80)    Thumb Radial abd/add (0-55) 48   Thumb Palmar abd/add (0-45) 60   Thumb Opposition to Small Finger Opposition to side of 4th    Index MCP (0-90) 75   Index PIP (0-100) 90   Index DIP (0-70)     Long MCP (0-90)  70   Long PIP (0-100)  80   Long DIP (0-70)     Ring MCP (0-90)  60   Ring PIP (0-100)  95 decrease ext    Ring DIP (0-70)     Little MCP (0-90)  80   Little PIP (0-100)  90 decrease ext    Little DIP (0-70)     (Blank rows = not tested)  HAND FUNCTION: NT 3 1/2 wks s/p at eval  COORDINATION: Decrease - in brace - edema and 3 1/2 wks s/p  SENSATION: Numbness reported in the fifth digit as well as decreased sensation in thumb through fourth.  Per patient numbness coming and going and  pins-and-needles coming and going.  EDEMA: Right wrist circumference 17.5 cm and the left 20 cm  COGNITION: Overall cognitive status: Within functional limits for tasks assessed   OBSERVATIONS: Patient arrive with right arm in sling.   TODAY'S TREATMENT:  DATE: 10/06/23   Contrast:  Pt seen for use of contrast therapy to decrease pain, increase tissue mobility and range of motion.  9 mins total  Manual Therapy:   Following contrast, Pt seen for focus on scar massage to volar forearm to decrease adhesions, improve ROM and decrease pain.   Pt continues to demonstrate some small scabbing still over the carpal tunnel, therapy debrided one area of loose skin to allow improved healing.   Pt seen for scar massage techniques over scar on forearm, use of extractor to decrease adhesions and improve scar mobility (extractor not use on carpal tunnel scar.  Use of scar pad over carpal tunnel incision to pad over area with light scar massage by therapist. Carpal and metacarpal spreads to encourage lymphatic flow and decrease edema.   Therapeutic Exercises:   Thumb palmar/radial abduction,  opposition with focusing on making oval 12 reps each. Blocked tendon glides performed with therapist demonstration and cues followed by active range of motion 12 reps. Active range of motion for supination/pronation with focus on supination this date.   AAROM wrist for flexion/extension, radial and ulnar deviation for 10 reps.  Shoulder exercises for horizontal ABD and shoulder flexion to decrease tightness and improve motion, pt with decreased use of right UE for daily tasks due to pain and stiffness from sling use previously.  Pt following written/pictorial exercise program via [employer] at home and requires minimal cues for proper form and technique.       Continue with home exercises and  program 2-3 times a day to decrease edema and pain, continue with contrast, foam for rolling on forearm, wrist and hand, focus on supination of the forearm and wrist extension over the next few days.  PATIENT EDUCATION: Education details: findings of eval and HEP  Person educated: Patient Education method: Explanation, Demonstration, Tactile cues, Verbal cues, and Handouts Education comprehension: verbalized understanding, returned demonstration, verbal cues required, and needs further education   GOALS: Goals reviewed with patient? Yes  LONG TERM GOALS: Target date:  8 wks   Patient be independent in home program to decrease edema and pain less than the 3 to increase motion making composite fist. Baseline: Pain 3-11/10 wrist and hand.  No knowledge of home program.  Edema at the wrist increased by 2.5 cm in circumference.  MC flexion 60 to 80 degrees and PIP 80 to 95 degrees. Goal status: INITIAL  2.  Right wrist active range of motion increased in all planes to within functional limits for patient to use right dominant hand and more than 75% of bathing and dressing. Baseline: Patient 7-1/2 weeks postop.  Wrist brace most of the time.  Wrist extension 15 and flexion 50.  With ulnar deviation 18 and radial deviation 10 Goal status: INITIAL  3.  Right supination improved to 85 or more for patient to turn doorknob without increase symptoms. Baseline: Supination 65 degrees with pain on ulnar wrist not able to use hand functionally. Goal status: INITIAL  4.  Right wrist strength in all planes in range improved for patient to push and pull heavy door as well as turn doorknob return to driving and do laundry without increase symptoms Baseline: Patient 3-1/2 weeks postop and a wrist brace still.  No strength Goal status: INITIAL  5.  Right grip and prehension strength in right hand improved to more than 60% compared to the left for patient to carry more than 5 pounds without increase symptoms  as well as pull up pants  and socks, turn keep Baseline: Patient 3-1/2 weeks postop and wrist brace Goal status: INITIAL  ASSESSMENT:  CLINICAL IMPRESSION: Patient seen for occupational therapy evaluation for right dominant hand distal radius fracture with ORIF and a carpal tunnel release. Surgery on 08/28/2023.  Patient arrived with hand in a prefab wrist splint as well as a sling to eval.  Reviewed with patient to not use a sling at home because of risk for increased shoulder and elbow issues.   Patient with decreased strength and range of motion in all digits as well as wrist in all planes.  Patient was re-educated on scar management as well as use of [employer] scar pad at nighttime. ROM exercises for shoulder to decrease pain and tightness/stiffness and promote greater ROM in UE.   Pt continues to require cues for exercises.  Some scabbing still present over carpal tunnel scar and able to do light massage with use of scar pad over area, tolerating well.  Continued focus on supination of the forearm and wrist exercises for flexion/extension and RD/UD.  Some debridement of dead skin around the carpal tunnel scar area to promote increased healing.   Patient continues to benefit from skilled OT services to decrease edema and scar tissue as well as pain and increase motion and strength to return to prior level of function using right dominant hand.  PERFORMANCE DEFICITS: in functional skills including ADLs, IADLs, ROM, strength, pain, flexibility, decreased knowledge of use of DME, and UE functional use,   and psychosocial skills including environmental adaptation and routines and behaviors.   IMPAIRMENTS: are limiting patient from ADLs, IADLs, rest and sleep, play, leisure, and social participation.   COMORBIDITIES: has no other co-morbidities that affects occupational performance. Patient will benefit from skilled OT to address above impairments and improve overall function.  MODIFICATION OR  ASSISTANCE TO COMPLETE EVALUATION: No modification of tasks or assist necessary to complete an evaluation.  OT OCCUPATIONAL PROFILE AND HISTORY: Problem focused assessment: Including review of records relating to presenting problem.  CLINICAL DECISION MAKING: LOW - limited treatment options, no task modification necessary  REHAB POTENTIAL: Good for goals  EVALUATION COMPLEXITY: Low   PLAN:  OT FREQUENCY: 1-2x/week  OT DURATION: 8 weeks  PLANNED INTERVENTIONS: 97535 self care/ADL training, 09604 therapeutic exercise, 97140 manual therapy, 97018 paraffin, 54098 fluidotherapy, 97034 contrast bath, 97760 Orthotics management and training, scar mobilization, passive range of motion, patient/family education, and DME and/or AE instructions  CONSULTED AND AGREED WITH PLAN OF CARE: Patient  Derrek Gu, OTR/L,CLT 10/09/2023, 6:35 PM

## 2023-10-08 ENCOUNTER — Ambulatory Visit: Payer: Managed Care, Other (non HMO) | Admitting: Occupational Therapy

## 2023-10-08 DIAGNOSIS — L905 Scar conditions and fibrosis of skin: Secondary | ICD-10-CM

## 2023-10-08 DIAGNOSIS — M25631 Stiffness of right wrist, not elsewhere classified: Secondary | ICD-10-CM

## 2023-10-08 DIAGNOSIS — M25641 Stiffness of right hand, not elsewhere classified: Secondary | ICD-10-CM

## 2023-10-08 DIAGNOSIS — M6281 Muscle weakness (generalized): Secondary | ICD-10-CM

## 2023-10-08 DIAGNOSIS — R600 Localized edema: Secondary | ICD-10-CM

## 2023-10-09 ENCOUNTER — Encounter: Payer: Self-pay | Admitting: Occupational Therapy

## 2023-10-09 NOTE — Therapy (Signed)
OUTPATIENT OCCUPATIONAL THERAPY ORTHO TREATMENT NOTE  Patient Name: Heather Blake MRN: [id number] DOB:[birth year], 62 y.o., female  PCP: DR Dara Lords PROVIDER: Dr Rosita Kea  END OF SESSION:  OT End of Session - 10/09/23 2151     Visit Number 6    Number of Visits 14    Date for OT Re-Evaluation 11/17/23    OT Start Time 1405    OT Stop Time 1445    OT Time Calculation (min) 40 min    Activity Tolerance Patient tolerated treatment well    Behavior During Therapy WFL for tasks assessed/performed              Past Medical History:  Diagnosis Date   Allergy    Anemia    Cancer (HCC)    Depression    Diverticulosis    FHx: migraine headaches    GERD (gastroesophageal reflux disease)    History of chickenpox    Hypercholesterolemia    Hyperlipidemia    Hypertension    Morbid obesity with BMI of 40.0-44.9, adult (HCC)    Motion sickness    ocean boats   OSA on CPAP    Osteoarthritis    Panic attacks    Sleep apnea    CPAP   Vertigo    none for over 10 yrs   Past Surgical History:  Procedure Laterality Date   ABDOMINAL HYSTERECTOMY     CATARACT EXTRACTION W/PHACO Left 08/11/2017   Procedure: CATARACT EXTRACTION PHACO AND INTRAOCULAR LENS PLACEMENT (IOC);  Surgeon: Galen Manila, MD;  Location: [facility];  Service: Ophthalmology;  Laterality: Left;  [place] 00:31 AP% 19.2 CDE 6.13 Fluid Pack lot # [id number]   CATARACT EXTRACTION W/PHACO Right 09/01/2017   Procedure: CATARACT EXTRACTION PHACO AND INTRAOCULAR LENS PLACEMENT (IOC);  Surgeon: Galen Manila, MD;  Location: [facility];  Service: Ophthalmology;  Laterality: Right;  [place] 00:30 AP% 11.7 CDE3.52 fluid pack lot #[id number]   Child Birth  1983 and 1981   COLONOSCOPY     COLONOSCOPY WITH PROPOFOL N/A 08/25/2018   Procedure: COLONOSCOPY WITH PROPOFOL;  Surgeon: Toledo, Boykin Nearing, MD;  Location: [facility];  Service: Gastroenterology;  Laterality: N/A;   ESOPHAGOGASTRODUODENOSCOPY      ESOPHAGOGASTRODUODENOSCOPY (EGD) WITH PROPOFOL N/A 08/25/2018   Procedure: ESOPHAGOGASTRODUODENOSCOPY (EGD) WITH PROPOFOL;  Surgeon: Toledo, Boykin Nearing, MD;  Location: [facility];  Service: Gastroenterology;  Laterality: N/A;   FRACTURE SURGERY     LAPAROSCOPIC SUPRACERVICAL HYSTERECTOMY  2006   ovaries left in place   left elbow     left knee     Miscarriage  1987   OPEN REDUCTION INTERNAL FIXATION (ORIF) DISTAL RADIAL FRACTURE Right 08/28/2023   Procedure: Open reduction & internal fixation of right distal radius and right carpal tunnel release;  Surgeon: Kennedy Bucker, MD;  Location: [facility];  Service: Orthopedics;  Laterality: Right;   TONSILLECTOMY  1963   TUBAL LIGATION  1993   Patient Active Problem List   Diagnosis Date Noted   Near syncope 03/21/2023   Increased heart rate 02/01/2023   Bronchitis 12/31/2022   Sinus congestion 11/09/2022   Fall 09/14/2022   Cough 09/08/2022   Anemia 05/18/2022   Pre-op evaluation 04/17/2021   Ankle pain, right 12/12/2020   Hyperglycemia 10/09/2018   Sleep apnea 07/01/2018   Healthcare maintenance 09/19/2017   Chest pain 07/25/2017   Localized swelling, mass or lump of neck 06/21/2015   Right hip pain 01/09/2014   GERD (gastroesophageal reflux disease) 12/25/2013   Essential  hypertension, benign 02/10/2013   Hypercholesterolemia 02/10/2013   Diverticulosis 02/10/2013   Anxiety 02/10/2013    ONSET DATE: 09/07/23  REFERRING DIAG: R wrist fx with ORIF and CTR  THERAPY DIAG:  Muscle weakness (generalized)  Localized edema  Scar condition and fibrosis of skin  Stiffness of right hand, not elsewhere classified  Stiffness of right wrist, not elsewhere classified  Rationale for Evaluation and Treatment: Rehabilitation  SUBJECTIVE:   SUBJECTIVE STATEMENT: Pt reports she had a quiet holiday with her husband.  She reports decreased pain and has been working on her scar and range of motion at home.  Reports the scab  on her wrist came off and healing well.  Pt accompanied by: significant other  PERTINENT HISTORY: 09/14/23 Ortho note : The patient is a 62 year old female who is 2 weeks status post ORIF of the right wrist and carpal tunnel release. Date of surgery was 08/28/2023. Refer to OT for sup /pron and fingers ROM   PRECAUTIONS: Wrist ORIF 08/28/23  WEIGHT BEARING RESTRICTIONS: No  PAIN:  Are you having pain?  3/10 in wrist and hand   FALLS: Has patient fallen in last 6 months? Yes. Number of falls 1  LIVING ENVIRONMENT: Lives with: lives with their spouse  PLOF: Husband has terminal cancer.  Patient used to drive and do grocery shopping.  Patient does cooking and laundry and cleaning of the house  PATIENT GOALS: I want my pain and swelling better and get my motion and strength back so that I can drive and go to the grocery store to help my husband as needed  NEXT MD VISIT: 10/05/23  OBJECTIVE:  Note: Objective measures were completed at Evaluation unless otherwise noted.  HAND DOMINANCE: Right   UPPER EXTREMITY ROM:     Active ROM Right eval Right 10/06/23  Shoulder flexion    Shoulder abduction    Shoulder adduction    Shoulder extension    Shoulder internal rotation    Shoulder external rotation    Elbow flexion    Elbow extension    Wrist flexion 50 50  Wrist extension 15 28  Wrist ulnar deviation 18 28  Wrist radial deviation 10 15  Wrist pronation 90 90  Wrist supination 65 after contrast 75 75  (Blank rows = not tested)  Active ROM Right eval   Thumb MCP (0-60)    Thumb IP (0-80)    Thumb Radial abd/add (0-55) 48   Thumb Palmar abd/add (0-45) 60   Thumb Opposition to Small Finger Opposition to side of 4th    Index MCP (0-90) 75 75  Index PIP (0-100) 90 90  Index DIP (0-70)     Long MCP (0-90)  70 75  Long PIP (0-100)  80 90  Long DIP (0-70)     Ring MCP (0-90)  60 70  Ring PIP (0-100)  95 decrease ext  90  Ring DIP (0-70)     Little MCP (0-90)  80 80   Little PIP (0-100)  90 decrease ext  90  Little DIP (0-70)     (Blank rows = not tested)  HAND FUNCTION: NT 3 1/2 wks s/p at eval  COORDINATION: Decrease - in brace - edema and 3 1/2 wks s/p  SENSATION: Numbness reported in the fifth digit as well as decreased sensation in thumb through fourth.  Per patient numbness coming and going and pins-and-needles coming and going.  EDEMA: Right wrist circumference 17.5 cm and the left 20 cm  COGNITION:  Overall cognitive status: Within functional limits for tasks assessed   OBSERVATIONS: Patient arrive with right arm in sling to eval TODAY'S TREATMENT:                                                                                                                              DATE: 10/08/23   Scab on wrist scar has now fallen off and she has 2 small pin point areas which are still open and healing.  She is pleased with how it is looking as well as the scar on her forearm.  Contrast:  Pt seen for use of contrast therapy to decrease pain, increase tissue mobility and range of motion.  9 mins total, performed prior to manual therapy.  Manual Therapy:   Pt seen for focus on scar massage to volar forearm to decrease adhesions, improve ROM and decrease pain.  Scar at carpal tunnel with significant improvement since last session after instruction to leave bandaid off the area (some maceration present last time).  2 small areas remain open but healing.  Scar massage on the carpal tunnel scar around closed area.  Pt seen for scar massage techniques over scar on forearm, use of extractor to decrease adhesions and improve scar mobility (extractor not use on carpal tunnel scar). Carpal and metacarpal spreads to encourage lymphatic flow and decrease edema.   Therapeutic Exercises:   Thumb palmar/radial abduction,  opposition with focusing on making oval 12 reps each. Tendon glides performed with cues followed by active range of motion 12 reps. Active  range of motion for supination/pronation with focus on supination with end range stretch. AAROM wrist for flexion/extension, radial and ulnar deviation for 10 reps.  Added tabletop wrist stretches for wrist this date for flex/ext, UD, RD with written instructions. Shoulder exercises for horizontal ABD and shoulder flexion to decrease tightness and improve motion, 2 sets 12 reps Continue with home exercises and program 2-3 times a day to decrease edema and pain, continue with contrast, foam for rolling on forearm, wrist and hand, continued focus on supination of the forearm and wrist extension over the next few days.  PATIENT EDUCATION: Education details: findings of eval and HEP  Person educated: Patient Education method: Explanation, Demonstration, Tactile cues, Verbal cues, and Handouts Education comprehension: verbalized understanding, returned demonstration, verbal cues required, and needs further education   GOALS: Goals reviewed with patient? Yes  LONG TERM GOALS: Target date:  8 wks   Patient be independent in home program to decrease edema and pain less than the 3 to increase motion making composite fist. Baseline: Pain 3-11/10 wrist and hand.  No knowledge of home program.  Edema at the wrist increased by 2.5 cm in circumference.  MC flexion 60 to 80 degrees and PIP 80 to 95 degrees. Goal status: INITIAL  2.  Right wrist active range of motion increased in all planes to within functional limits for patient to use right dominant hand and more  than 75% of bathing and dressing. Baseline: Patient 7-1/2 weeks postop.  Wrist brace most of the time.  Wrist extension 15 and flexion 50.  With ulnar deviation 18 and radial deviation 10 Goal status: INITIAL  3.  Right supination improved to 85 or more for patient to turn doorknob without increase symptoms. Baseline: Supination 65 degrees with pain on ulnar wrist not able to use hand functionally. Goal status: INITIAL  4.  Right wrist  strength in all planes in range improved for patient to push and pull heavy door as well as turn doorknob return to driving and do laundry without increase symptoms Baseline: Patient 3-1/2 weeks postop and a wrist brace still.  No strength Goal status: INITIAL  5.  Right grip and prehension strength in right hand improved to more than 60% compared to the left for patient to carry more than 5 pounds without increase symptoms as well as pull up pants and socks, turn keep Baseline: Patient 3-1/2 weeks postop and wrist brace Goal status: INITIAL  ASSESSMENT:  CLINICAL IMPRESSION: Patient seen for occupational therapy evaluation for right dominant hand distal radius fracture with ORIF and a carpal tunnel release. Surgery on 08/28/2023.  Patient arrived with hand in a prefab wrist splint as well as a sling to eval.  Reviewed with patient to not use a sling at home because of risk for increased shoulder and elbow issues.   Patient with decreased strength and range of motion in all digits as well as wrist in all planes.  Patient was re-educated on scar management as well as use of [employer] scar pad at nighttime. ROM exercises for shoulder to decrease pain and tightness/stiffness and promote greater ROM in UE.  Pt continues to require occasional cues for exercises.  Continued focus on supination of the forearm and wrist exercises for flexion/extension and RD/UD.  Scar on carpal tunnel greatly improved this date, no more scabbing but still has 2 pin point areas that are healing and need to fully close.  Pt able to demonstrate wrist exercises on tabletop to increase motion. Patient continues to benefit from skilled OT services to decrease edema and scar tissue as well as pain and increase motion and strength to return to prior level of function using right dominant hand.  PERFORMANCE DEFICITS: in functional skills including ADLs, IADLs, ROM, strength, pain, flexibility, decreased knowledge of use of DME, and UE  functional use,   and psychosocial skills including environmental adaptation and routines and behaviors.   IMPAIRMENTS: are limiting patient from ADLs, IADLs, rest and sleep, play, leisure, and social participation.   COMORBIDITIES: has no other co-morbidities that affects occupational performance. Patient will benefit from skilled OT to address above impairments and improve overall function.  MODIFICATION OR ASSISTANCE TO COMPLETE EVALUATION: No modification of tasks or assist necessary to complete an evaluation.  OT OCCUPATIONAL PROFILE AND HISTORY: Problem focused assessment: Including review of records relating to presenting problem.  CLINICAL DECISION MAKING: LOW - limited treatment options, no task modification necessary  REHAB POTENTIAL: Good for goals  EVALUATION COMPLEXITY: Low   PLAN:  OT FREQUENCY: 1-2x/week  OT DURATION: 8 weeks  PLANNED INTERVENTIONS: 97535 self care/ADL training, 16109 therapeutic exercise, 97140 manual therapy, 97018 paraffin, 60454 fluidotherapy, 97034 contrast bath, 97760 Orthotics management and training, scar mobilization, passive range of motion, patient/family education, and DME and/or AE instructions  CONSULTED AND AGREED WITH PLAN OF CARE: Patient  Khup Sapia, OTR/L,CLT 10/09/2023, 9:52 PM

## 2023-10-13 ENCOUNTER — Ambulatory Visit: Payer: Managed Care, Other (non HMO) | Admitting: Occupational Therapy

## 2023-10-13 ENCOUNTER — Encounter: Payer: Self-pay | Admitting: Occupational Therapy

## 2023-10-13 DIAGNOSIS — R600 Localized edema: Secondary | ICD-10-CM

## 2023-10-13 DIAGNOSIS — M6281 Muscle weakness (generalized): Secondary | ICD-10-CM

## 2023-10-13 DIAGNOSIS — M25631 Stiffness of right wrist, not elsewhere classified: Secondary | ICD-10-CM

## 2023-10-13 DIAGNOSIS — L905 Scar conditions and fibrosis of skin: Secondary | ICD-10-CM

## 2023-10-13 DIAGNOSIS — M25641 Stiffness of right hand, not elsewhere classified: Secondary | ICD-10-CM | POA: Diagnosis not present

## 2023-10-13 NOTE — Therapy (Signed)
 OUTPATIENT OCCUPATIONAL THERAPY ORTHO TREATMENT NOTE  Patient Name: Heather Blake MRN: [id number] DOB:May 28, 1961, 63 y.o., female  PCP: DR Glendia MART PROVIDER: Dr Kathlynn  END OF SESSION:  OT End of Session - 10/13/23 1455     Visit Number 7    Number of Visits 14    Date for OT Re-Evaluation 11/17/23    OT Start Time 1445    OT Stop Time 1539    OT Time Calculation (min) 54 min    Activity Tolerance Patient tolerated treatment well    Behavior During Therapy WFL for tasks assessed/performed              Past Medical History:  Diagnosis Date   Allergy    Anemia    Cancer (HCC)    Depression    Diverticulosis    FHx: migraine headaches    GERD (gastroesophageal reflux disease)    History of chickenpox    Hypercholesterolemia    Hyperlipidemia    Hypertension    Morbid obesity with BMI of 40.0-44.9, adult (HCC)    Motion sickness    ocean boats   OSA on CPAP    Osteoarthritis    Panic attacks    Sleep apnea    CPAP   Vertigo    none for over 10 yrs   Past Surgical History:  Procedure Laterality Date   ABDOMINAL HYSTERECTOMY     CATARACT EXTRACTION W/PHACO Left 08/11/2017   Procedure: CATARACT EXTRACTION PHACO AND INTRAOCULAR LENS PLACEMENT (IOC);  Surgeon: Jaye Fallow, MD;  Location: [facility];  Service: Ophthalmology;  Laterality: Left;  US  00:31 AP% 19.2 CDE 6.13 Fluid Pack lot # [id number]   CATARACT EXTRACTION W/PHACO Right 09/01/2017   Procedure: CATARACT EXTRACTION PHACO AND INTRAOCULAR LENS PLACEMENT (IOC);  Surgeon: Jaye Fallow, MD;  Location: [facility];  Service: Ophthalmology;  Laterality: Right;  US  00:30 AP% 11.7 CDE3.52 fluid pack lot #[id number]   Child Birth  1983 and 1979   COLONOSCOPY     COLONOSCOPY WITH PROPOFOL  N/A 08/25/2018   Procedure: COLONOSCOPY WITH PROPOFOL ;  Surgeon: Toledo, Ladell POUR, MD;  Location: [facility];  Service: Gastroenterology;  Laterality: N/A;   ESOPHAGOGASTRODUODENOSCOPY      ESOPHAGOGASTRODUODENOSCOPY (EGD) WITH PROPOFOL  N/A 08/25/2018   Procedure: ESOPHAGOGASTRODUODENOSCOPY (EGD) WITH PROPOFOL ;  Surgeon: Toledo, Ladell POUR, MD;  Location: [facility];  Service: Gastroenterology;  Laterality: N/A;   FRACTURE SURGERY     LAPAROSCOPIC SUPRACERVICAL HYSTERECTOMY  2006   ovaries left in place   left elbow     left knee     Miscarriage  1987   OPEN REDUCTION INTERNAL FIXATION (ORIF) DISTAL RADIAL FRACTURE Right 08/28/2023   Procedure: Open reduction & internal fixation of right distal radius and right carpal tunnel release;  Surgeon: Kathlynn Sharper, MD;  Location: [facility];  Service: Orthopedics;  Laterality: Right;   TONSILLECTOMY  1963   TUBAL LIGATION  1993   Patient Active Problem List   Diagnosis Date Noted   Near syncope 03/21/2023   Increased heart rate 02/01/2023   Bronchitis 12/31/2022   Sinus congestion 11/09/2022   Fall 09/14/2022   Cough 09/08/2022   Anemia 05/18/2022   Pre-op evaluation 04/17/2021   Ankle pain, right 12/12/2020   Hyperglycemia 10/09/2018   Sleep apnea 07/01/2018   Healthcare maintenance 09/19/2017   Chest pain 07/25/2017   Localized swelling, mass or lump of neck 06/21/2015   Right hip pain 01/09/2014   GERD (gastroesophageal reflux disease) 12/25/2013   Essential  hypertension, benign 02/10/2013   Hypercholesterolemia 02/10/2013   Diverticulosis 02/10/2013   Anxiety 02/10/2013    ONSET DATE: 09/07/23  REFERRING DIAG: R wrist fx with ORIF and CTR  THERAPY DIAG:  Muscle weakness (generalized)  Scar condition and fibrosis of skin  Stiffness of right wrist, not elsewhere classified  Localized edema  Stiffness of right hand, not elsewhere classified  Rationale for Evaluation and Treatment: Rehabilitation  SUBJECTIVE:   SUBJECTIVE STATEMENT: Pt reports some mild pain in the wrist 3/10 with movement.  The scar on her wrist still has 2 small open areas, one scabbed over, she reports the area broke open  a day ago and she put a bandaid on it.   Pt accompanied by: significant other  PERTINENT HISTORY: 09/14/23 Ortho note : The patient is a 62 year old female who is 2 weeks status post ORIF of the right wrist and carpal tunnel release. Date of surgery was 08/28/2023. Refer to OT for sup /pron and fingers ROM   PRECAUTIONS: Wrist ORIF 08/28/23  WEIGHT BEARING RESTRICTIONS: No  PAIN:  Are you having pain?  3/10 in wrist and hand   FALLS: Has patient fallen in last 6 months? Yes. Number of falls 1  LIVING ENVIRONMENT: Lives with: lives with their spouse  PLOF: Husband has terminal cancer.  Patient used to drive and do grocery shopping.  Patient does cooking and laundry and cleaning of the house  PATIENT GOALS: I want my pain and swelling better and get my motion and strength back so that I can drive and go to the grocery store to help my husband as needed  NEXT MD VISIT: 10/05/23  OBJECTIVE:  Note: Objective measures were completed at Evaluation unless otherwise noted.  HAND DOMINANCE: Right   UPPER EXTREMITY ROM:     Active ROM Right eval Right 10/06/23  Shoulder flexion    Shoulder abduction    Shoulder adduction    Shoulder extension    Shoulder internal rotation    Shoulder external rotation    Elbow flexion    Elbow extension    Wrist flexion 50 50  Wrist extension 15 28  Wrist ulnar deviation 18 28  Wrist radial deviation 10 15  Wrist pronation 90 90  Wrist supination 65 after contrast 75 75  (Blank rows = not tested)  Active ROM Right eval   Thumb MCP (0-60)    Thumb IP (0-80)    Thumb Radial abd/add (0-55) 48   Thumb Palmar abd/add (0-45) 60   Thumb Opposition to Small Finger Opposition to side of 4th    Index MCP (0-90) 75 75  Index PIP (0-100) 90 90  Index DIP (0-70)     Long MCP (0-90)  70 75  Long PIP (0-100)  80 90  Long DIP (0-70)     Ring MCP (0-90)  60 70  Ring PIP (0-100)  95 decrease ext  90  Ring DIP (0-70)     Little MCP (0-90)  80 80   Little PIP (0-100)  90 decrease ext  90  Little DIP (0-70)     (Blank rows = not tested)  HAND FUNCTION: NT 3 1/2 wks s/p at eval  COORDINATION: Decrease - in brace - edema and 3 1/2 wks s/p  SENSATION: Numbness reported in the fifth digit as well as decreased sensation in thumb through fourth.  Per patient numbness coming and going and pins-and-needles coming and going.  EDEMA: Right wrist circumference 17.5 cm and the left 20  cm  COGNITION: Overall cognitive status: Within functional limits for tasks assessed   OBSERVATIONS: Patient arrive with right arm in sling to eval TODAY'S TREATMENT:                                                                                                                              DATE: 10/13/23   Pt with 2 areas on carpal tunnel area scar, one scabbed over, the other she reports started to bleed a little the other day so she put a bandaid on it.  Removed the bandaid and area with some maceration, recommend she keep the band aid off or use sparingly so the area can dry and heal.  Otherwise she is doing well, some pain at the wrist and continues to lack motion especially for wrist extension.    Contrast:  Pt seen for use of contrast therapy to decrease pain, increase tissue mobility and range of motion.  9 mins total, performed prior to manual therapy.  Manual Therapy:   Pt seen for focus on scar massage to volar forearm to decrease adhesions, improve ROM and decrease pain.   2 small areas remain at carpal tunnel area, one open but healing, the other scabbed over.  Scar massage on the carpal tunnel scar around closed area.  Pt seen for scar massage techniques over scar on forearm, use of extractor to decrease adhesions and improve scar mobility (extractor not use on carpal tunnel scar), use of mini massager to scar on forearm.  Carpal and metacarpal spreads to encourage lymphatic flow and decrease edema.   Therapeutic Exercises:   Thumb palmar/radial  abduction,  opposition with focusing on making oval 12 reps each. Tendon glides performed with cues followed by active range of motion 12 reps. Active range of motion for supination/pronation with focus on supination with end range stretch. AAROM wrist for flexion/extension, radial and ulnar deviation for 12 reps.  Tabletop wrist stretches for wrist for flex/ext, UD, RD with written instructions. Shoulder exercises for horizontal ABD and shoulder flexion to decrease tightness and improve motion, 2-3 sets 12 reps Continue with home exercises and program 2-3 times a day to decrease edema and pain, continue with contrast, foam for rolling on forearm, wrist and hand, continued focus on supination of the forearm and wrist extension.   Pt continues to require verbal and tactile cues for exercises for proper form and technique.    PATIENT EDUCATION: Education details: findings of eval and HEP  Person educated: Patient Education method: Explanation, Demonstration, Tactile cues, Verbal cues, and Handouts Education comprehension: verbalized understanding, returned demonstration, verbal cues required, and needs further education   GOALS: Goals reviewed with patient? Yes  LONG TERM GOALS: Target date:  8 wks   Patient be independent in home program to decrease edema and pain less than the 3 to increase motion making composite fist. Baseline: Pain 3-11/10 wrist and hand.  No knowledge of home program.  Edema at the  wrist increased by 2.5 cm in circumference.  MC flexion 60 to 80 degrees and PIP 80 to 95 degrees. Goal status: INITIAL  2.  Right wrist active range of motion increased in all planes to within functional limits for patient to use right dominant hand and more than 75% of bathing and dressing. Baseline: Patient 7-1/2 weeks postop.  Wrist brace most of the time.  Wrist extension 15 and flexion 50.  With ulnar deviation 18 and radial deviation 10 Goal status: INITIAL  3.  Right supination  improved to 85 or more for patient to turn doorknob without increase symptoms. Baseline: Supination 65 degrees with pain on ulnar wrist not able to use hand functionally. Goal status: INITIAL  4.  Right wrist strength in all planes in range improved for patient to push and pull heavy door as well as turn doorknob return to driving and do laundry without increase symptoms Baseline: Patient 3-1/2 weeks postop and a wrist brace still.  No strength Goal status: INITIAL  5.  Right grip and prehension strength in right hand improved to more than 60% compared to the left for patient to carry more than 5 pounds without increase symptoms as well as pull up pants and socks, turn keep Baseline: Patient 3-1/2 weeks postop and wrist brace Goal status: INITIAL  ASSESSMENT:  CLINICAL IMPRESSION: Patient seen for occupational therapy evaluation for right dominant hand distal radius fracture with ORIF and a carpal tunnel release. Surgery on 08/28/2023.  Patient arrived with hand in a prefab wrist splint as well as a sling to eval.  Reviewed with patient to not use a sling at home because of risk for increased shoulder and elbow issues.   Patient with decreased strength and range of motion in all digits as well as wrist in all planes.  Patient was re-educated on scar management as well as use of [employer] -[employer] scar pad at nighttime. ROM exercises for shoulder to decrease pain and tightness/stiffness and promote greater ROM in UE.  Pt continues to require occasional cues for exercises.  Continued focus on supination of the forearm and wrist exercises for flexion/extension and RD/UD.  Now:  Scar on carpal tunnel with one scab and the other area a small pin point area pt reports was bleeding yesterday, she put a bandaid over it, some maceration present, recommend she leave the bandaid off for drying and healing.  Pt able to demonstrate wrist exercises on tabletop to increase motion but continues to require cues for proper  form and technique. Pt remains limited with active ROM of the wrist especially for extension and will need to continue to focus on this area at home and in the clinic. Patient continues to benefit from skilled OT services to decrease edema and scar tissue as well as pain and increase motion and strength to return to prior level of function using right dominant hand.  PERFORMANCE DEFICITS: in functional skills including ADLs, IADLs, ROM, strength, pain, flexibility, decreased knowledge of use of DME, and UE functional use,   and psychosocial skills including environmental adaptation and routines and behaviors.   IMPAIRMENTS: are limiting patient from ADLs, IADLs, rest and sleep, play, leisure, and social participation.   COMORBIDITIES: has no other co-morbidities that affects occupational performance. Patient will benefit from skilled OT to address above impairments and improve overall function.  MODIFICATION OR ASSISTANCE TO COMPLETE EVALUATION: No modification of tasks or assist necessary to complete an evaluation.  OT OCCUPATIONAL PROFILE AND HISTORY: Problem focused assessment: Including  review of records relating to presenting problem.  CLINICAL DECISION MAKING: LOW - limited treatment options, no task modification necessary  REHAB POTENTIAL: Good for goals  EVALUATION COMPLEXITY: Low   PLAN:  OT FREQUENCY: 1-2x/week  OT DURATION: 8 weeks  PLANNED INTERVENTIONS: 97535 self care/ADL training, 02889 therapeutic exercise, 97140 manual therapy, 97018 paraffin, 02960 fluidotherapy, 97034 contrast bath, 97760 Orthotics management and training, scar mobilization, passive range of motion, patient/family education, and DME and/or AE instructions  CONSULTED AND AGREED WITH PLAN OF CARE: Patient  Shelbie Franken, OTR/L,CLT 10/13/2023, 7:40 PM

## 2023-10-16 ENCOUNTER — Encounter: Payer: Self-pay | Admitting: Occupational Therapy

## 2023-10-16 ENCOUNTER — Ambulatory Visit: Payer: Managed Care, Other (non HMO) | Attending: Orthopedic Surgery | Admitting: Occupational Therapy

## 2023-10-16 DIAGNOSIS — M25631 Stiffness of right wrist, not elsewhere classified: Secondary | ICD-10-CM | POA: Diagnosis present

## 2023-10-16 DIAGNOSIS — R600 Localized edema: Secondary | ICD-10-CM | POA: Diagnosis present

## 2023-10-16 DIAGNOSIS — M6281 Muscle weakness (generalized): Secondary | ICD-10-CM | POA: Insufficient documentation

## 2023-10-16 DIAGNOSIS — L905 Scar conditions and fibrosis of skin: Secondary | ICD-10-CM | POA: Insufficient documentation

## 2023-10-16 DIAGNOSIS — M25641 Stiffness of right hand, not elsewhere classified: Secondary | ICD-10-CM | POA: Insufficient documentation

## 2023-10-16 NOTE — Therapy (Signed)
 OUTPATIENT OCCUPATIONAL THERAPY ORTHO TREATMENT NOTE  Patient Name: Heather Blake MRN: [id number] DOB:[birth year], 63 y.o., female  PCP: DR Glendia MART PROVIDER: Dr Kathlynn  END OF SESSION:  OT End of Session - 10/16/23 1810     Visit Number 8    Number of Visits 14    Date for OT Re-Evaluation 11/17/23    OT Start Time 1111    OT Stop Time 1210    OT Time Calculation (min) 59 min    Activity Tolerance Patient tolerated treatment well    Behavior During Therapy WFL for tasks assessed/performed              Past Medical History:  Diagnosis Date   Allergy    Anemia    Cancer (HCC)    Depression    Diverticulosis    FHx: migraine headaches    GERD (gastroesophageal reflux disease)    History of chickenpox    Hypercholesterolemia    Hyperlipidemia    Hypertension    Morbid obesity with BMI of 40.0-44.9, adult (HCC)    Motion sickness    ocean boats   OSA on CPAP    Osteoarthritis    Panic attacks    Sleep apnea    CPAP   Vertigo    none for over 10 yrs   Past Surgical History:  Procedure Laterality Date   ABDOMINAL HYSTERECTOMY     CATARACT EXTRACTION W/PHACO Left 08/11/2017   Procedure: CATARACT EXTRACTION PHACO AND INTRAOCULAR LENS PLACEMENT (IOC);  Surgeon: Jaye Fallow, MD;  Location: [facility];  Service: Ophthalmology;  Laterality: Left;  US  00:31 AP% 19.2 CDE 6.13 Fluid Pack lot # [id number]   CATARACT EXTRACTION W/PHACO Right 09/01/2017   Procedure: CATARACT EXTRACTION PHACO AND INTRAOCULAR LENS PLACEMENT (IOC);  Surgeon: Jaye Fallow, MD;  Location: [facility];  Service: Ophthalmology;  Laterality: Right;  US  00:30 AP% 11.7 CDE3.52 fluid pack lot #[id number]   Child Birth  1983 and 1981   COLONOSCOPY     COLONOSCOPY WITH PROPOFOL  N/A 08/25/2018   Procedure: COLONOSCOPY WITH PROPOFOL ;  Surgeon: Toledo, Ladell POUR, MD;  Location: [facility];  Service: Gastroenterology;  Laterality: N/A;   ESOPHAGOGASTRODUODENOSCOPY      ESOPHAGOGASTRODUODENOSCOPY (EGD) WITH PROPOFOL  N/A 08/25/2018   Procedure: ESOPHAGOGASTRODUODENOSCOPY (EGD) WITH PROPOFOL ;  Surgeon: Toledo, Ladell POUR, MD;  Location: [facility];  Service: Gastroenterology;  Laterality: N/A;   FRACTURE SURGERY     LAPAROSCOPIC SUPRACERVICAL HYSTERECTOMY  2006   ovaries left in place   left elbow     left knee     Miscarriage  1987   OPEN REDUCTION INTERNAL FIXATION (ORIF) DISTAL RADIAL FRACTURE Right 08/28/2023   Procedure: Open reduction & internal fixation of right distal radius and right carpal tunnel release;  Surgeon: Kathlynn Sharper, MD;  Location: [facility];  Service: Orthopedics;  Laterality: Right;   TONSILLECTOMY  1963   TUBAL LIGATION  1993   Patient Active Problem List   Diagnosis Date Noted   Near syncope 03/21/2023   Increased heart rate 02/01/2023   Bronchitis 12/31/2022   Sinus congestion 11/09/2022   Fall 09/14/2022   Cough 09/08/2022   Anemia 05/18/2022   Pre-op evaluation 04/17/2021   Ankle pain, right 12/12/2020   Hyperglycemia 10/09/2018   Sleep apnea 07/01/2018   Healthcare maintenance 09/19/2017   Chest pain 07/25/2017   Localized swelling, mass or lump of neck 06/21/2015   Right hip pain 01/09/2014   GERD (gastroesophageal reflux disease) 12/25/2013   Essential  hypertension, benign 02/10/2013   Hypercholesterolemia 02/10/2013   Diverticulosis 02/10/2013   Anxiety 02/10/2013    ONSET DATE: 09/07/23  REFERRING DIAG: R wrist fx with ORIF and CTR  THERAPY DIAG:  Muscle weakness (generalized)  Scar condition and fibrosis of skin  Localized edema  Stiffness of right wrist, not elsewhere classified  Stiffness of right hand, not elsewhere classified  Rationale for Evaluation and Treatment: Rehabilitation  SUBJECTIVE:   SUBJECTIVE STATEMENT: Pt reports she is doing well, scar on wrist is improving and 2 small areas are closing and remained scabbed over.  Pt accompanied by: significant  other  PERTINENT HISTORY: 09/14/23 Ortho note : The patient is a 63 year old female who is 2 weeks status post ORIF of the right wrist and carpal tunnel release. Date of surgery was 08/28/2023. Refer to OT for sup /pron and fingers ROM   PRECAUTIONS: Wrist ORIF 08/28/23  WEIGHT BEARING RESTRICTIONS: No  PAIN:  Are you having pain?  3/10 in wrist and hand   FALLS: Has patient fallen in last 6 months? Yes. Number of falls 1  LIVING ENVIRONMENT: Lives with: lives with their spouse  PLOF: Husband has terminal cancer.  Patient used to drive and do grocery shopping.  Patient does cooking and laundry and cleaning of the house  PATIENT GOALS: I want my pain and swelling better and get my motion and strength back so that I can drive and go to the grocery store to help my husband as needed  NEXT MD VISIT: 10/05/23  OBJECTIVE:  Note: Objective measures were completed at Evaluation unless otherwise noted.  HAND DOMINANCE: Right   UPPER EXTREMITY ROM:     Active ROM Right eval Right 10/06/23 R 10/16/23  Shoulder flexion     Shoulder abduction     Shoulder adduction     Shoulder extension     Shoulder internal rotation     Shoulder external rotation     Elbow flexion     Elbow extension     Wrist flexion 50 50 52  Wrist extension 15 28 30   Wrist ulnar deviation 18 28 20   Wrist radial deviation 10 15 20   Wrist pronation 90 90 90  Wrist supination 65 after contrast 75 75 80  (Blank rows = not tested)  Active ROM Right eval R 10/06/23 R 10/16/2023  Thumb MCP (0-60)     Thumb IP (0-80)     Thumb Radial abd/add (0-55) 48    Thumb Palmar abd/add (0-45) 60    Thumb Opposition to Small Finger Opposition to side of 4th     Index MCP (0-90) 75 75 80  Index PIP (0-100) 90 90 90  Index DIP (0-70)      Long MCP (0-90)  70 75 80  Long PIP (0-100)  80 90 95  Long DIP (0-70)      Ring MCP (0-90)  60 70 75  Ring PIP (0-100)  95 decrease ext  90 95  Ring DIP (0-70)      Little MCP  (0-90)  80 80 80  Little PIP (0-100)  90 decrease ext  90 90  Little DIP (0-70)      (Blank rows = not tested)  HAND FUNCTION: NT 3 1/2 wks s/p at eval  COORDINATION: Decrease - in brace - edema and 3 1/2 wks s/p  SENSATION: Numbness reported in the fifth digit as well as decreased sensation in thumb through fourth.  Per patient numbness coming and going and pins-and-needles  coming and going.  EDEMA: Right wrist circumference 17.5 cm and the left 20 cm  COGNITION: Overall cognitive status: Within functional limits for tasks assessed   OBSERVATIONS: Patient arrive with right arm in sling to eval TODAY'S TREATMENT:                                                                                                                              DATE: 1/030/24   Pt reports 3/10 pain this date in the wrist, scabbing minimal over carpal tunnel scar and closed with no bleeding this date.   Contrast:  Pt seen for use of contrast therapy to decrease edema, decrease pain, increase tissue mobility and range of motion.  9 mins total, performed prior to manual therapy.  Manual Therapy:   Pt seen for focus on scar massage to volar forearm to decrease adhesions, improve ROM and decrease pain.   2 small areas remain at carpal tunnel area, scabbed over.  Scar massage on the carpal tunnel scar with use of cica care over scabbed areas with light massage, scar massage around area with no scabbing.  Pt seen for scar massage techniques over scar on forearm, use of extractor to decrease adhesions and improve scar mobility (extractor not use on carpal tunnel scar), use of mini massager to scar on forearm.  Carpal and metacarpal spreads to encourage lymphatic flow and decrease edema.   Therapeutic Exercises:   Thumb palmar/radial abduction, opposition with focusing on making oval 12 reps each. Tendon glides performed with cues followed by active range of motion 12 reps. Active range of motion for  supination/pronation with focus on supination with end range stretch. AAROM wrist for flexion/extension, radial and ulnar deviation for 12 reps.  Prayer stretch for increased wrist extension, 12 reps, 3-5 sec hold Tabletop wrist stretches for wrist for flex/ext, UD, RD with written instructions. Shoulder exercises for horizontal ABD and shoulder flexion to decrease tightness and improve motion, 2-3 sets 12 reps Measurements taken this date see flowsheet for details  Continue with home exercises and program 2-3 times a day to decrease edema and pain, continue with contrast, foam for rolling on forearm, wrist and hand, continued focus on supination of the forearm and wrist extension.   Pt continues to require verbal and tactile cues for exercises for proper form and technique.    PATIENT EDUCATION: Education details: findings of eval and HEP  Person educated: Patient Education method: Explanation, Demonstration, Tactile cues, Verbal cues, and Handouts Education comprehension: verbalized understanding, returned demonstration, verbal cues required, and needs further education   GOALS: Goals reviewed with patient? Yes  LONG TERM GOALS: Target date:  8 wks   Patient be independent in home program to decrease edema and pain less than the 3 to increase motion making composite fist. Baseline: Pain 3-11/10 wrist and hand.  No knowledge of home program.  Edema at the wrist increased by 2.5 cm in circumference.  MC flexion 60 to  80 degrees and PIP 80 to 95 degrees. Goal status: ONGOING  2.  Right wrist active range of motion increased in all planes to within functional limits for patient to use right dominant hand and more than 75% of bathing and dressing. Baseline: Patient 7-1/2 weeks postop.  Wrist brace most of the time.  Wrist extension 15 and flexion 50.  With ulnar deviation 18 and radial deviation 10 Goal status: ONGOING  3.  Right supination improved to 85 or more for patient to turn  doorknob without increase symptoms. Baseline: Supination 65 degrees with pain on ulnar wrist not able to use hand functionally. Goal status: ONGOING  4.  Right wrist strength in all planes in range improved for patient to push and pull heavy door as well as turn doorknob return to driving and do laundry without increase symptoms Baseline: Patient 3-1/2 weeks postop and a wrist brace still.  No strength Goal status: ONGOING  5.  Right grip and prehension strength in right hand improved to more than 60% compared to the left for patient to carry more than 5 pounds without increase symptoms as well as pull up pants and socks, turn keep Baseline: Patient 3-1/2 weeks postop and wrist brace Goal status: ONGOING  ASSESSMENT:  CLINICAL IMPRESSION: Patient seen for occupational therapy evaluation for right dominant hand distal radius fracture with ORIF and a carpal tunnel release. Surgery on 08/28/2023.  Patient arrived with hand in a prefab wrist splint as well as a sling to eval.  Reviewed with patient to not use a sling at home because of risk for increased shoulder and elbow issues.   Patient with decreased strength and range of motion in all digits as well as wrist in all planes.  Patient was re-educated on scar management as well as use of Cica -Care scar pad at nighttime. ROM exercises for shoulder to decrease pain and tightness/stiffness and promote greater ROM in UE.  Pt continues to require occasional cues for exercises.  Continued focus on supination of the forearm and wrist exercises for flexion/extension and RD/UD.  Now:  Scar on carpal tunnel with 2 scabbed areas but closed now.  Measurements taken this date for ROM with improvements noted but still lacks increased motion for wrist extension.  Pt performing exercises at home and will need to continue to work towards improving end range supination and wrist extension.  Pt able to demonstrate wrist exercises on tabletop to increase motion but  continues to require cues for proper form and technique. Patient continues to benefit from skilled OT services to decrease edema and scar tissue as well as pain and increase motion and strength to return to prior level of function using right dominant hand.  PERFORMANCE DEFICITS: in functional skills including ADLs, IADLs, ROM, strength, pain, flexibility, decreased knowledge of use of DME, and UE functional use,   and psychosocial skills including environmental adaptation and routines and behaviors.   IMPAIRMENTS: are limiting patient from ADLs, IADLs, rest and sleep, play, leisure, and social participation.   COMORBIDITIES: has no other co-morbidities that affects occupational performance. Patient will benefit from skilled OT to address above impairments and improve overall function.  MODIFICATION OR ASSISTANCE TO COMPLETE EVALUATION: No modification of tasks or assist necessary to complete an evaluation.  OT OCCUPATIONAL PROFILE AND HISTORY: Problem focused assessment: Including review of records relating to presenting problem.  CLINICAL DECISION MAKING: LOW - limited treatment options, no task modification necessary  REHAB POTENTIAL: Good for goals  EVALUATION COMPLEXITY: Low  PLAN:  OT FREQUENCY: 1-2x/week  OT DURATION: 8 weeks  PLANNED INTERVENTIONS: 97535 self care/ADL training, 02889 therapeutic exercise, 97140 manual therapy, 97018 paraffin, 02960 fluidotherapy, 97034 contrast bath, 97760 Orthotics management and training, scar mobilization, passive range of motion, patient/family education, and DME and/or AE instructions  CONSULTED AND AGREED WITH PLAN OF CARE: Patient  Beza Steppe, OTR/L,CLT 10/16/2023, 7:32 PM

## 2023-10-20 ENCOUNTER — Ambulatory Visit: Payer: Managed Care, Other (non HMO) | Admitting: Occupational Therapy

## 2023-10-20 DIAGNOSIS — M6281 Muscle weakness (generalized): Secondary | ICD-10-CM

## 2023-10-20 DIAGNOSIS — L905 Scar conditions and fibrosis of skin: Secondary | ICD-10-CM

## 2023-10-20 DIAGNOSIS — M25631 Stiffness of right wrist, not elsewhere classified: Secondary | ICD-10-CM

## 2023-10-20 DIAGNOSIS — R600 Localized edema: Secondary | ICD-10-CM

## 2023-10-20 DIAGNOSIS — M25641 Stiffness of right hand, not elsewhere classified: Secondary | ICD-10-CM

## 2023-10-20 NOTE — Therapy (Signed)
 OUTPATIENT OCCUPATIONAL THERAPY ORTHO TREATMENT NOTE  Patient Name: Heather Blake MRN: [id number] DOB:15-Feb-1961, 63 y.o., female  PCP: DR Glendia MART PROVIDER: Dr Kathlynn  END OF SESSION:  OT End of Session - 10/20/23 1614     Visit Number 9    Number of Visits 14    Date for OT Re-Evaluation 11/17/23    OT Start Time 1530    OT Stop Time 1614    OT Time Calculation (min) 44 min    Activity Tolerance Patient tolerated treatment well    Behavior During Therapy WFL for tasks assessed/performed              Past Medical History:  Diagnosis Date   Allergy    Anemia    Cancer (HCC)    Depression    Diverticulosis    FHx: migraine headaches    GERD (gastroesophageal reflux disease)    History of chickenpox    Hypercholesterolemia    Hyperlipidemia    Hypertension    Morbid obesity with BMI of 40.0-44.9, adult (HCC)    Motion sickness    ocean boats   OSA on CPAP    Osteoarthritis    Panic attacks    Sleep apnea    CPAP   Vertigo    none for over 10 yrs   Past Surgical History:  Procedure Laterality Date   ABDOMINAL HYSTERECTOMY     CATARACT EXTRACTION W/PHACO Left 08/11/2017   Procedure: CATARACT EXTRACTION PHACO AND INTRAOCULAR LENS PLACEMENT (IOC);  Surgeon: Jaye Fallow, MD;  Location: [facility];  Service: Ophthalmology;  Laterality: Left;  US  00:31 AP% 19.2 CDE 6.13 Fluid Pack lot # [id number]   CATARACT EXTRACTION W/PHACO Right 09/01/2017   Procedure: CATARACT EXTRACTION PHACO AND INTRAOCULAR LENS PLACEMENT (IOC);  Surgeon: Jaye Fallow, MD;  Location: [facility];  Service: Ophthalmology;  Laterality: Right;  US  00:30 AP% 11.7 CDE3.52 fluid pack lot #[id number]   Child Birth  1983 and 1979   COLONOSCOPY     COLONOSCOPY WITH PROPOFOL  N/A 08/25/2018   Procedure: COLONOSCOPY WITH PROPOFOL ;  Surgeon: Toledo, Ladell POUR, MD;  Location: [facility];  Service: Gastroenterology;  Laterality: N/A;   ESOPHAGOGASTRODUODENOSCOPY      ESOPHAGOGASTRODUODENOSCOPY (EGD) WITH PROPOFOL  N/A 08/25/2018   Procedure: ESOPHAGOGASTRODUODENOSCOPY (EGD) WITH PROPOFOL ;  Surgeon: Toledo, Ladell POUR, MD;  Location: [facility];  Service: Gastroenterology;  Laterality: N/A;   FRACTURE SURGERY     LAPAROSCOPIC SUPRACERVICAL HYSTERECTOMY  2006   ovaries left in place   left elbow     left knee     Miscarriage  1987   OPEN REDUCTION INTERNAL FIXATION (ORIF) DISTAL RADIAL FRACTURE Right 08/28/2023   Procedure: Open reduction & internal fixation of right distal radius and right carpal tunnel release;  Surgeon: Kathlynn Sharper, MD;  Location: [facility];  Service: Orthopedics;  Laterality: Right;   TONSILLECTOMY  1963   TUBAL LIGATION  1993   Patient Active Problem List   Diagnosis Date Noted   Near syncope 03/21/2023   Increased heart rate 02/01/2023   Bronchitis 12/31/2022   Sinus congestion 11/09/2022   Fall 09/14/2022   Cough 09/08/2022   Anemia 05/18/2022   Pre-op evaluation 04/17/2021   Ankle pain, right 12/12/2020   Hyperglycemia 10/09/2018   Sleep apnea 07/01/2018   Healthcare maintenance 09/19/2017   Chest pain 07/25/2017   Localized swelling, mass or lump of neck 06/21/2015   Right hip pain 01/09/2014   GERD (gastroesophageal reflux disease) 12/25/2013   Essential  hypertension, benign 02/10/2013   Hypercholesterolemia 02/10/2013   Diverticulosis 02/10/2013   Anxiety 02/10/2013    ONSET DATE: 09/07/23  REFERRING DIAG: R wrist fx with ORIF and CTR  THERAPY DIAG:  Muscle weakness (generalized)  Scar condition and fibrosis of skin  Localized edema  Stiffness of right wrist, not elsewhere classified  Stiffness of right hand, not elsewhere classified  Rationale for Evaluation and Treatment: Rehabilitation  SUBJECTIVE:   SUBJECTIVE STATEMENT: I am doing okay not really pain.  But if I try to lift anything heavy I can feel it.  And switch hands.  Like picking up a glass plate or carrying a tray is  hard.  Pt accompanied by: significant other  PERTINENT HISTORY: 09/14/23 Ortho note : The patient is a 63 year old female who is 2 weeks status post ORIF of the right wrist and carpal tunnel release. Date of surgery was 08/28/2023. Refer to OT for sup /pron and fingers ROM   PRECAUTIONS: Wrist ORIF 08/28/23  WEIGHT BEARING RESTRICTIONS: No  PAIN:  Are you having pain?  3/10 in wrist and hand   FALLS: Has patient fallen in last 6 months? Yes. Number of falls 1  LIVING ENVIRONMENT: Lives with: lives with their spouse  PLOF: Husband has terminal cancer.  Patient used to drive and do grocery shopping.  Patient does cooking and laundry and cleaning of the house  PATIENT GOALS: I want my pain and swelling better and get my motion and strength back so that I can drive and go to the grocery store to help my husband as needed  NEXT MD VISIT: 10/05/23  OBJECTIVE:  Note: Objective measures were completed at Evaluation unless otherwise noted.  HAND DOMINANCE: Right   UPPER EXTREMITY ROM:     Active ROM Right eval Right 10/06/23 R 10/16/23 R 10/20/23  Shoulder flexion      Shoulder abduction      Shoulder adduction      Shoulder extension      Shoulder internal rotation      Shoulder external rotation      Elbow flexion      Elbow extension      Wrist flexion 50 50 52 55  Wrist extension 15 28 30  40  Wrist ulnar deviation 18 28 20 25   Wrist radial deviation 10 15 20 20   Wrist pronation 90 90 90 90  Wrist supination 65 after contrast 75 75 80 80  (Blank rows = not tested)  Active ROM Right eval R 10/06/23 R 10/16/2023  Thumb MCP (0-60)     Thumb IP (0-80)     Thumb Radial abd/add (0-55) 48    Thumb Palmar abd/add (0-45) 60    Thumb Opposition to Small Finger Opposition to side of 4th     Index MCP (0-90) 75 75 80  Index PIP (0-100) 90 90 90  Index DIP (0-70)      Long MCP (0-90)  70 75 80  Long PIP (0-100)  80 90 95  Long DIP (0-70)      Ring MCP (0-90)  60 70 75  Ring  PIP (0-100)  95 decrease ext  90 95  Ring DIP (0-70)      Little MCP (0-90)  80 80 80  Little PIP (0-100)  90 decrease ext  90 90  Little DIP (0-70)      (Blank rows = not tested)  HAND FUNCTION: R grip10 lbs, L 60 lbs, lat pinch R 7, L 13 lbs, and  3 point pinch R 4 lbs and L 14 lbs   COORDINATION: Decrease - in brace - edema and 3 1/2 wks s/p  SENSATION: Numbness reported in the fifth digit as well as decreased sensation in thumb through fourth.  Per patient numbness coming and going and pins-and-needles coming and going.  EDEMA: Right wrist circumference 17.5 cm and the left 20 cm  COGNITION: Overall cognitive status: Within functional limits for tasks assessed   OBSERVATIONS: Patient arrive with right arm in sling to eval TODAY'S TREATMENT:                                                                                                                              DATE: 10/20/23   Patient report most of discomfort or pain is in the wrist when trying to use it. Continue to have edema on the wrist more than 1-1/2 cm. Continues to have pins-and-needles in all fingertips. Stiffness with composite fist with a pull over dorsal metacarpals  Contrast:  Pt seen for use of contrast therapy to decrease edema, decrease pain, increase tissue mobility and range of motion.  9 mins total, performed prior to manual therapy.  Manual Therapy:   Metacarpal spreads done 15 reps as well as soft tissue massage to webspace.  Video recorddr for husband to assist patient after contrast at home to open carpal tunnel as well as webspace prior to active assisted range of motion for thumb palmar radial abduction  Therapeutic Exercises:   Thumb palmar/radial abduction AAROM  opposition with focusing on making oval 12 reps each. Tendon glides  active range of motion 12 reps. With place and hold composite fist 12 resp Add and reviewed with patient passive range of motion for supination end range Followed  by active range of motion for supination/pronation with focus on supination  AAROM wrist for flexion/extension, radial and ulnar deviation for 12 reps.  Over edge of table reviewed with patient again Prayer stretch for increased wrist extension, 12 reps, 3-5 sec hold Reviewed and add 1 pound weight for supination pronation, radial and ulnar deviation and flexion extension of the wrist 12 reps 2 times a day after passive range of motion stretches  measurements taken this date see flowsheet for details for range of motion and grip and prehension  Attempted light blue putty for gripping but was too difficult. Initiated light yellow foam block gripping 12 reps to 15 reps 2 times a day add to home program pain-free  Continue with home exercises and program 2-3 times a day to decrease edema and sensory changes continue with contrast,  Range of motion stretches prior to 1 pound weight  Fitted patient with a Benik neoprene splint to use at home during the day to give some support but allow more motion. Patient was able to carry 4 pounds with no pain.  But lifting compensating with upper arm and shoulder.   PATIENT EDUCATION: Education details: findings of eval and HEP  Person educated: Patient Education method: Explanation, Demonstration, Tactile cues, Verbal cues, and Handouts Education comprehension: verbalized understanding, returned demonstration, verbal cues required, and needs further education   GOALS: Goals reviewed with patient? Yes  LONG TERM GOALS: Target date:  8 wks   Patient be independent in home program to decrease edema and pain less than the 3 to increase motion making composite fist. Baseline: Pain 3-11/10 wrist and hand.  No knowledge of home program.  Edema at the wrist increased by 2.5 cm in circumference.  MC flexion 60 to 80 degrees and PIP 80 to 95 degrees. Goal status: ONGOING  2.  Right wrist active range of motion increased in all planes to within functional limits  for patient to use right dominant hand and more than 75% of bathing and dressing. Baseline: Patient 7-1/2 weeks postop.  Wrist brace most of the time.  Wrist extension 15 and flexion 50.  With ulnar deviation 18 and radial deviation 10 Goal status: ONGOING  3.  Right supination improved to 85 or more for patient to turn doorknob without increase symptoms. Baseline: Supination 65 degrees with pain on ulnar wrist not able to use hand functionally. Goal status: ONGOING  4.  Right wrist strength in all planes in range improved for patient to push and pull heavy door as well as turn doorknob return to driving and do laundry without increase symptoms Baseline: Patient 3-1/2 weeks postop and a wrist brace still.  No strength Goal status: ONGOING  5.  Right grip and prehension strength in right hand improved to more than 60% compared to the left for patient to carry more than 5 pounds without increase symptoms as well as pull up pants and socks, turn keep Baseline: Patient 3-1/2 weeks postop and wrist brace Goal status: ONGOING  ASSESSMENT:  CLINICAL IMPRESSION: Patient seen for occupational therapy evaluation for right dominant hand distal radius fracture with ORIF and a carpal tunnel release. Surgery on 08/28/2023.  Patient arrived with hand in a prefab wrist splint as well as a sling to eval.  Reviewed with patient to not use a sling at home because of risk for increased shoulder and elbow issues.   Patient with decreased strength and range of motion in all digits as well as wrist in all planes.  Patient made good progress from start of care but continues to be limited mostly in wrist extension and flexion as well as endrange supination.  Continues to have increased scar tissue at carpal tunnel as well as distal volar wrist scar limiting wrist extension.  Reviewed with patient again passive range of motion stretches and initiated 1 pound weight for wrist and forearm in all planes.  Patient was fitted  with a Benik neoprene splint to use during the day to allow more range of motion as well as composite flexion of the digits.  Patient with some stiffness with composite flexion over dorsal metacarpals.  Initiated gripping light foam block for grip strength.  Was unable to do any putty.   Patient continues to benefit from skilled OT services to decrease edema and scar tissue as well as pain and increase motion and strength to return to prior level of function using right dominant hand.  PERFORMANCE DEFICITS: in functional skills including ADLs, IADLs, ROM, strength, pain, flexibility, decreased knowledge of use of DME, and UE functional use,   and psychosocial skills including environmental adaptation and routines and behaviors.   IMPAIRMENTS: are limiting patient from ADLs, IADLs, rest and sleep, play, leisure,  and social participation.   COMORBIDITIES: has no other co-morbidities that affects occupational performance. Patient will benefit from skilled OT to address above impairments and improve overall function.  MODIFICATION OR ASSISTANCE TO COMPLETE EVALUATION: No modification of tasks or assist necessary to complete an evaluation.  OT OCCUPATIONAL PROFILE AND HISTORY: Problem focused assessment: Including review of records relating to presenting problem.  CLINICAL DECISION MAKING: LOW - limited treatment options, no task modification necessary  REHAB POTENTIAL: Good for goals  EVALUATION COMPLEXITY: Low   PLAN:  OT FREQUENCY: 1-2x/week  OT DURATION: 8 weeks  PLANNED INTERVENTIONS: 97535 self care/ADL training, 02889 therapeutic exercise, 97140 manual therapy, 97018 paraffin, 02960 fluidotherapy, 97034 contrast bath, 97760 Orthotics management and training, scar mobilization, passive range of motion, patient/family education, and DME and/or AE instructions  CONSULTED AND AGREED WITH PLAN OF CARE: Patient  Ancel Peters, OTR/L,CLT 10/20/2023, 4:15 PM

## 2023-10-23 ENCOUNTER — Ambulatory Visit: Payer: Managed Care, Other (non HMO) | Admitting: Occupational Therapy

## 2023-10-23 DIAGNOSIS — R600 Localized edema: Secondary | ICD-10-CM

## 2023-10-23 DIAGNOSIS — L905 Scar conditions and fibrosis of skin: Secondary | ICD-10-CM

## 2023-10-23 DIAGNOSIS — M6281 Muscle weakness (generalized): Secondary | ICD-10-CM

## 2023-10-23 DIAGNOSIS — M25641 Stiffness of right hand, not elsewhere classified: Secondary | ICD-10-CM

## 2023-10-23 DIAGNOSIS — M25631 Stiffness of right wrist, not elsewhere classified: Secondary | ICD-10-CM

## 2023-10-23 NOTE — Therapy (Signed)
 OUTPATIENT OCCUPATIONAL THERAPY ORTHO TREATMENT NOTE / 10th visit  Patient Name: Heather Blake MRN: [id number] DOB:07/27/1961, 63 y.o., female  PCP: DR Glendia MART PROVIDER: Dr Kathlynn  END OF SESSION:  OT End of Session - 10/23/23 1144     Visit Number 10    Number of Visits 14    Date for OT Re-Evaluation 11/17/23    OT Start Time 1031    OT Stop Time 1120    OT Time Calculation (min) 49 min    Activity Tolerance Patient tolerated treatment well    Behavior During Therapy WFL for tasks assessed/performed              Past Medical History:  Diagnosis Date   Allergy    Anemia    Cancer (HCC)    Depression    Diverticulosis    FHx: migraine headaches    GERD (gastroesophageal reflux disease)    History of chickenpox    Hypercholesterolemia    Hyperlipidemia    Hypertension    Morbid obesity with BMI of 40.0-44.9, adult (HCC)    Motion sickness    ocean boats   OSA on CPAP    Osteoarthritis    Panic attacks    Sleep apnea    CPAP   Vertigo    none for over 10 yrs   Past Surgical History:  Procedure Laterality Date   ABDOMINAL HYSTERECTOMY     CATARACT EXTRACTION W/PHACO Left 08/11/2017   Procedure: CATARACT EXTRACTION PHACO AND INTRAOCULAR LENS PLACEMENT (IOC);  Surgeon: Jaye Fallow, MD;  Location: [facility];  Service: Ophthalmology;  Laterality: Left;  US  00:31 AP% 19.2 CDE 6.13 Fluid Pack lot # [id number]   CATARACT EXTRACTION W/PHACO Right 09/01/2017   Procedure: CATARACT EXTRACTION PHACO AND INTRAOCULAR LENS PLACEMENT (IOC);  Surgeon: Jaye Fallow, MD;  Location: [facility];  Service: Ophthalmology;  Laterality: Right;  US  00:30 AP% 11.7 CDE3.52 fluid pack lot #[id number]   Child Birth  1983 and 1979   COLONOSCOPY     COLONOSCOPY WITH PROPOFOL  N/A 08/25/2018   Procedure: COLONOSCOPY WITH PROPOFOL ;  Surgeon: Toledo, Ladell POUR, MD;  Location: [facility];  Service: Gastroenterology;  Laterality: N/A;   ESOPHAGOGASTRODUODENOSCOPY      ESOPHAGOGASTRODUODENOSCOPY (EGD) WITH PROPOFOL  N/A 08/25/2018   Procedure: ESOPHAGOGASTRODUODENOSCOPY (EGD) WITH PROPOFOL ;  Surgeon: Toledo, Ladell POUR, MD;  Location: [facility];  Service: Gastroenterology;  Laterality: N/A;   FRACTURE SURGERY     LAPAROSCOPIC SUPRACERVICAL HYSTERECTOMY  2006   ovaries left in place   left elbow     left knee     Miscarriage  1987   OPEN REDUCTION INTERNAL FIXATION (ORIF) DISTAL RADIAL FRACTURE Right 08/28/2023   Procedure: Open reduction & internal fixation of right distal radius and right carpal tunnel release;  Surgeon: Kathlynn Sharper, MD;  Location: [facility];  Service: Orthopedics;  Laterality: Right;   TONSILLECTOMY  1963   TUBAL LIGATION  1993   Patient Active Problem List   Diagnosis Date Noted   Near syncope 03/21/2023   Increased heart rate 02/01/2023   Bronchitis 12/31/2022   Sinus congestion 11/09/2022   Fall 09/14/2022   Cough 09/08/2022   Anemia 05/18/2022   Pre-op evaluation 04/17/2021   Ankle pain, right 12/12/2020   Hyperglycemia 10/09/2018   Sleep apnea 07/01/2018   Healthcare maintenance 09/19/2017   Chest pain 07/25/2017   Localized swelling, mass or lump of neck 06/21/2015   Right hip pain 01/09/2014   GERD (gastroesophageal reflux disease) 12/25/2013  Essential hypertension, benign 02/10/2013   Hypercholesterolemia 02/10/2013   Diverticulosis 02/10/2013   Anxiety 02/10/2013    ONSET DATE: 09/07/23  REFERRING DIAG: R wrist fx with ORIF and CTR  THERAPY DIAG:  Muscle weakness (generalized)  Scar condition and fibrosis of skin  Localized edema  Stiffness of right wrist, not elsewhere classified  Stiffness of right hand, not elsewhere classified  Rationale for Evaluation and Treatment: Rehabilitation  SUBJECTIVE:   SUBJECTIVE STATEMENT: My husband helps me with my exercises and starting the soft tissue massage too- did okay with gripping the yellow foam block - still somewhat hard to do  Pt  accompanied by: significant other  PERTINENT HISTORY: 09/14/23 Ortho note : The patient is a 63 year old female who is 2 weeks status post ORIF of the right wrist and carpal tunnel release. Date of surgery was 08/28/2023. Refer to OT for sup /pron and fingers ROM   PRECAUTIONS: Wrist ORIF 08/28/23  WEIGHT BEARING RESTRICTIONS: No  PAIN:  Are you having pain?  1/10 in wrist   FALLS: Has patient fallen in last 6 months? Yes. Number of falls 1  LIVING ENVIRONMENT: Lives with: lives with their spouse  PLOF: Husband has terminal cancer.  Patient used to drive and do grocery shopping.  Patient does cooking and laundry and cleaning of the house  PATIENT GOALS: I want my pain and swelling better and get my motion and strength back so that I can drive and go to the grocery store to help my husband as needed  NEXT MD VISIT: 10/05/23  OBJECTIVE:  Note: Objective measures were completed at Evaluation unless otherwise noted.  HAND DOMINANCE: Right   UPPER EXTREMITY ROM:     Active ROM Right eval Right 10/06/23 R 10/16/23 R 10/20/23  Shoulder flexion      Shoulder abduction      Shoulder adduction      Shoulder extension      Shoulder internal rotation      Shoulder external rotation      Elbow flexion      Elbow extension      Wrist flexion 50 50 52 55  Wrist extension 15 28 30  40  Wrist ulnar deviation 18 28 20 25   Wrist radial deviation 10 15 20 20   Wrist pronation 90 90 90 90  Wrist supination 65 after contrast 75 75 80 80  (Blank rows = not tested)  Active ROM Right eval R 10/06/23 R 10/16/2023  Thumb MCP (0-60)     Thumb IP (0-80)     Thumb Radial abd/add (0-55) 48    Thumb Palmar abd/add (0-45) 60    Thumb Opposition to Small Finger Opposition to side of 4th     Index MCP (0-90) 75 75 80  Index PIP (0-100) 90 90 90  Index DIP (0-70)      Long MCP (0-90)  70 75 80  Long PIP (0-100)  80 90 95  Long DIP (0-70)      Ring MCP (0-90)  60 70 75  Ring PIP (0-100)  95  decrease ext  90 95  Ring DIP (0-70)      Little MCP (0-90)  80 80 80  Little PIP (0-100)  90 decrease ext  90 90  Little DIP (0-70)      (Blank rows = not tested)  HAND FUNCTION: R grip10 lbs, L 60 lbs, lat pinch R 7, L 13 lbs, and 3 point pinch R 4 lbs and L 14 lbs  10/23/23 R Grip 17 lbs   COORDINATION: Decrease because of some stiffness still   SENSATION: Numbness reported in the fifth digit as well as decreased sensation in thumb through fourth.  Per patient numbness coming and going and pins-and-needles coming and going. 10/23/23 -pins-and-needles reported in all fingertips except thumb  EDEMA: Right wrist circumference 17.5 cm and the left 20 cm at eval  COGNITION: Overall cognitive status: Within functional limits for tasks assessed   OBSERVATIONS: Patient arrive with right arm in sling to eval TODAY'S TREATMENT:                                                                                                                              DATE: 10/23/23   Patient report most of discomfort or pain is in the wrist when trying to use it. Continue to have edema on the wrist more than 1-1/2 cm. Continues to have pins-and-needles in all fingertips. Stiffness with composite fist with a pull over dorsal metacarpals Grip increased since last visit 7 pounds doing yellow foam block.  Attempted light blue putty again but not able to do.  Fluidotherapy done doing contrast Pt seen Fluido with active range of motion for wrist and digits in all planes-with 2 rotations of ice done- to decrease edema, decrease pain, increase tissue mobility and range of motion.  10 mins total, performed prior to manual therapy.  Manual Therapy:   Metacarpal spreads done 15 reps as well as soft tissue massage to webspace.  Video recorddr for husband to assist patient after contrast at home to open carpal tunnel as well as webspace prior to active assisted range of motion for thumb palmar radial  abduction  Therapeutic Exercises:   Thumb palmar/radial abduction AAROM  opposition with focusing on making oval 12 reps each. Tendon glides  active range of motion 12 reps. With place and hold composite fist 12 resp Add and reviewed with patient passive range of motion for supination end range Followed by active range of motion for supination/pronation with focus on supination  CPM initiated for wrist extension 200 seconds followed by CPM for 200 seconds wrist flexion and extension Improved to 60 degrees for flexion and extension after second CPM AAROM wrist for flexion/extension, radial and ulnar deviation for 12 reps.  Over edge of table reviewed with patient again At table slides for wrist extension until pain-free 20 reps to 3 times a day.  Reviewed 1 pound weight for supination pronation, radial and ulnar deviation and flexion extension of the wrist  3 sets of 12 reps  2 times a day after passive range of motion stretches    Continue  light yellow foam block gripping 12 reps to 15 reps 2 times a day add to home program pain-free  Continue with home exercises and program 2-3 times a day to decrease edema and sensory changes continue with contrast,  Range of motion stretches prior to 1 pound weight  Fitted patient  with a Benik neoprene splint to use at home during the day to give some support but allow more motion. Patient was able to carry 4 pounds with no pain.  But lifting compensating with upper arm and shoulder.   PATIENT EDUCATION: Education details: findings of eval and HEP  Person educated: Patient Education method: Explanation, Demonstration, Tactile cues, Verbal cues, and Handouts Education comprehension: verbalized understanding, returned demonstration, verbal cues required, and needs further education   GOALS: Goals reviewed with patient? Yes  LONG TERM GOALS: Target date:  8 wks   Patient be independent in home program to decrease edema and pain less than the 3  to increase motion making composite fist. Baseline: Pain 3-11/10 wrist and hand.  No knowledge of home program.  Edema at the wrist increased by 2.5 cm in circumference.  MC flexion 60 to 80 degrees and PIP 80 to 95 degrees. Goal status: ONGOING  2.  Right wrist active range of motion increased in all planes to within functional limits for patient to use right dominant hand and more than 75% of bathing and dressing. Baseline: Patient 7-1/2 weeks postop.  Wrist brace most of the time.  Wrist extension 15 and flexion 50.  With ulnar deviation 18 and radial deviation 10 Goal status: ONGOING  3.  Right supination improved to 85 or more for patient to turn doorknob without increase symptoms. Baseline: Supination 65 degrees with pain on ulnar wrist not able to use hand functionally. Goal status: ONGOING  4.  Right wrist strength in all planes in range improved for patient to push and pull heavy door as well as turn doorknob return to driving and do laundry without increase symptoms Baseline: Patient 3-1/2 weeks postop and a wrist brace still.  No strength Goal status: ONGOING  5.  Right grip and prehension strength in right hand improved to more than 60% compared to the left for patient to carry more than 5 pounds without increase symptoms as well as pull up pants and socks, turn keep Baseline: Patient 3-1/2 weeks postop and wrist brace Goal status: ONGOING  ASSESSMENT:  CLINICAL IMPRESSION: Patient seen for occupational therapy for right dominant hand distal radius fracture with ORIF and a carpal tunnel release. Surgery on 08/28/2023.  Patient made good progress from start of care but continues to be limited mostly in wrist extension and flexion as well as  composite digits flexion-  Continues to have scar tissue at carpal tunnel as well as distal volar wrist scar limiting wrist extension.  Reviewed with patient again passive range of motion stretches and initiated 1 pound weight for wrist and  forearm in all planes this week- as well as grip strength improved this week 7 lbs.  Patient was fitted with a Benik neoprene splint to use during the day to allow more range of motion as well as composite flexion of the digits.  Was unable to do any putty.   Patient continues to benefit from skilled OT services to decrease edema and scar tissue as well as pain and increase motion and strength to return to prior level of function using right dominant hand.  PERFORMANCE DEFICITS: in functional skills including ADLs, IADLs, ROM, strength, pain, flexibility, decreased knowledge of use of DME, and UE functional use,   and psychosocial skills including environmental adaptation and routines and behaviors.   IMPAIRMENTS: are limiting patient from ADLs, IADLs, rest and sleep, play, leisure, and social participation.   COMORBIDITIES: has no other co-morbidities that affects occupational performance.  Patient will benefit from skilled OT to address above impairments and improve overall function.  MODIFICATION OR ASSISTANCE TO COMPLETE EVALUATION: No modification of tasks or assist necessary to complete an evaluation.  OT OCCUPATIONAL PROFILE AND HISTORY: Problem focused assessment: Including review of records relating to presenting problem.  CLINICAL DECISION MAKING: LOW - limited treatment options, no task modification necessary  REHAB POTENTIAL: Good for goals  EVALUATION COMPLEXITY: Low   PLAN:  OT FREQUENCY: 1-2x/week  OT DURATION: 8 weeks  PLANNED INTERVENTIONS: 97535 self care/ADL training, 02889 therapeutic exercise, 97140 manual therapy, 97018 paraffin, 02960 fluidotherapy, 97034 contrast bath, 97760 Orthotics management and training, scar mobilization, passive range of motion, patient/family education, and DME and/or AE instructions  CONSULTED AND AGREED WITH PLAN OF CARE: Patient  Ancel Peters, OTR/L,CLT 10/23/2023, 11:54 AM

## 2023-10-27 ENCOUNTER — Ambulatory Visit: Payer: Managed Care, Other (non HMO) | Admitting: Occupational Therapy

## 2023-10-27 DIAGNOSIS — M25641 Stiffness of right hand, not elsewhere classified: Secondary | ICD-10-CM

## 2023-10-27 DIAGNOSIS — R600 Localized edema: Secondary | ICD-10-CM

## 2023-10-27 DIAGNOSIS — M6281 Muscle weakness (generalized): Secondary | ICD-10-CM

## 2023-10-27 DIAGNOSIS — L905 Scar conditions and fibrosis of skin: Secondary | ICD-10-CM

## 2023-10-27 DIAGNOSIS — M25631 Stiffness of right wrist, not elsewhere classified: Secondary | ICD-10-CM

## 2023-10-27 NOTE — Therapy (Signed)
 OUTPATIENT OCCUPATIONAL THERAPY ORTHO TREATMENT NOTE   Patient Name: Heather Blake MRN: [id number] DOB:24-Sep-1961, 63 y.o., female  PCP: DR Glendia MART PROVIDER: Dr Kathlynn  END OF SESSION:  OT End of Session - 10/27/23 1507     Visit Number 11    Number of Visits 14    Date for OT Re-Evaluation 11/17/23    OT Start Time 1452    OT Stop Time 1535    OT Time Calculation (min) 43 min    Activity Tolerance Patient tolerated treatment well    Behavior During Therapy WFL for tasks assessed/performed              Past Medical History:  Diagnosis Date   Allergy    Anemia    Cancer (HCC)    Depression    Diverticulosis    FHx: migraine headaches    GERD (gastroesophageal reflux disease)    History of chickenpox    Hypercholesterolemia    Hyperlipidemia    Hypertension    Morbid obesity with BMI of 40.0-44.9, adult (HCC)    Motion sickness    ocean boats   OSA on CPAP    Osteoarthritis    Panic attacks    Sleep apnea    CPAP   Vertigo    none for over 10 yrs   Past Surgical History:  Procedure Laterality Date   ABDOMINAL HYSTERECTOMY     CATARACT EXTRACTION W/PHACO Left 08/11/2017   Procedure: CATARACT EXTRACTION PHACO AND INTRAOCULAR LENS PLACEMENT (IOC);  Surgeon: Jaye Fallow, MD;  Location: [facility];  Service: Ophthalmology;  Laterality: Left;  US  00:31 AP% 19.2 CDE 6.13 Fluid Pack lot # [id number]   CATARACT EXTRACTION W/PHACO Right 09/01/2017   Procedure: CATARACT EXTRACTION PHACO AND INTRAOCULAR LENS PLACEMENT (IOC);  Surgeon: Jaye Fallow, MD;  Location: [facility];  Service: Ophthalmology;  Laterality: Right;  US  00:30 AP% 11.7 CDE3.52 fluid pack lot #[id number]   Child Birth  1983 and 1979   COLONOSCOPY     COLONOSCOPY WITH PROPOFOL  N/A 08/25/2018   Procedure: COLONOSCOPY WITH PROPOFOL ;  Surgeon: Toledo, Ladell POUR, MD;  Location: [facility];  Service: Gastroenterology;  Laterality: N/A;   ESOPHAGOGASTRODUODENOSCOPY      ESOPHAGOGASTRODUODENOSCOPY (EGD) WITH PROPOFOL  N/A 08/25/2018   Procedure: ESOPHAGOGASTRODUODENOSCOPY (EGD) WITH PROPOFOL ;  Surgeon: Toledo, Ladell POUR, MD;  Location: [facility];  Service: Gastroenterology;  Laterality: N/A;   FRACTURE SURGERY     LAPAROSCOPIC SUPRACERVICAL HYSTERECTOMY  2006   ovaries left in place   left elbow     left knee     Miscarriage  1987   OPEN REDUCTION INTERNAL FIXATION (ORIF) DISTAL RADIAL FRACTURE Right 08/28/2023   Procedure: Open reduction & internal fixation of right distal radius and right carpal tunnel release;  Surgeon: Kathlynn Sharper, MD;  Location: [facility];  Service: Orthopedics;  Laterality: Right;   TONSILLECTOMY  1963   TUBAL LIGATION  1993   Patient Active Problem List   Diagnosis Date Noted   Near syncope 03/21/2023   Increased heart rate 02/01/2023   Bronchitis 12/31/2022   Sinus congestion 11/09/2022   Fall 09/14/2022   Cough 09/08/2022   Anemia 05/18/2022   Pre-op evaluation 04/17/2021   Ankle pain, right 12/12/2020   Hyperglycemia 10/09/2018   Sleep apnea 07/01/2018   Healthcare maintenance 09/19/2017   Chest pain 07/25/2017   Localized swelling, mass or lump of neck 06/21/2015   Right hip pain 01/09/2014   GERD (gastroesophageal reflux disease) 12/25/2013  Essential hypertension, benign 02/10/2013   Hypercholesterolemia 02/10/2013   Diverticulosis 02/10/2013   Anxiety 02/10/2013    ONSET DATE: 09/07/23  REFERRING DIAG: R wrist fx with ORIF and CTR  THERAPY DIAG:  Muscle weakness (generalized)  Localized edema  Scar condition and fibrosis of skin  Stiffness of right wrist, not elsewhere classified  Stiffness of right hand, not elsewhere classified  Rationale for Evaluation and Treatment: Rehabilitation  SUBJECTIVE:   SUBJECTIVE STATEMENT: I was little sore over the weekend in my wrist- but I think swelling is better, able to make fist better and grip - better today  Pt accompanied by:  significant other  PERTINENT HISTORY: 09/14/23 Ortho note : The patient is a 63 year old female who is 2 weeks status post ORIF of the right wrist and carpal tunnel release. Date of surgery was 08/28/2023. Refer to OT for sup /pron and fingers ROM   PRECAUTIONS: Wrist ORIF 08/28/23  WEIGHT BEARING RESTRICTIONS: No  PAIN:  Are you having pain?  2/10 in wrist   FALLS: Has patient fallen in last 6 months? Yes. Number of falls 1  LIVING ENVIRONMENT: Lives with: lives with their spouse  PLOF: Husband has terminal cancer.  Patient used to drive and do grocery shopping.  Patient does cooking and laundry and cleaning of the house  PATIENT GOALS: I want my pain and swelling better and get my motion and strength back so that I can drive and go to the grocery store to help my husband as needed  NEXT MD VISIT: 10/05/23  OBJECTIVE:  Note: Objective measures were completed at Evaluation unless otherwise noted.  HAND DOMINANCE: Right   UPPER EXTREMITY ROM:     Active ROM Right eval Right 10/06/23 R 10/16/23 R 10/20/23  Shoulder flexion      Shoulder abduction      Shoulder adduction      Shoulder extension      Shoulder internal rotation      Shoulder external rotation      Elbow flexion      Elbow extension      Wrist flexion 50 50 52 55  Wrist extension 15 28 30  40  Wrist ulnar deviation 18 28 20 25   Wrist radial deviation 10 15 20 20   Wrist pronation 90 90 90 90  Wrist supination 65 after contrast 75 75 80 80  (Blank rows = not tested)  Active ROM Right eval R 10/06/23 R 10/16/2023  Thumb MCP (0-60)     Thumb IP (0-80)     Thumb Radial abd/add (0-55) 48    Thumb Palmar abd/add (0-45) 60    Thumb Opposition to Small Finger Opposition to side of 4th     Index MCP (0-90) 75 75 80  Index PIP (0-100) 90 90 90  Index DIP (0-70)      Long MCP (0-90)  70 75 80  Long PIP (0-100)  80 90 95  Long DIP (0-70)      Ring MCP (0-90)  60 70 75  Ring PIP (0-100)  95 decrease ext  90 95   Ring DIP (0-70)      Little MCP (0-90)  80 80 80  Little PIP (0-100)  90 decrease ext  90 90  Little DIP (0-70)      (Blank rows = not tested)  HAND FUNCTION: R grip10 lbs, L 60 lbs, lat pinch R 7, L 13 lbs, and 3 point pinch R 4 lbs and L 14 lbs  10/23/23 R Grip 17 lbs  10/27/23 R grip19 lbs, L 60 lbs, lat pinch R 8, L 13 lbs, and 3 point pinch R 7 lbs and L 14 lbs   COORDINATION: Decrease because of some stiffness still   SENSATION: Numbness reported in the fifth digit as well as decreased sensation in thumb through fourth.  Per patient numbness coming and going and pins-and-needles coming and going. 10/23/23 -pins-and-needles reported in all fingertips except thumb  EDEMA: Right wrist circumference 17.5 cm and the left 20 cm at eval  COGNITION: Overall cognitive status: Within functional limits for tasks assessed   OBSERVATIONS: Patient arrive with right arm in sling to eval TODAY'S TREATMENT:                                                                                                                              DATE: 10/27/23   Increase soreness over weekend reported after doing CPM Friday -but better today - decrease edema in wrist , fisting AROM better and grip and prehension strength improve -see flowsheet UD and RD WNL today Sup 85  But increase stiffness in wrist flexion, ext      Fluidotherapy done doing contrast Pt seen Fluido with active range of motion for wrist and digits in all planes-with 1 rotation of ice done- to decrease edema, decrease pain, increase tissue mobility and range of motion.  10 mins total, performed prior to manual therapy.   Therapeutic Exercises:   Thumb palmar/radial abduction AAROM  opposition with focusing on making oval 12 reps each. Tendon glides  active range of motion 12 reps. AROM composite fist 12 resp cont with patient passive range of motion for supination end range Followed by active range of motion for  supination/pronation with focus on supination  CPM for wrist extension 2 x  200 seconds   AAROM wrist for flexion/extension, radial and ulnar deviation for 12 reps.  Over edge of table reviewed with patient again Followed by doing wrist ext on power wedge at home  And in clinic 1 kg ball on power wedge doing 15 reps supination  And RD, UD 15 reps  Cont 1 pound weight for supination pronation, radial and ulnar deviation and flexion extension of the wrist  3 sets of 12 reps  2 times a day after passive range of motion stretches    Upgrade to light blue putty for gripping today 12 reps  Pain free  Can do 2 sets of 12 reps  2 x day- reinforce not to over do -or do more   Cont  with a Benik neoprene splint to use at home during the day to give some support but allow more motion. Patient was able to carry 4 pounds with no pain.  But lifting compensating with upper arm and shoulder.   PATIENT EDUCATION: Education details: findings of eval and HEP  Person educated: Patient Education method: Explanation, Demonstration, Tactile cues, Verbal cues, and Handouts Education  comprehension: verbalized understanding, returned demonstration, verbal cues required, and needs further education   GOALS: Goals reviewed with patient? Yes  LONG TERM GOALS: Target date:  8 wks   Patient be independent in home program to decrease edema and pain less than the 3 to increase motion making composite fist. Baseline: Pain 3-11/10 wrist and hand.  No knowledge of home program.  Edema at the wrist increased by 2.5 cm in circumference.  MC flexion 60 to 80 degrees and PIP 80 to 95 degrees. Goal status: ONGOING  2.  Right wrist active range of motion increased in all planes to within functional limits for patient to use right dominant hand and more than 75% of bathing and dressing. Baseline: Patient 7-1/2 weeks postop.  Wrist brace most of the time.  Wrist extension 15 and flexion 50.  With ulnar deviation 18 and  radial deviation 10 Goal status: ONGOING  3.  Right supination improved to 85 or more for patient to turn doorknob without increase symptoms. Baseline: Supination 65 degrees with pain on ulnar wrist not able to use hand functionally. Goal status: ONGOING  4.  Right wrist strength in all planes in range improved for patient to push and pull heavy door as well as turn doorknob return to driving and do laundry without increase symptoms Baseline: Patient 3-1/2 weeks postop and a wrist brace still.  No strength Goal status: ONGOING  5.  Right grip and prehension strength in right hand improved to more than 60% compared to the left for patient to carry more than 5 pounds without increase symptoms as well as pull up pants and socks, turn keep Baseline: Patient 3-1/2 weeks postop and wrist brace Goal status: ONGOING  ASSESSMENT:  CLINICAL IMPRESSION: Patient seen for occupational therapy for right dominant hand distal radius fracture with ORIF and a carpal tunnel release. Surgery on 08/28/2023.  Patient made good progress from start of care but continues to be limited mostly in wrist extension and flexion as well as  composite digits flexion-  Continues to have scar tissue at carpal tunnel as well as distal volar wrist scar limiting wrist extension.  Reviewed with patient again passive range of motion stretches and cont 1 pound weight for wrist and forearm in all planes - grip and prehension increase - upgrade to light blue putty for gripping symptoms free- cont with Benik neoprene splint to use during the day to allow more range of motion as well as composite flexion of the digits.   Patient continues to benefit from skilled OT services to decrease edema and scar tissue as well as pain and increase motion and strength to return to prior level of function using right dominant hand.  PERFORMANCE DEFICITS: in functional skills including ADLs, IADLs, ROM, strength, pain, flexibility, decreased knowledge of  use of DME, and UE functional use,   and psychosocial skills including environmental adaptation and routines and behaviors.   IMPAIRMENTS: are limiting patient from ADLs, IADLs, rest and sleep, play, leisure, and social participation.   COMORBIDITIES: has no other co-morbidities that affects occupational performance. Patient will benefit from skilled OT to address above impairments and improve overall function.  MODIFICATION OR ASSISTANCE TO COMPLETE EVALUATION: No modification of tasks or assist necessary to complete an evaluation.  OT OCCUPATIONAL PROFILE AND HISTORY: Problem focused assessment: Including review of records relating to presenting problem.  CLINICAL DECISION MAKING: LOW - limited treatment options, no task modification necessary  REHAB POTENTIAL: Good for goals  EVALUATION COMPLEXITY: Low  PLAN:  OT FREQUENCY: 1-2x/week  OT DURATION: 8 weeks  PLANNED INTERVENTIONS: 97535 self care/ADL training, 02889 therapeutic exercise, 97140 manual therapy, 97018 paraffin, 02960 fluidotherapy, 97034 contrast bath, 97760 Orthotics management and training, scar mobilization, passive range of motion, patient/family education, and DME and/or AE instructions  CONSULTED AND AGREED WITH PLAN OF CARE: Patient  Ancel Peters, OTR/L,CLT 10/27/2023, 6:14 PM

## 2023-10-30 ENCOUNTER — Ambulatory Visit: Payer: Managed Care, Other (non HMO) | Admitting: Occupational Therapy

## 2023-10-30 DIAGNOSIS — M25641 Stiffness of right hand, not elsewhere classified: Secondary | ICD-10-CM

## 2023-10-30 DIAGNOSIS — M6281 Muscle weakness (generalized): Secondary | ICD-10-CM | POA: Diagnosis not present

## 2023-10-30 DIAGNOSIS — L905 Scar conditions and fibrosis of skin: Secondary | ICD-10-CM

## 2023-10-30 DIAGNOSIS — M25631 Stiffness of right wrist, not elsewhere classified: Secondary | ICD-10-CM

## 2023-10-30 NOTE — Therapy (Signed)
OUTPATIENT OCCUPATIONAL THERAPY ORTHO TREATMENT NOTE   Patient Name: Heather Blake MRN: [id number] DOB:12-13-1960, 63 y.o., female  PCP: DR Dara Lords PROVIDER: Dr Rosita Kea  END OF SESSION:  OT End of Session - 10/30/23 1408     Visit Number 12    Number of Visits 14    Date for OT Re-Evaluation 11/17/23    OT Start Time 1125    OT Stop Time 1212    OT Time Calculation (min) 47 min    Activity Tolerance Patient tolerated treatment well    Behavior During Therapy WFL for tasks assessed/performed              Past Medical History:  Diagnosis Date   Allergy    Anemia    Cancer (HCC)    Depression    Diverticulosis    FHx: migraine headaches    GERD (gastroesophageal reflux disease)    History of chickenpox    Hypercholesterolemia    Hyperlipidemia    Hypertension    Morbid obesity with BMI of 40.0-44.9, adult (HCC)    Motion sickness    ocean boats   OSA on CPAP    Osteoarthritis    Panic attacks    Sleep apnea    CPAP   Vertigo    none for over 10 yrs   Past Surgical History:  Procedure Laterality Date   ABDOMINAL HYSTERECTOMY     CATARACT EXTRACTION W/PHACO Left 08/11/2017   Procedure: CATARACT EXTRACTION PHACO AND INTRAOCULAR LENS PLACEMENT (IOC);  Surgeon: Galen Manila, MD;  Location: [facility];  Service: Ophthalmology;  Laterality: Left;  [place] 00:31 AP% 19.2 CDE 6.13 Fluid Pack lot # [id number]   CATARACT EXTRACTION W/PHACO Right 09/01/2017   Procedure: CATARACT EXTRACTION PHACO AND INTRAOCULAR LENS PLACEMENT (IOC);  Surgeon: Galen Manila, MD;  Location: [facility];  Service: Ophthalmology;  Laterality: Right;  [place] 00:30 AP% 11.7 CDE3.52 fluid pack lot #[id number]   Child Birth  1983 and 1979   COLONOSCOPY     COLONOSCOPY WITH PROPOFOL N/A 08/25/2018   Procedure: COLONOSCOPY WITH PROPOFOL;  Surgeon: Toledo, Boykin Nearing, MD;  Location: [facility];  Service: Gastroenterology;  Laterality: N/A;   ESOPHAGOGASTRODUODENOSCOPY      ESOPHAGOGASTRODUODENOSCOPY (EGD) WITH PROPOFOL N/A 08/25/2018   Procedure: ESOPHAGOGASTRODUODENOSCOPY (EGD) WITH PROPOFOL;  Surgeon: Toledo, Boykin Nearing, MD;  Location: [facility];  Service: Gastroenterology;  Laterality: N/A;   FRACTURE SURGERY     LAPAROSCOPIC SUPRACERVICAL HYSTERECTOMY  2006   ovaries left in place   left elbow     left knee     Miscarriage  1987   OPEN REDUCTION INTERNAL FIXATION (ORIF) DISTAL RADIAL FRACTURE Right 08/28/2023   Procedure: Open reduction & internal fixation of right distal radius and right carpal tunnel release;  Surgeon: Kennedy Bucker, MD;  Location: [facility];  Service: Orthopedics;  Laterality: Right;   TONSILLECTOMY  1963   TUBAL LIGATION  1993   Patient Active Problem List   Diagnosis Date Noted   Near syncope 03/21/2023   Increased heart rate 02/01/2023   Bronchitis 12/31/2022   Sinus congestion 11/09/2022   Fall 09/14/2022   Cough 09/08/2022   Anemia 05/18/2022   Pre-op evaluation 04/17/2021   Ankle pain, right 12/12/2020   Hyperglycemia 10/09/2018   Sleep apnea 07/01/2018   Healthcare maintenance 09/19/2017   Chest pain 07/25/2017   Localized swelling, mass or lump of neck 06/21/2015   Right hip pain 01/09/2014   GERD (gastroesophageal reflux disease) 12/25/2013  Essential hypertension, benign 02/10/2013   Hypercholesterolemia 02/10/2013   Diverticulosis 02/10/2013   Anxiety 02/10/2013    ONSET DATE: 09/07/23  REFERRING DIAG: R wrist fx with ORIF and CTR  THERAPY DIAG:  Muscle weakness (generalized)  Scar condition and fibrosis of skin  Stiffness of right wrist, not elsewhere classified  Stiffness of right hand, not elsewhere classified  Rationale for Evaluation and Treatment: Rehabilitation  SUBJECTIVE:   SUBJECTIVE STATEMENT: I am doing okay.  Husband not feeling too good at the moment could not help me with the massage.  I am doing the putty is not easy but manageable. Pt accompanied by: significant  other  PERTINENT HISTORY: 09/14/23 Ortho note : The patient is a 63 year old female who is 2 weeks status post ORIF of the right wrist and carpal tunnel release. Date of surgery was 08/28/2023. Refer to OT for sup /pron and fingers ROM   PRECAUTIONS: Wrist ORIF 08/28/23  WEIGHT BEARING RESTRICTIONS: No  PAIN:  Are you having pain?  Stiffness more than pain  FALLS: Has patient fallen in last 6 months? Yes. Number of falls 1  LIVING ENVIRONMENT: Lives with: lives with their spouse  PLOF: Husband has terminal cancer.  Patient used to drive and do grocery shopping.  Patient does cooking and laundry and cleaning of the house  PATIENT GOALS: I want my pain and swelling better and get my motion and strength back so that I can drive and go to the grocery store to help my husband as needed  NEXT MD VISIT: 10/05/23  OBJECTIVE:  Note: Objective measures were completed at Evaluation unless otherwise noted.  HAND DOMINANCE: Right   UPPER EXTREMITY ROM:     Active ROM Right eval Right 10/06/23 R 10/16/23 R 10/20/23  Shoulder flexion      Shoulder abduction      Shoulder adduction      Shoulder extension      Shoulder internal rotation      Shoulder external rotation      Elbow flexion      Elbow extension      Wrist flexion 50 50 52 55  Wrist extension 15 28 30  40  Wrist ulnar deviation 18 28 20 25   Wrist radial deviation 10 15 20 20   Wrist pronation 90 90 90 90  Wrist supination 65 after contrast 75 75 80 80  (Blank rows = not tested)  Active ROM Right eval R 10/06/23 R 10/16/2023  Thumb MCP (0-60)     Thumb IP (0-80)     Thumb Radial abd/add (0-55) 48    Thumb Palmar abd/add (0-45) 60    Thumb Opposition to Small Finger Opposition to side of 4th     Index MCP (0-90) 75 75 80  Index PIP (0-100) 90 90 90  Index DIP (0-70)      Long MCP (0-90)  70 75 80  Long PIP (0-100)  80 90 95  Long DIP (0-70)      Ring MCP (0-90)  60 70 75  Ring PIP (0-100)  95 decrease ext  90 95   Ring DIP (0-70)      Little MCP (0-90)  80 80 80  Little PIP (0-100)  90 decrease ext  90 90  Little DIP (0-70)      (Blank rows = not tested)  HAND FUNCTION: R grip10 lbs, L 60 lbs, lat pinch R 7, L 13 lbs, and 3 point pinch R 4 lbs and L 14 lbs  10/23/23 R Grip 17 lbs  10/27/23 R grip19 lbs, L 60 lbs, lat pinch R 8, L 13 lbs, and 3 point pinch R 7 lbs and L 14 lbs  10/30/23 Grip R 20 lbs  COORDINATION: Decrease because of some stiffness still   SENSATION: Numbness reported in the fifth digit as well as decreased sensation in thumb through fourth.  Per patient numbness coming and going and pins-and-needles coming and going. 10/23/23 -pins-and-needles reported in all fingertips except thumb  EDEMA: Right wrist circumference 17.5 cm and the left 20 cm at eval  COGNITION: Overall cognitive status: Within functional limits for tasks assessed   OBSERVATIONS: Patient arrive with right arm in sling to eval TODAY'S TREATMENT:                                                                                                                              DATE: 10/30/23   UD and RD WNL  Sup 85  Continue to be limited more wrist extension and flexion Grip increased to 20 pounds    Fluidotherapy done doing contrast Pt seen Fluido with active range of motion for wrist and digits in all planes-, decrease pain, increase tissue mobility and range of motion.  10 mins total, performed prior to manual therapy.  Manual therapy done some Graston tool #2 for sweeping and brushing over volar forearm as well as palm.  Staying around the carpal tunnel incision. Done 5-8 carpal spreads as well as soft tissue massage to thumb webspace and joint mobs to MCP joints prior to range of motion   Therapeutic Exercises:   Thumb palmar/radial abduction AAROM  Tendon glides  active range of motion 12 reps. AROM composite fist 12 resp cont with patient passive range of motion for supination end range Active  assisted range of motion and power wedge for wrist flexion extension 10 reps prior to CPM for wrist extension 2 x  200 seconds   AAROM wrist for flexion/extension, radial and ulnar deviation for 12 reps.  Over edge of table reviewed with patient again Patient able to carry 4 pounds with no pull just heaviness.  Patient to continue with 1 pound weight wrist flexion extension increased to 3 sets of 12  2 times a day  And upgrade patient to 2 pounds for  supination pronation, radial and ulnar deviation 1 set of 12 reps  2 times Can increase to a second set on Sunday if pain-free Add also some elbow flexion in neutral and supination for 2 pound weight 2 sets of 15 2 times a day  Continue light blue putty for gripping today 12 reps  Pain free  Can do 2 sets of 12 reps  2 x day- reinforce not to over do -or do more   Cont  with a Benik neoprene splint to use at home during the day to give some support but allow more motion. Patient was able to carry 4 pounds with no  pain.     PATIENT EDUCATION: Education details: findings of eval and HEP  Person educated: Patient Education method: Explanation, Demonstration, Tactile cues, Verbal cues, and Handouts Education comprehension: verbalized understanding, returned demonstration, verbal cues required, and needs further education   GOALS: Goals reviewed with patient? Yes  LONG TERM GOALS: Target date:  8 wks   Patient be independent in home program to decrease edema and pain less than the 3 to increase motion making composite fist. Baseline: Pain 3-11/10 wrist and hand.  No knowledge of home program.  Edema at the wrist increased by 2.5 cm in circumference.  MC flexion 60 to 80 degrees and PIP 80 to 95 degrees. Goal status: ONGOING  2.  Right wrist active range of motion increased in all planes to within functional limits for patient to use right dominant hand and more than 75% of bathing and dressing. Baseline: Patient 7-1/2 weeks postop.   Wrist brace most of the time.  Wrist extension 15 and flexion 50.  With ulnar deviation 18 and radial deviation 10 Goal status: ONGOING  3.  Right supination improved to 85 or more for patient to turn doorknob without increase symptoms. Baseline: Supination 65 degrees with pain on ulnar wrist not able to use hand functionally. Goal status: ONGOING  4.  Right wrist strength in all planes in range improved for patient to push and pull heavy door as well as turn doorknob return to driving and do laundry without increase symptoms Baseline: Patient 3-1/2 weeks postop and a wrist brace still.  No strength Goal status: ONGOING  5.  Right grip and prehension strength in right hand improved to more than 60% compared to the left for patient to carry more than 5 pounds without increase symptoms as well as pull up pants and socks, turn keep Baseline: Patient 3-1/2 weeks postop and wrist brace Goal status: ONGOING  ASSESSMENT:  CLINICAL IMPRESSION: Patient seen for occupational therapy for right dominant hand distal radius fracture with ORIF and a carpal tunnel release. Surgery on 08/28/2023.  Patient made good progress from start of care but continues to be limited mostly in wrist extension and flexion as well as  composite digits flexion-  Continues to have scar tissue at carpal tunnel as well as distal volar wrist scar limiting wrist extension.  Reviewed with patient again passive range of motion stretches and cont 1 pound weight for wrist flexion and extension-upgrade patient to 2 pounds for radial ulnar deviation and supination pronation of  forearm in all planes - grip and prehension increase -continue light blue putty for gripping symptoms free- cont with Benik neoprene splint to use during the day to allow more range of motion as well as composite flexion of the digits.  Add 2 pound weight for elbow flexion patient continues to benefit from skilled OT services to decrease edema and scar tissue as well  as pain and increase motion and strength to return to prior level of function using right dominant hand.  PERFORMANCE DEFICITS: in functional skills including ADLs, IADLs, ROM, strength, pain, flexibility, decreased knowledge of use of DME, and UE functional use,   and psychosocial skills including environmental adaptation and routines and behaviors.   IMPAIRMENTS: are limiting patient from ADLs, IADLs, rest and sleep, play, leisure, and social participation.   COMORBIDITIES: has no other co-morbidities that affects occupational performance. Patient will benefit from skilled OT to address above impairments and improve overall function.  MODIFICATION OR ASSISTANCE TO COMPLETE EVALUATION: No modification of tasks  or assist necessary to complete an evaluation.  OT OCCUPATIONAL PROFILE AND HISTORY: Problem focused assessment: Including review of records relating to presenting problem.  CLINICAL DECISION MAKING: LOW - limited treatment options, no task modification necessary  REHAB POTENTIAL: Good for goals  EVALUATION COMPLEXITY: Low   PLAN:  OT FREQUENCY: 1-2x/week  OT DURATION: 8 weeks  PLANNED INTERVENTIONS: 97535 self care/ADL training, 16109 therapeutic exercise, 97140 manual therapy, 97018 paraffin, 60454 fluidotherapy, 97034 contrast bath, 97760 Orthotics management and training, scar mobilization, passive range of motion, patient/family education, and DME and/or AE instructions  CONSULTED AND AGREED WITH PLAN OF CARE: Patient  Oletta Cohn, OTR/L,CLT 10/30/2023, 2:10 PM

## 2023-11-03 ENCOUNTER — Ambulatory Visit: Payer: Managed Care, Other (non HMO) | Admitting: Occupational Therapy

## 2023-11-03 DIAGNOSIS — M25631 Stiffness of right wrist, not elsewhere classified: Secondary | ICD-10-CM

## 2023-11-03 DIAGNOSIS — M6281 Muscle weakness (generalized): Secondary | ICD-10-CM | POA: Diagnosis not present

## 2023-11-03 DIAGNOSIS — M25641 Stiffness of right hand, not elsewhere classified: Secondary | ICD-10-CM

## 2023-11-03 DIAGNOSIS — R600 Localized edema: Secondary | ICD-10-CM

## 2023-11-03 DIAGNOSIS — L905 Scar conditions and fibrosis of skin: Secondary | ICD-10-CM

## 2023-11-03 NOTE — Therapy (Signed)
OUTPATIENT OCCUPATIONAL THERAPY ORTHO TREATMENT NOTE   Patient Name: Heather Blake MRN: [id number] DOB:[birth year], 63 y.o., female  PCP: DR Dara Lords PROVIDER: Dr Rosita Kea  END OF SESSION:  OT End of Session - 11/03/23 1507     Visit Number 13    Number of Visits 14    Date for OT Re-Evaluation 11/17/23    OT Start Time 1445    Activity Tolerance Patient tolerated treatment well    Behavior During Therapy [facility] for tasks assessed/performed              Past Medical History:  Diagnosis Date   Allergy    Anemia    Cancer (HCC)    Depression    Diverticulosis    FHx: migraine headaches    GERD (gastroesophageal reflux disease)    History of chickenpox    Hypercholesterolemia    Hyperlipidemia    Hypertension    Morbid obesity with BMI of 40.0-44.9, adult (HCC)    Motion sickness    ocean boats   OSA on CPAP    Osteoarthritis    Panic attacks    Sleep apnea    CPAP   Vertigo    none for over 10 yrs   Past Surgical History:  Procedure Laterality Date   ABDOMINAL HYSTERECTOMY     CATARACT EXTRACTION W/PHACO Left 08/11/2017   Procedure: CATARACT EXTRACTION PHACO AND INTRAOCULAR LENS PLACEMENT (IOC);  Surgeon: Galen Manila, MD;  Location: [facility];  Service: Ophthalmology;  Laterality: Left;  [place] 00:31 AP% 19.2 CDE 6.13 Fluid Pack lot # [id number]   CATARACT EXTRACTION W/PHACO Right 09/01/2017   Procedure: CATARACT EXTRACTION PHACO AND INTRAOCULAR LENS PLACEMENT (IOC);  Surgeon: Galen Manila, MD;  Location: [facility];  Service: Ophthalmology;  Laterality: Right;  [place] 00:30 AP% 11.7 CDE3.52 fluid pack lot #[id number]   Child Birth  1983 and 1979   COLONOSCOPY     COLONOSCOPY WITH PROPOFOL N/A 08/25/2018   Procedure: COLONOSCOPY WITH PROPOFOL;  Surgeon: Toledo, Boykin Nearing, MD;  Location: [facility];  Service: Gastroenterology;  Laterality: N/A;   ESOPHAGOGASTRODUODENOSCOPY     ESOPHAGOGASTRODUODENOSCOPY (EGD) WITH PROPOFOL N/A 08/25/2018   Procedure:  ESOPHAGOGASTRODUODENOSCOPY (EGD) WITH PROPOFOL;  Surgeon: Toledo, Boykin Nearing, MD;  Location: [facility];  Service: Gastroenterology;  Laterality: N/A;   FRACTURE SURGERY     LAPAROSCOPIC SUPRACERVICAL HYSTERECTOMY  2006   ovaries left in place   left elbow     left knee     Miscarriage  1987   OPEN REDUCTION INTERNAL FIXATION (ORIF) DISTAL RADIAL FRACTURE Right 08/28/2023   Procedure: Open reduction & internal fixation of right distal radius and right carpal tunnel release;  Surgeon: Kennedy Bucker, MD;  Location: [facility];  Service: Orthopedics;  Laterality: Right;   TONSILLECTOMY  1963   TUBAL LIGATION  1993   Patient Active Problem List   Diagnosis Date Noted   Near syncope 03/21/2023   Increased heart rate 02/01/2023   Bronchitis 12/31/2022   Sinus congestion 11/09/2022   Fall 09/14/2022   Cough 09/08/2022   Anemia 05/18/2022   Pre-op evaluation 04/17/2021   Ankle pain, right 12/12/2020   Hyperglycemia 10/09/2018   Sleep apnea 07/01/2018   Healthcare maintenance 09/19/2017   Chest pain 07/25/2017   Localized swelling, mass or lump of neck 06/21/2015   Right hip pain 01/09/2014   GERD (gastroesophageal reflux disease) 12/25/2013   Essential hypertension, benign 02/10/2013   Hypercholesterolemia 02/10/2013   Diverticulosis 02/10/2013   Anxiety 02/10/2013  ONSET DATE: 09/07/23  REFERRING DIAG: R wrist fx with ORIF and CTR  THERAPY DIAG:  Muscle weakness (generalized)  Scar condition and fibrosis of skin  Stiffness of right wrist, not elsewhere classified  Stiffness of right hand, not elsewhere classified  Localized edema  Rationale for Evaluation and Treatment: Rehabilitation  SUBJECTIVE:   SUBJECTIVE STATEMENT: LIttle more numb today in palm and fingers still pins and needles - I cannot crack egg or spray PAM I realize today- and hard to cut with knife Pt accompanied by: significant other  PERTINENT HISTORY: 09/14/23 Ortho note : The patient  is a 63 year old female who is 2 weeks status post ORIF of the right wrist and carpal tunnel release. Date of surgery was 08/28/2023. Refer to OT for sup /pron and fingers ROM   PRECAUTIONS: Wrist ORIF 08/28/23  WEIGHT BEARING RESTRICTIONS: No  PAIN:  Are you having pain?  Stiffness more than pain  FALLS: Has patient fallen in last 6 months? Yes. Number of falls 1  LIVING ENVIRONMENT: Lives with: lives with their spouse  PLOF: Husband has terminal cancer.  Patient used to drive and do grocery shopping.  Patient does cooking and laundry and cleaning of the house  PATIENT GOALS: I want my pain and swelling better and get my motion and strength back so that I can drive and go to the grocery store to help my husband as needed  NEXT MD VISIT: 11/16/23  OBJECTIVE:  Note: Objective measures were completed at Evaluation unless otherwise noted.  HAND DOMINANCE: Right   UPPER EXTREMITY ROM:     Active ROM Right eval Right 10/06/23 R 10/16/23 R 10/20/23 R 11/16/23  Shoulder flexion       Shoulder abduction       Shoulder adduction       Shoulder extension       Shoulder internal rotation       Shoulder external rotation       Elbow flexion       Elbow extension       Wrist flexion 50 50 52 55 55  Wrist extension 15 28 30  40 45  Wrist ulnar deviation 18 28 20 25 28   Wrist radial deviation 10 15 20 20 25   Wrist pronation 90 90 90 90 90  Wrist supination 65 after contrast 75 75 80 80 90  (Blank rows = not tested)  Active ROM Right eval R 10/06/23 R 10/16/2023  Thumb MCP (0-60)     Thumb IP (0-80)     Thumb Radial abd/add (0-55) 48    Thumb Palmar abd/add (0-45) 60    Thumb Opposition to Small Finger Opposition to side of 4th     Index MCP (0-90) 75 75 80  Index PIP (0-100) 90 90 90  Index DIP (0-70)      Long MCP (0-90)  70 75 80  Long PIP (0-100)  80 90 95  Long DIP (0-70)      Ring MCP (0-90)  60 70 75  Ring PIP (0-100)  95 decrease ext  90 95  Ring DIP (0-70)       Little MCP (0-90)  80 80 80  Little PIP (0-100)  90 decrease ext  90 90  Little DIP (0-70)      (Blank rows = not tested)  HAND FUNCTION: R grip10 lbs, L 60 lbs, lat pinch R 7, L 13 lbs, and 3 point pinch R 4 lbs and L 14 lbs   10/23/23  R Grip 17 lbs  10/27/23 R grip19 lbs, L 60 lbs, lat pinch R 8, L 13 lbs, and 3 point pinch R 7 lbs and L 14 lbs  10/30/23 Grip R 20 lbs  COORDINATION: Decrease because of some stiffness still   SENSATION: Numbness reported in the fifth digit as well as decreased sensation in thumb through fourth.  Per patient numbness coming and going and pins-and-needles coming and going. 10/23/23 -pins-and-needles reported in all fingertips except thumb  EDEMA: Right wrist circumference 17.5 cm and the left 19 cm at eval  COGNITION: Overall cognitive status: Within functional limits for tasks assessed   OBSERVATIONS: Patient arrive with right arm in sling to eval TODAY'S TREATMENT:                                                                                                                              DATE: 11/03/23   UD and RD WNL  Sup 90 Continue to be limited with wrist flexion and extension. Increased edema in the wrist and hand Report increase numbness in the palm this date    Fluidotherapy done doing contrast - 2 cycles of ice Pt seen Fluido with active range of motion for wrist and digits in all planes-, decrease pain, increase tissue mobility and range of motion.  10 mins total, performed prior to manual therapy and CPM.  Manual therapy done some Graston tool #2 for sweeping and brushing over volar forearm as well as palm.   Done 5-8 carpal spreads as well as soft tissue massage to thumb webspace and joint mobs to MCP joints prior to range of motion   Therapeutic Exercises:   Patient can continue withThumb palmar/radial abduction AAROM  Tendon glides  active range of motion 12 reps. AROM composite fist 12 reps At home Change home exercises  for patient to focus on wrist flexion extension with prolonged flexion stretch and extension stretch using heating pad 2 minutes each prior to 2 pound weight placed on hold for wrist extension 12 reps Continue with 2 sets of 12 for ulnar radial deviation and supination pronation 2 pounds no issues no increase symptoms Twice a day CPM for wrist extension 2 x  200 seconds done in clinic prior to 2 pound weight  AAROM wrist for flexion/extension, radial and ulnar deviation for 12 reps.  Over edge of table reviewed with patient again Patient able to carry 4 pounds with no pull just heaviness.   Continue elbow flexion in neutral and supination for 2 pound weight 2 sets of 15 2 times a day  Continue light blue putty for gripping today 12 reps  Pain free  Can do 2 sets of 12 reps  2 x day- reinforce not to over do -or do more   Cont  with a Benik neoprene splint to use at home during the day to give some support but allow more motion. Patient was able to carry 4 pounds with no  pain.     PATIENT EDUCATION: Education details: findings of eval and HEP  Person educated: Patient Education method: Explanation, Demonstration, Tactile cues, Verbal cues, and Handouts Education comprehension: verbalized understanding, returned demonstration, verbal cues required, and needs further education   GOALS: Goals reviewed with patient? Yes  LONG TERM GOALS: Target date:  8 wks   Patient be independent in home program to decrease edema and pain less than the 3 to increase motion making composite fist. Baseline: Pain 3-11/10 wrist and hand.  No knowledge of home program.  Edema at the wrist increased by 2.5 cm in circumference.  MC flexion 60 to 80 degrees and PIP 80 to 95 degrees. Goal status: ONGOING  2.  Right wrist active range of motion increased in all planes to within functional limits for patient to use right dominant hand and more than 75% of bathing and dressing. Baseline: Patient 7-1/2 weeks  postop.  Wrist brace most of the time.  Wrist extension 15 and flexion 50.  With ulnar deviation 18 and radial deviation 10 Goal status: ONGOING  3.  Right supination improved to 85 or more for patient to turn doorknob without increase symptoms. Baseline: Supination 65 degrees with pain on ulnar wrist not able to use hand functionally. Goal status: ONGOING  4.  Right wrist strength in all planes in range improved for patient to push and pull heavy door as well as turn doorknob return to driving and do laundry without increase symptoms Baseline: Patient 3-1/2 weeks postop and a wrist brace still.  No strength Goal status: ONGOING  5.  Right grip and prehension strength in right hand improved to more than 60% compared to the left for patient to carry more than 5 pounds without increase symptoms as well as pull up pants and socks, turn keep Baseline: Patient 3-1/2 weeks postop and wrist brace Goal status: ONGOING  ASSESSMENT:  CLINICAL IMPRESSION: Patient seen for occupational therapy for right dominant hand distal radius fracture with ORIF and a carpal tunnel release. Surgery on 08/28/2023.  Patient made good progress from start of care but continues to be limited mostly in edema in the wrist and hand limiting her wrist extension and flexion as well as  composite digits flexion-this date reports some numbness in the palm.  .  Reviewed with patient to do prolonged flexion and extension wrist stretches prior to 2 pounds for wrist extension placed on hold and continue with 2 pound weight for radial /ulnar deviation and supination/ pronation of  forearm. - grip and prehension increase -continue light blue putty for gripping symptoms free- cont with Benik neoprene splint to use during the day to allow more range of motion as well as composite flexion of the digits.  Add 2 pound weight for elbow flexion patient continues to benefit from skilled OT services to decrease edema and scar tissue as well as pain  and increase motion and strength to return to prior level of function using right dominant hand.  PERFORMANCE DEFICITS: in functional skills including ADLs, IADLs, ROM, strength, pain, flexibility, decreased knowledge of use of DME, and UE functional use,   and psychosocial skills including environmental adaptation and routines and behaviors.   IMPAIRMENTS: are limiting patient from ADLs, IADLs, rest and sleep, play, leisure, and social participation.   COMORBIDITIES: has no other co-morbidities that affects occupational performance. Patient will benefit from skilled OT to address above impairments and improve overall function.  MODIFICATION OR ASSISTANCE TO COMPLETE EVALUATION: No modification of tasks or  assist necessary to complete an evaluation.  OT OCCUPATIONAL PROFILE AND HISTORY: Problem focused assessment: Including review of records relating to presenting problem.  CLINICAL DECISION MAKING: LOW - limited treatment options, no task modification necessary  REHAB POTENTIAL: Good for goals  EVALUATION COMPLEXITY: Low   PLAN:  OT FREQUENCY: 1-2x/week  OT DURATION: 8 weeks  PLANNED INTERVENTIONS: 97535 self care/ADL training, 16109 therapeutic exercise, 97140 manual therapy, 97018 paraffin, 60454 fluidotherapy, 97034 contrast bath, 97760 Orthotics management and training, scar mobilization, passive range of motion, patient/family education, and DME and/or AE instructions  CONSULTED AND AGREED WITH PLAN OF CARE: Patient  Oletta Cohn, OTR/L,CLT 11/03/2023, 3:09 PM

## 2023-11-06 ENCOUNTER — Ambulatory Visit: Payer: Managed Care, Other (non HMO) | Admitting: Occupational Therapy

## 2023-11-06 DIAGNOSIS — M6281 Muscle weakness (generalized): Secondary | ICD-10-CM

## 2023-11-06 DIAGNOSIS — M25641 Stiffness of right hand, not elsewhere classified: Secondary | ICD-10-CM

## 2023-11-06 DIAGNOSIS — R600 Localized edema: Secondary | ICD-10-CM

## 2023-11-06 DIAGNOSIS — L905 Scar conditions and fibrosis of skin: Secondary | ICD-10-CM

## 2023-11-06 DIAGNOSIS — M25631 Stiffness of right wrist, not elsewhere classified: Secondary | ICD-10-CM

## 2023-11-06 NOTE — Therapy (Signed)
OUTPATIENT OCCUPATIONAL THERAPY ORTHO TREATMENT NOTE/RECERT   Patient Name: Heather Blake MRN: [id number] DOB:[birth year], 63 y.o., female  PCP: DR Dara Lords PROVIDER: Dr Rosita Kea  END OF SESSION:  OT End of Session - 11/06/23 1658     Visit Number 14    Number of Visits 16    Date for OT Re-Evaluation 11/13/23    OT Start Time 1123    OT Stop Time 1205    OT Time Calculation (min) 42 min    Activity Tolerance Patient tolerated treatment well    Behavior During Therapy WFL for tasks assessed/performed              Past Medical History:  Diagnosis Date   Allergy    Anemia    Cancer (HCC)    Depression    Diverticulosis    FHx: migraine headaches    GERD (gastroesophageal reflux disease)    History of chickenpox    Hypercholesterolemia    Hyperlipidemia    Hypertension    Morbid obesity with BMI of 40.0-44.9, adult (HCC)    Motion sickness    ocean boats   OSA on CPAP    Osteoarthritis    Panic attacks    Sleep apnea    CPAP   Vertigo    none for over 10 yrs   Past Surgical History:  Procedure Laterality Date   ABDOMINAL HYSTERECTOMY     CATARACT EXTRACTION W/PHACO Left 08/11/2017   Procedure: CATARACT EXTRACTION PHACO AND INTRAOCULAR LENS PLACEMENT (IOC);  Surgeon: Galen Manila, MD;  Location: [facility];  Service: Ophthalmology;  Laterality: Left;  [place] 00:31 AP% 19.2 CDE 6.13 Fluid Pack lot # [id number]   CATARACT EXTRACTION W/PHACO Right 09/01/2017   Procedure: CATARACT EXTRACTION PHACO AND INTRAOCULAR LENS PLACEMENT (IOC);  Surgeon: Galen Manila, MD;  Location: [facility];  Service: Ophthalmology;  Laterality: Right;  [place] 00:30 AP% 11.7 CDE3.52 fluid pack lot #[id number]   Child Birth  1983 and 1979   COLONOSCOPY     COLONOSCOPY WITH PROPOFOL N/A 08/25/2018   Procedure: COLONOSCOPY WITH PROPOFOL;  Surgeon: Toledo, Boykin Nearing, MD;  Location: [facility];  Service: Gastroenterology;  Laterality: N/A;   ESOPHAGOGASTRODUODENOSCOPY      ESOPHAGOGASTRODUODENOSCOPY (EGD) WITH PROPOFOL N/A 08/25/2018   Procedure: ESOPHAGOGASTRODUODENOSCOPY (EGD) WITH PROPOFOL;  Surgeon: Toledo, Boykin Nearing, MD;  Location: [facility];  Service: Gastroenterology;  Laterality: N/A;   FRACTURE SURGERY     LAPAROSCOPIC SUPRACERVICAL HYSTERECTOMY  2006   ovaries left in place   left elbow     left knee     Miscarriage  1987   OPEN REDUCTION INTERNAL FIXATION (ORIF) DISTAL RADIAL FRACTURE Right 08/28/2023   Procedure: Open reduction & internal fixation of right distal radius and right carpal tunnel release;  Surgeon: Kennedy Bucker, MD;  Location: [facility];  Service: Orthopedics;  Laterality: Right;   TONSILLECTOMY  1963   TUBAL LIGATION  1993   Patient Active Problem List   Diagnosis Date Noted   Near syncope 03/21/2023   Increased heart rate 02/01/2023   Bronchitis 12/31/2022   Sinus congestion 11/09/2022   Fall 09/14/2022   Cough 09/08/2022   Anemia 05/18/2022   Pre-op evaluation 04/17/2021   Ankle pain, right 12/12/2020   Hyperglycemia 10/09/2018   Sleep apnea 07/01/2018   Healthcare maintenance 09/19/2017   Chest pain 07/25/2017   Localized swelling, mass or lump of neck 06/21/2015   Right hip pain 01/09/2014   GERD (gastroesophageal reflux disease) 12/25/2013  Essential hypertension, benign 02/10/2013   Hypercholesterolemia 02/10/2013   Diverticulosis 02/10/2013   Anxiety 02/10/2013    ONSET DATE: 09/07/23  REFERRING DIAG: R wrist fx with ORIF and CTR  THERAPY DIAG:  Muscle weakness (generalized)  Scar condition and fibrosis of skin  Stiffness of right wrist, not elsewhere classified  Stiffness of right hand, not elsewhere classified  Localized edema  Rationale for Evaluation and Treatment: Rehabilitation  SUBJECTIVE:   SUBJECTIVE STATEMENT: I have been doing the 2 lbs weight - 1 lbs for the up and down - that motion is the hardest- I get it here with you in the session Pt accompanied by:  significant other  PERTINENT HISTORY: 09/14/23 Ortho note : The patient is a 63 year old female who is 2 weeks status post ORIF of the right wrist and carpal tunnel release. Date of surgery was 08/28/2023. Refer to OT for sup /pron and fingers ROM   PRECAUTIONS: Wrist ORIF 08/28/23  WEIGHT BEARING RESTRICTIONS: No  PAIN:  Are you having pain?  Stiffness more than pain  FALLS: Has patient fallen in last 6 months? Yes. Number of falls 1  LIVING ENVIRONMENT: Lives with: lives with their spouse  PLOF: Husband has terminal cancer.  Patient used to drive and do grocery shopping.  Patient does cooking and laundry and cleaning of the house  PATIENT GOALS: I want my pain and swelling better and get my motion and strength back so that I can drive and go to the grocery store to help my husband as needed  NEXT MD VISIT: 11/16/23  OBJECTIVE:  Note: Objective measures were completed at Evaluation unless otherwise noted.  HAND DOMINANCE: Right   UPPER EXTREMITY ROM:     Active ROM Right eval Right 10/06/23 R 10/16/23 R 10/20/23 R 11/06/23  Shoulder flexion       Shoulder abduction       Shoulder adduction       Shoulder extension       Shoulder internal rotation       Shoulder external rotation       Elbow flexion       Elbow extension       Wrist flexion 50 50 52 55 55 after stretches  70  Wrist extension 15 28 30  40 45 after stretches 50  Wrist ulnar deviation 18 28 20 25 28   Wrist radial deviation 10 15 20 20 25   Wrist pronation 90 90 90 90 90  Wrist supination 65 after contrast 75 75 80 80 90  (Blank rows = not tested)  Active ROM Right eval R 10/06/23 R 10/16/2023  Thumb MCP (0-60)     Thumb IP (0-80)     Thumb Radial abd/add (0-55) 48    Thumb Palmar abd/add (0-45) 60    Thumb Opposition to Small Finger Opposition to side of 4th     Index MCP (0-90) 75 75 80  Index PIP (0-100) 90 90 90  Index DIP (0-70)      Long MCP (0-90)  70 75 80  Long PIP (0-100)  80 90 95  Long  DIP (0-70)      Ring MCP (0-90)  60 70 75  Ring PIP (0-100)  95 decrease ext  90 95  Ring DIP (0-70)      Little MCP (0-90)  80 80 80  Little PIP (0-100)  90 decrease ext  90 90  Little DIP (0-70)      (Blank rows = not tested)  HAND  FUNCTION: R grip10 lbs, L 60 lbs, lat pinch R 7, L 13 lbs, and 3 point pinch R 4 lbs and L 14 lbs   10/23/23 R Grip 17 lbs  10/27/23 R grip19 lbs, L 60 lbs, lat pinch R 8, L 13 lbs, and 3 point pinch R 7 lbs and L 14 lbs  1/24 /25 Grip R 20 lbs  COORDINATION: Decrease because of some stiffness still   SENSATION: Numbness reported in the fifth digit as well as decreased sensation in thumb through fourth.  Per patient numbness coming and going and pins-and-needles coming and going. 10/23/23 -pins-and-needles reported in all fingertips except thumb  EDEMA: Right wrist circumference 17.5 cm and the left 19 cm at eval  COGNITION: Overall cognitive status: Within functional limits for tasks assessed   OBSERVATIONS: Patient arrive with right arm in sling to eval TODAY'S TREATMENT:                                                                                                                              DATE: 11/06/23   UD and RD WNL  Sup 90 Continue to be limited with wrist flexion and extension.  Cont increased edema in the wrist and hand Pins and needles in tips of fingers and some in palm    Done this date moist heat using weight and heat for wrist flexion stretch 1 min - x 2 with  Wrist extention stretch 1 min x 2 inbetween - pt ed on doing at home  Assess afterwards if pt can get same improvement using moist heat and stretches - than CPM   70 flexion and 50 extention Wrist  Followed by AAROM and AROM wrist flexion <> extention 15 reps ' 1 lbs for wrist ext and flexion -with place and hold wrist extention  2 x 12 reps  2 x day  Maintain progress in ROM in session - pt to do same at home Upgrade to 3 lbs ulnar /radial deviation and supination  /pronation  No increase symptoms  2 x 12 reps Elbow flexion 3 lbs palm up and thumb up  12 reps each  Rowing or scapula retraction - 3 lbs weight -15 reps Twice a day  Grip and prehension same that last time Upgrade pt to med teal putty for gripping 12 reps - can increase to 2nd set in 3 days if symptoms free  Add and review lat and 3 point pinch  Teal med putty- 2 x 12 reps  2 x day     PATIENT EDUCATION: Education details: progress  and HEP  Person educated: Patient Education method: Explanation, Demonstration, Tactile cues, Verbal cues, and Handouts Education comprehension: verbalized understanding, returned demonstration, verbal cues required, and needs further education   GOALS: Goals reviewed with patient? Yes  LONG TERM GOALS: Target date:  8 wks   Patient be independent in home program to decrease edema and pain less than the 3 to increase  motion making composite fist. Baseline: Pain 3-11/10 wrist and hand.  No knowledge of home program.  Edema at the wrist increased by 2.5 cm in circumference.  MC flexion 60 to 80 degrees and PIP 80 to 95 degrees. Progress and met goal but still increase edema at wrist  Goal status:met  2.  Right wrist active range of motion increased in all planes to within functional limits for patient to use right dominant hand and more than 75% of bathing and dressing. Baseline: Patient 7-1/2 weeks postop.  Wrist brace most of the time.  Wrist extension 15 and flexion 50.  With ulnar deviation 18 and radial deviation 10 Goal status: ONGOING  3.  Right supination improved to 85 or more for patient to turn doorknob without increase symptoms. Baseline: Supination 65 degrees with pain on ulnar wrist not able to use hand functionally. Goal status: MET  4.  Right wrist strength in all planes in range improved for patient to push and pull heavy door as well as turn doorknob return to driving and do laundry without increase symptoms Baseline: decrease  wrist flexion, extention- carry 4 lbs but with some discomfort  Goal status: ONGOING  5.  Right grip and prehension strength in right hand improved to more than 60% compared to the left for patient to carry more than 5 pounds without increase symptoms as well as pull up pants and socks, turn keep Baseline:  R grip 20lbs, L 60 lbs, lat pinch R 8, L 13 lbs, and 3 point pinch R 7 lbs and L 14 lbs  Goal status: ONGOING  ASSESSMENT:  CLINICAL IMPRESSION: Patient seen for occupational therapy for right dominant hand distal radius fracture with ORIF and a carpal tunnel release. Surgery on 08/28/2023.  Patient made good progress from start of care but continues to be limited mostly in edema in the wrist and hand limiting her wrist extension and flexion as well as  composite digits flexion-tpt cont to have some numbness in hand and tip of fingers.  Review again with patient to do prolonged flexion and extension wrist stretches using moist heat to increase flexion- extention prior to strengthening - Upgrade to 1-3 lbs for wrist , forearm and elbow - upgrade putty to med teal for grip and prehension strength.  Pt can cont to benefit from skilled OT services to decrease edema and increase motion ( and maintain progress in sessions) and strength to return to prior level of function using right dominant hand.  PERFORMANCE DEFICITS: in functional skills including ADLs, IADLs, ROM, strength, pain, flexibility, decreased knowledge of use of DME, and UE functional use,   and psychosocial skills including environmental adaptation and routines and behaviors.   IMPAIRMENTS: are limiting patient from ADLs, IADLs, rest and sleep, play, leisure, and social participation.   COMORBIDITIES: has no other co-morbidities that affects occupational performance. Patient will benefit from skilled OT to address above impairments and improve overall function.  MODIFICATION OR ASSISTANCE TO COMPLETE EVALUATION: No modification of  tasks or assist necessary to complete an evaluation.  OT OCCUPATIONAL PROFILE AND HISTORY: Problem focused assessment: Including review of records relating to presenting problem.  CLINICAL DECISION MAKING: LOW - limited treatment options, no task modification necessary  REHAB POTENTIAL: Good for goals  EVALUATION COMPLEXITY: Low   PLAN:  OT FREQUENCY: 2 x wk   OT DURATION: 1 wk  PLANNED INTERVENTIONS: 97535 self care/ADL training, 16109 therapeutic exercise, 97140 manual therapy, 97018 paraffin, 60454 fluidotherapy, 97034 contrast bath, 97760 Orthotics  management and training, scar mobilization, passive range of motion, patient/family education, and DME and/or AE instructions  CONSULTED AND AGREED WITH PLAN OF CARE: Patient  Oletta Cohn, OTR/L,CLT 11/06/2023, 5:00 PM

## 2023-11-10 ENCOUNTER — Ambulatory Visit: Payer: Managed Care, Other (non HMO) | Admitting: Occupational Therapy

## 2023-11-10 ENCOUNTER — Telehealth: Payer: Self-pay | Admitting: Internal Medicine

## 2023-11-10 DIAGNOSIS — M25631 Stiffness of right wrist, not elsewhere classified: Secondary | ICD-10-CM

## 2023-11-10 DIAGNOSIS — I10 Essential (primary) hypertension: Secondary | ICD-10-CM

## 2023-11-10 DIAGNOSIS — M25641 Stiffness of right hand, not elsewhere classified: Secondary | ICD-10-CM

## 2023-11-10 DIAGNOSIS — E7800 Pure hypercholesterolemia, unspecified: Secondary | ICD-10-CM

## 2023-11-10 DIAGNOSIS — M6281 Muscle weakness (generalized): Secondary | ICD-10-CM

## 2023-11-10 DIAGNOSIS — L905 Scar conditions and fibrosis of skin: Secondary | ICD-10-CM

## 2023-11-10 DIAGNOSIS — R739 Hyperglycemia, unspecified: Secondary | ICD-10-CM

## 2023-11-10 DIAGNOSIS — R600 Localized edema: Secondary | ICD-10-CM

## 2023-11-10 NOTE — Telephone Encounter (Signed)
Patient need lab orders.

## 2023-11-10 NOTE — Therapy (Unsigned)
OUTPATIENT OCCUPATIONAL THERAPY ORTHO TREATMENT NOTE   Patient Name: Heather Blake MRN: [id number] DOB:[birth year], 63 y.o., female  PCP: DR Dara Lords PROVIDER: Dr Rosita Kea  END OF SESSION:  OT End of Session - 11/10/23 1446     Visit Number 15    Date for OT Re-Evaluation 11/13/23    OT Start Time 1446    Activity Tolerance Patient tolerated treatment well    Behavior During Therapy [facility] for tasks assessed/performed              Past Medical History:  Diagnosis Date   Allergy    Anemia    Cancer (HCC)    Depression    Diverticulosis    FHx: migraine headaches    GERD (gastroesophageal reflux disease)    History of chickenpox    Hypercholesterolemia    Hyperlipidemia    Hypertension    Morbid obesity with BMI of 40.0-44.9, adult (HCC)    Motion sickness    ocean boats   OSA on CPAP    Osteoarthritis    Panic attacks    Sleep apnea    CPAP   Vertigo    none for over 10 yrs   Past Surgical History:  Procedure Laterality Date   ABDOMINAL HYSTERECTOMY     CATARACT EXTRACTION W/PHACO Left 08/11/2017   Procedure: CATARACT EXTRACTION PHACO AND INTRAOCULAR LENS PLACEMENT (IOC);  Surgeon: Galen Manila, MD;  Location: [facility];  Service: Ophthalmology;  Laterality: Left;  [place] 00:31 AP% 19.2 CDE 6.13 Fluid Pack lot # [id number]   CATARACT EXTRACTION W/PHACO Right 09/01/2017   Procedure: CATARACT EXTRACTION PHACO AND INTRAOCULAR LENS PLACEMENT (IOC);  Surgeon: Galen Manila, MD;  Location: [facility];  Service: Ophthalmology;  Laterality: Right;  [place] 00:30 AP% 11.7 CDE3.52 fluid pack lot #[id number]   Child Birth  1983 and 1979   COLONOSCOPY     COLONOSCOPY WITH PROPOFOL N/A 08/25/2018   Procedure: COLONOSCOPY WITH PROPOFOL;  Surgeon: Toledo, Boykin Nearing, MD;  Location: [facility];  Service: Gastroenterology;  Laterality: N/A;   ESOPHAGOGASTRODUODENOSCOPY     ESOPHAGOGASTRODUODENOSCOPY (EGD) WITH PROPOFOL N/A 08/25/2018   Procedure:  ESOPHAGOGASTRODUODENOSCOPY (EGD) WITH PROPOFOL;  Surgeon: Toledo, Boykin Nearing, MD;  Location: [facility];  Service: Gastroenterology;  Laterality: N/A;   FRACTURE SURGERY     LAPAROSCOPIC SUPRACERVICAL HYSTERECTOMY  2006   ovaries left in place   left elbow     left knee     Miscarriage  1987   OPEN REDUCTION INTERNAL FIXATION (ORIF) DISTAL RADIAL FRACTURE Right 08/28/2023   Procedure: Open reduction & internal fixation of right distal radius and right carpal tunnel release;  Surgeon: Kennedy Bucker, MD;  Location: [facility];  Service: Orthopedics;  Laterality: Right;   TONSILLECTOMY  1963   TUBAL LIGATION  1993   Patient Active Problem List   Diagnosis Date Noted   Near syncope 03/21/2023   Increased heart rate 02/01/2023   Bronchitis 12/31/2022   Sinus congestion 11/09/2022   Fall 09/14/2022   Cough 09/08/2022   Anemia 05/18/2022   Pre-op evaluation 04/17/2021   Ankle pain, right 12/12/2020   Hyperglycemia 10/09/2018   Sleep apnea 07/01/2018   Healthcare maintenance 09/19/2017   Chest pain 07/25/2017   Localized swelling, mass or lump of neck 06/21/2015   Right hip pain 01/09/2014   GERD (gastroesophageal reflux disease) 12/25/2013   Essential hypertension, benign 02/10/2013   Hypercholesterolemia 02/10/2013   Diverticulosis 02/10/2013   Anxiety 02/10/2013    ONSET DATE: 09/07/23  REFERRING DIAG: R wrist fx with ORIF and CTR  THERAPY DIAG:  Muscle weakness (generalized)  Scar condition and fibrosis of skin  Stiffness of right wrist, not elsewhere classified  Stiffness of right hand, not elsewhere classified  Localized edema  Rationale for Evaluation and Treatment: Rehabilitation  SUBJECTIVE:   SUBJECTIVE STATEMENT: Done exercises  using the heat -done the 3 lbs - starting car easier- can do with R hand- still get tired and some ache to cut veggies - lifting pots, carrying basket for laundry still hard-  Pertinent HISTORY: 09/14/23 Ortho note : The  patient is a 63 year old female who is 2 weeks status post ORIF of the right wrist and carpal tunnel release. Date of surgery was 08/28/2023. Refer to OT for sup /pron and fingers ROM   PRECAUTIONS: Wrist ORIF 08/28/23  WEIGHT BEARING RESTRICTIONS: No  PAIN:  Are you having pain?  Stiffness more than pain  FALLS: Has patient fallen in last 6 months? Yes. Number of falls 1  LIVING ENVIRONMENT: Lives with: lives with their spouse  PLOF: Husband has terminal cancer.  Patient used to drive and do grocery shopping.  Patient does cooking and laundry and cleaning of the house  PATIENT GOALS: I want my pain and swelling better and get my motion and strength back so that I can drive and go to the grocery store to help my husband as needed  NEXT MD VISIT: 11/16/23  OBJECTIVE:  Note: Objective measures were completed at Evaluation unless otherwise noted.  HAND DOMINANCE: Right   UPPER EXTREMITY ROM:     Active ROM Right eval Right 10/06/23 R 10/16/23 R 10/20/23 R 11/06/23  Shoulder flexion       Shoulder abduction       Shoulder adduction       Shoulder extension       Shoulder internal rotation       Shoulder external rotation       Elbow flexion       Elbow extension       Wrist flexion 50 50 52 55 55 after stretches  70  Wrist extension 15 28 30  40 45 after stretches 50  Wrist ulnar deviation 18 28 20 25 28   Wrist radial deviation 10 15 20 20 25   Wrist pronation 90 90 90 90 90  Wrist supination 65 after contrast 75 75 80 80 90  (Blank rows = not tested)  Active ROM Right eval R 10/06/23 R 10/16/2023  Thumb MCP (0-60)     Thumb IP (0-80)     Thumb Radial abd/add (0-55) 48    Thumb Palmar abd/add (0-45) 60    Thumb Opposition to Small Finger Opposition to side of 4th     Index MCP (0-90) 75 75 80  Index PIP (0-100) 90 90 90  Index DIP (0-70)      Long MCP (0-90)  70 75 80  Long PIP (0-100)  80 90 95  Long DIP (0-70)      Ring MCP (0-90)  60 70 75  Ring PIP (0-100)  95  decrease ext  90 95  Ring DIP (0-70)      Little MCP (0-90)  80 80 80  Little PIP (0-100)  90 decrease ext  90 90  Little DIP (0-70)      (Blank rows = not tested)  HAND FUNCTION: R grip10 lbs, L 60 lbs, lat pinch R 7, L 13 lbs, and 3 point pinch R 4 lbs and  L 14 lbs   10/23/23 R Grip 17 lbs  10/27/23 R grip19 lbs, L 60 lbs, lat pinch R 8, L 13 lbs, and 3 point pinch R 7 lbs and L 14 lbs  1/24 /25 Grip R 20 lbs  COORDINATION: Decrease because of some stiffness still   SENSATION: Numbness reported in the fifth digit as well as decreased sensation in thumb through fourth.  Per patient numbness coming and going and pins-and-needles coming and going. 10/23/23 -pins-and-needles reported in all fingertips except thumb  EDEMA: Right wrist circumference 17.5 cm and the left 19 cm at eval  COGNITION: Overall cognitive status: Within functional limits for tasks assessed   OBSERVATIONS: Patient arrive with right arm in sling to eval TODAY'S TREATMENT:                                                                                                                              DATE: 11/10/23   UD and RD WNL  Sup 90 Continue to be limited with wrist flexion and extension.  Cont increased edema in the wrist and hand Pins and needles in tips of fingers and some in palm    Done this date moist heat using weight and heat for wrist flexion stretch 1 min - x 2 with  Wrist extention stretch 1 min x 2 inbetween - pt ed on doing at home  Assess afterwards if pt can get same improvement using moist heat and stretches - than CPM   70 flexion and 50 extention Wrist  Followed by AAROM and AROM wrist flexion <> extention 15 reps ' 1 lbs for wrist ext and flexion -with place and hold wrist extention  2 x 12 reps  2 x day  Maintain progress in ROM in session - pt to do same at home Upgrade to 3 lbs ulnar /radial deviation and supination /pronation  No increase symptoms  2 x 12 reps Elbow flexion 3  lbs palm up and thumb up  12 reps each  Rowing or scapula retraction - 3 lbs weight -15 reps Twice a day  Grip and prehension same that last time Upgrade pt to med teal putty for gripping 12 reps - can increase to 2nd set in 3 days if symptoms free  Add and review lat and 3 point pinch  Teal med putty- 2 x 12 reps  2 x day     PATIENT EDUCATION: Education details: progress  and HEP  Person educated: Patient Education method: Explanation, Demonstration, Tactile cues, Verbal cues, and Handouts Education comprehension: verbalized understanding, returned demonstration, verbal cues required, and needs further education   GOALS: Goals reviewed with patient? Yes  LONG TERM GOALS: Target date:  8 wks   Patient be independent in home program to decrease edema and pain less than the 3 to increase motion making composite fist. Baseline: Pain 3-11/10 wrist and hand.  No knowledge of home program.  Edema at the wrist increased  by 2.5 cm in circumference.  MC flexion 60 to 80 degrees and PIP 80 to 95 degrees. Progress and met goal but still increase edema at wrist  Goal status:met  2.  Right wrist active range of motion increased in all planes to within functional limits for patient to use right dominant hand and more than 75% of bathing and dressing. Baseline: Patient 7-1/2 weeks postop.  Wrist brace most of the time.  Wrist extension 15 and flexion 50.  With ulnar deviation 18 and radial deviation 10 Goal status: ONGOING  3.  Right supination improved to 85 or more for patient to turn doorknob without increase symptoms. Baseline: Supination 65 degrees with pain on ulnar wrist not able to use hand functionally. Goal status: MET  4.  Right wrist strength in all planes in range improved for patient to push and pull heavy door as well as turn doorknob return to driving and do laundry without increase symptoms Baseline: decrease wrist flexion, extention- carry 4 lbs but with some discomfort   Goal status: ONGOING  5.  Right grip and prehension strength in right hand improved to more than 60% compared to the left for patient to carry more than 5 pounds without increase symptoms as well as pull up pants and socks, turn keep Baseline:  R grip 20lbs, L 60 lbs, lat pinch R 8, L 13 lbs, and 3 point pinch R 7 lbs and L 14 lbs  Goal status: ONGOING  ASSESSMENT:  CLINICAL IMPRESSION: Patient seen for occupational therapy for right dominant hand distal radius fracture with ORIF and a carpal tunnel release. Surgery on 08/28/2023.  Patient made good progress from start of care but continues to be limited mostly in edema in the wrist and hand limiting her wrist extension and flexion as well as  composite digits flexion-tpt cont to have some numbness in hand and tip of fingers.  Review again with patient to do prolonged flexion and extension wrist stretches using moist heat to increase flexion- extention prior to strengthening - Upgrade to 1-3 lbs for wrist , forearm and elbow - upgrade putty to med teal for grip and prehension strength.  Pt can cont to benefit from skilled OT services to decrease edema and increase motion ( and maintain progress in sessions) and strength to return to prior level of function using right dominant hand.  PERFORMANCE DEFICITS: in functional skills including ADLs, IADLs, ROM, strength, pain, flexibility, decreased knowledge of use of DME, and UE functional use,   and psychosocial skills including environmental adaptation and routines and behaviors.   IMPAIRMENTS: are limiting patient from ADLs, IADLs, rest and sleep, play, leisure, and social participation.   COMORBIDITIES: has no other co-morbidities that affects occupational performance. Patient will benefit from skilled OT to address above impairments and improve overall function.  MODIFICATION OR ASSISTANCE TO COMPLETE EVALUATION: No modification of tasks or assist necessary to complete an evaluation.  OT  OCCUPATIONAL PROFILE AND HISTORY: Problem focused assessment: Including review of records relating to presenting problem.  CLINICAL DECISION MAKING: LOW - limited treatment options, no task modification necessary  REHAB POTENTIAL: Good for goals  EVALUATION COMPLEXITY: Low   PLAN:  OT FREQUENCY: 2 x wk   OT DURATION: 1 wk  PLANNED INTERVENTIONS: 97535 self care/ADL training, 09811 therapeutic exercise, 97140 manual therapy, 97018 paraffin, 91478 fluidotherapy, 97034 contrast bath, 97760 Orthotics management and training, scar mobilization, passive range of motion, patient/family education, and DME and/or AE instructions  CONSULTED AND AGREED WITH PLAN  OF CARE: Patient  Oletta Cohn, OTR/L,CLT 11/10/2023, 2:47 PM

## 2023-11-11 NOTE — Telephone Encounter (Signed)
Orders placed for labs

## 2023-11-12 ENCOUNTER — Ambulatory Visit: Payer: Managed Care, Other (non HMO) | Admitting: Occupational Therapy

## 2023-11-12 DIAGNOSIS — M25631 Stiffness of right wrist, not elsewhere classified: Secondary | ICD-10-CM

## 2023-11-12 DIAGNOSIS — M6281 Muscle weakness (generalized): Secondary | ICD-10-CM

## 2023-11-12 DIAGNOSIS — L905 Scar conditions and fibrosis of skin: Secondary | ICD-10-CM

## 2023-11-12 NOTE — Therapy (Signed)
OUTPATIENT OCCUPATIONAL THERAPY ORTHO TREATMENT NOTE   Patient Name: Heather Blake MRN: [id number] DOB:07/06/1961, 63 y.o., female  PCP: DR Dara Lords PROVIDER: Dr Rosita Kea  END OF SESSION:  OT End of Session - 11/12/23 1642     Visit Number 16    Number of Visits 16    Date for OT Re-Evaluation 11/13/23    OT Start Time 1447    OT Stop Time 1528    OT Time Calculation (min) 41 min    Activity Tolerance Patient tolerated treatment well    Behavior During Therapy WFL for tasks assessed/performed              Past Medical History:  Diagnosis Date   Allergy    Anemia    Cancer (HCC)    Depression    Diverticulosis    FHx: migraine headaches    GERD (gastroesophageal reflux disease)    History of chickenpox    Hypercholesterolemia    Hyperlipidemia    Hypertension    Morbid obesity with BMI of 40.0-44.9, adult (HCC)    Motion sickness    ocean boats   OSA on CPAP    Osteoarthritis    Panic attacks    Sleep apnea    CPAP   Vertigo    none for over 10 yrs   Past Surgical History:  Procedure Laterality Date   ABDOMINAL HYSTERECTOMY     CATARACT EXTRACTION W/PHACO Left 08/11/2017   Procedure: CATARACT EXTRACTION PHACO AND INTRAOCULAR LENS PLACEMENT (IOC);  Surgeon: Galen Manila, MD;  Location: [facility];  Service: Ophthalmology;  Laterality: Left;  [place] 00:31 AP% 19.2 CDE 6.13 Fluid Pack lot # [id number]   CATARACT EXTRACTION W/PHACO Right 09/01/2017   Procedure: CATARACT EXTRACTION PHACO AND INTRAOCULAR LENS PLACEMENT (IOC);  Surgeon: Galen Manila, MD;  Location: [facility];  Service: Ophthalmology;  Laterality: Right;  [place] 00:30 AP% 11.7 CDE3.52 fluid pack lot #[id number]   Child Birth  1981 and 1979   COLONOSCOPY     COLONOSCOPY WITH PROPOFOL N/A 08/25/2018   Procedure: COLONOSCOPY WITH PROPOFOL;  Surgeon: Toledo, Boykin Nearing, MD;  Location: [facility];  Service: Gastroenterology;  Laterality: N/A;   ESOPHAGOGASTRODUODENOSCOPY      ESOPHAGOGASTRODUODENOSCOPY (EGD) WITH PROPOFOL N/A 08/25/2018   Procedure: ESOPHAGOGASTRODUODENOSCOPY (EGD) WITH PROPOFOL;  Surgeon: Toledo, Boykin Nearing, MD;  Location: [facility];  Service: Gastroenterology;  Laterality: N/A;   FRACTURE SURGERY     LAPAROSCOPIC SUPRACERVICAL HYSTERECTOMY  2006   ovaries left in place   left elbow     left knee     Miscarriage  1987   OPEN REDUCTION INTERNAL FIXATION (ORIF) DISTAL RADIAL FRACTURE Right 08/28/2023   Procedure: Open reduction & internal fixation of right distal radius and right carpal tunnel release;  Surgeon: Kennedy Bucker, MD;  Location: [facility];  Service: Orthopedics;  Laterality: Right;   TONSILLECTOMY  1963   TUBAL LIGATION  1993   Patient Active Problem List   Diagnosis Date Noted   Near syncope 03/21/2023   Increased heart rate 02/01/2023   Bronchitis 12/31/2022   Sinus congestion 11/09/2022   Fall 09/14/2022   Cough 09/08/2022   Anemia 05/18/2022   Pre-op evaluation 04/17/2021   Ankle pain, right 12/12/2020   Hyperglycemia 10/09/2018   Sleep apnea 07/01/2018   Healthcare maintenance 09/19/2017   Chest pain 07/25/2017   Localized swelling, mass or lump of neck 06/21/2015   Right hip pain 01/09/2014   GERD (gastroesophageal reflux disease) 12/25/2013  Essential hypertension, benign 02/10/2013   Hypercholesterolemia 02/10/2013   Diverticulosis 02/10/2013   Anxiety 02/10/2013    ONSET DATE: 09/07/23  REFERRING DIAG: R wrist fx with ORIF and CTR  THERAPY DIAG:  Muscle weakness (generalized)  Scar condition and fibrosis of skin  Stiffness of right wrist, not elsewhere classified  Rationale for Evaluation and Treatment: Rehabilitation  SUBJECTIVE:   SUBJECTIVE STATEMENT: Done exercises  using the heat -starting car easier- can do  it with R hand- now- still get tired and some pain to cut veggies - lifting pots, carrying basket for laundry still hard- do not like the new putty - but cont with swelling  , and numbness in palm at times  Pertinent HISTORY: 09/14/23 Ortho note : The patient is a 63 year old female who is 2 weeks status post ORIF of the right wrist and carpal tunnel release. Date of surgery was 08/28/2023. Refer to OT for sup /pron and fingers ROM   PRECAUTIONS: Wrist ORIF 08/28/23  WEIGHT BEARING RESTRICTIONS: No  PAIN:  Are you having pain?  Stiffness more than pain  FALLS: Has patient fallen in last 6 months? Yes. Number of falls 1  LIVING ENVIRONMENT: Lives with: lives with their spouse  PLOF: Husband has terminal cancer.  Patient used to drive and do grocery shopping.  Patient does cooking and laundry and cleaning of the house  PATIENT GOALS: I want my pain and swelling better and get my motion and strength back so that I can drive and go to the grocery store to help my husband as needed  NEXT MD VISIT: 11/16/23  OBJECTIVE:  Note: Objective measures were completed at Evaluation unless otherwise noted.  HAND DOMINANCE: Right   UPPER EXTREMITY ROM:     Active ROM Right eval Right 10/06/23 R 10/16/23 R 10/20/23 R 11/06/23 R 11/12/23  Shoulder flexion        Shoulder abduction        Shoulder adduction        Shoulder extension        Shoulder internal rotation        Shoulder external rotation        Elbow flexion        Elbow extension        Wrist flexion 50 50 52 55 55 after stretches  70 70 after heat - 55 coming in  Wrist extension 15 28 30  40 45 after stretches 50 40 and 50 after heat  Wrist ulnar deviation 18 28 20 25 28 25   Wrist radial deviation 10 15 20 20 25 25   Wrist pronation 90 90 90 90 90 90  Wrist supination 65 after contrast 75 75 80 80 90 90  (Blank rows = not tested)  Active ROM Right eval R 10/06/23 R 10/16/2023  Thumb MCP (0-60)     Thumb IP (0-80)     Thumb Radial abd/add (0-55) 48    Thumb Palmar abd/add (0-45) 60    Thumb Opposition to Small Finger Opposition to side of 4th     Index MCP (0-90) 75 75 80  Index PIP (0-100) 90  90 90  Index DIP (0-70)      Long MCP (0-90)  70 75 80  Long PIP (0-100)  80 90 95  Long DIP (0-70)      Ring MCP (0-90)  60 70 75  Ring PIP (0-100)  95 decrease ext  90 95  Ring DIP (0-70)      Little MCP (  0-90)  80 80 80  Little PIP (0-100)  90 decrease ext  90 90  Little DIP (0-70)      (Blank rows = not tested)  HAND FUNCTION: R grip10 lbs, L 60 lbs, lat pinch R 7, L 13 lbs, and 3 point pinch R 4 lbs and L 14 lbs   10/23/23 R Grip 17 lbs  10/27/23 R grip19 lbs, L 60 lbs, lat pinch R 8, L 13 lbs, and 3 point pinch R 7 lbs and L 14 lbs  1/24 /25 Grip R 20 lbs 11/12/23 R grip 23 lbs, L 60 lbs, lat pinch R 9, L 13 lbs, and 3 point pinch R 9 lbs and L 14 lbs  COORDINATION: Decrease because of some stiffness still   SENSATION: Numbness reported in the fifth digit as well as decreased sensation in thumb through fourth.  Per patient numbness coming and going and pins-and-needles coming and going. 10/23/23 -pins-and-needles reported in all fingertips except thumb  EDEMA: Right wrist circumference 17.5 cm and the left 19 cm at eval  COGNITION: Overall cognitive status: Within functional limits for tasks assessed   OBSERVATIONS: Patient arrive with right arm in sling to eval TODAY'S TREATMENT:                                                                                                                              DATE: 11/12/23   UD and RD WNL  Sup 90 Show this date increase grip and prehension - with use of new putty since last time  Continue to be limited with wrist flexion and extension.  Cont increased edema in the wrist and hand Pins and needles in tips of fingers and sometimes numbness  in palm Strength in available AROM for wrist 4+/5    Done this date moist heat using weight and heat for wrist flexion and extention stretch 2 min - x 2 each prior to AAROM and AROM  Pt to cont at home Assess afterwards if pt can get same improvement using moist heat and stretches -   65 flexion and 50 extention Wrist  Followed by AAROM and AROM wrist flexion <> extention 15 reps ' 1 lbs for wrist ext and flexion -with place and hold wrist extention  2 x 12 reps  2 x day  Maintain progress in ROM in session - pt to do same at home cont 3 lbs ulnar /radial deviation and supination /pronation  No increase symptoms  2 x 12 reps Elbow flexion 3 lbs palm up and thumb up  12 reps each  Rowing or scapula retraction - 3 lbs weight -15 reps Twice a day  Grip and prehension improve since last time  Pt to cont but review  med teal putty for gripping 12 reps - - need mod v/c on technique to decrease edema and pain at wrist afterwards Cont and review lat and 3 point pinch  Teal med putty- 2  x 12 reps  2 x day     PATIENT EDUCATION: Education details: progress  and HEP  Person educated: Patient Education method: Explanation, Demonstration, Tactile cues, Verbal cues, and Handouts Education comprehension: verbalized understanding, returned demonstration, verbal cues required, and needs further education   GOALS: Goals reviewed with patient? Yes  LONG TERM GOALS: Target date:  8 wks   Patient be independent in home program to decrease edema and pain less than the 3 to increase motion making composite fist. Baseline: Pain 3-11/10 wrist and hand.  No knowledge of home program.  Edema at the wrist increased by 2.5 cm in circumference.  MC flexion 60 to 80 degrees and PIP 80 to 95 degrees. Progress and met goal but still increase edema at wrist  Goal status:met  2.  Right wrist active range of motion increased in all planes to within functional limits for patient to use right dominant hand and more than 75% of bathing and dressing. Baseline: Patient 7-1/2 weeks postop.  Wrist brace most of the time.  Wrist extension 15 and flexion 50.  With ulnar deviation 18 and radial deviation 10 Goal status: ONGOING  3.  Right supination improved to 85 or more for patient to turn  doorknob without increase symptoms. Baseline: Supination 65 degrees with pain on ulnar wrist not able to use hand functionally. Goal status: MET  4.  Right wrist strength in all planes in range improved for patient to push and pull heavy door as well as turn doorknob return to driving and do laundry without increase symptoms Baseline: decrease wrist flexion, extention- carry 4 lbs but with some discomfort  Goal status: ONGOING  5.  Right grip and prehension strength in right hand improved to more than 60% compared to the left for patient to carry more than 5 pounds without increase symptoms as well as pull up pants and socks, turn keep Baseline:  R grip 20lbs, L 60 lbs, lat pinch R 8, L 13 lbs, and 3 point pinch R 7 lbs and L 14 lbs  Goal status: ONGOING  ASSESSMENT:  CLINICAL IMPRESSION: Patient seen for occupational therapy for right dominant hand distal radius fracture with ORIF and a carpal tunnel release. Surgery on 08/28/2023.  Patient made good progress from start of care but continues to be limited mostly in edema in the wrist and hand limiting her wrist extension and flexion as well as  composite digits flexion -pt cont to have some numbness in hand and tip of fingers.  Review again with patient to do prolonged flexion and extension wrist stretches using moist heat to increase flexion- extention prior to strengthening - cont 1-3 lbs for wrist , forearm and elbow - good progress in grip and prehension with upgrade to med teal for grip and prehension strength last time.  Pt follow  up with surgeon next week - follow up as needed   PERFORMANCE DEFICITS: in functional skills including ADLs, IADLs, ROM, strength, pain, flexibility, decreased knowledge of use of DME, and UE functional use,   and psychosocial skills including environmental adaptation and routines and behaviors.   IMPAIRMENTS: are limiting patient from ADLs, IADLs, rest and sleep, play, leisure, and social participation.    COMORBIDITIES: has no other co-morbidities that affects occupational performance. Patient will benefit from skilled OT to address above impairments and improve overall function.  MODIFICATION OR ASSISTANCE TO COMPLETE EVALUATION: No modification of tasks or assist necessary to complete an evaluation.  OT OCCUPATIONAL PROFILE AND HISTORY: Problem  focused assessment: Including review of records relating to presenting problem.  CLINICAL DECISION MAKING: LOW - limited treatment options, no task modification necessary  REHAB POTENTIAL: Good for goals  EVALUATION COMPLEXITY: Low   PLAN:  OT FREQUENCY: 2 x wk   OT DURATION: 1 wk  PLANNED INTERVENTIONS: 97535 self care/ADL training, 16109 therapeutic exercise, 97140 manual therapy, 97018 paraffin, 60454 fluidotherapy, 97034 contrast bath, 97760 Orthotics management and training, scar mobilization, passive range of motion, patient/family education, and DME and/or AE instructions  CONSULTED AND AGREED WITH PLAN OF CARE: Patient  Oletta Cohn, OTR/L,CLT 11/12/2023, 4:43 PM

## 2023-11-17 ENCOUNTER — Ambulatory Visit: Payer: Managed Care, Other (non HMO) | Admitting: Occupational Therapy

## 2023-11-18 ENCOUNTER — Other Ambulatory Visit (INDEPENDENT_AMBULATORY_CARE_PROVIDER_SITE_OTHER): Payer: Managed Care, Other (non HMO)

## 2023-11-18 DIAGNOSIS — I10 Essential (primary) hypertension: Secondary | ICD-10-CM

## 2023-11-18 DIAGNOSIS — R739 Hyperglycemia, unspecified: Secondary | ICD-10-CM

## 2023-11-18 DIAGNOSIS — E7800 Pure hypercholesterolemia, unspecified: Secondary | ICD-10-CM | POA: Diagnosis not present

## 2023-11-18 LAB — LIPID PANEL
Cholesterol: 128 mg/dL (ref 0–200)
HDL: 57.8 mg/dL (ref >39.00)
LDL Cholesterol: 53 mg/dL (ref 0–99)
NonHDL: 70.55
Total CHOL/HDL Ratio: 2
Triglycerides: 90 mg/dL (ref 0.0–149.0)
VLDL: 18 mg/dL (ref 0.0–40.0)

## 2023-11-18 LAB — BASIC METABOLIC PANEL
BUN: 11 mg/dL (ref 6–23)
CO2: 24 meq/L (ref 19–32)
Calcium: 9.1 mg/dL (ref 8.4–10.5)
Chloride: 105 meq/L (ref 96–112)
Creatinine, Ser: 0.64 mg/dL (ref 0.40–1.20)
GFR: 93.22 mL/min (ref >60.00)
Glucose, Bld: 102 mg/dL — ABNORMAL HIGH (ref 70–99)
Potassium: 3.7 meq/L (ref 3.5–5.1)
Sodium: 139 meq/L (ref 135–145)

## 2023-11-18 LAB — HEMOGLOBIN A1C: Hgb A1c MFr Bld: 6.2 % (ref 4.6–6.5)

## 2023-11-18 LAB — HEPATIC FUNCTION PANEL
ALT: 17 U/L (ref 0–35)
AST: 24 U/L (ref 0–37)
Albumin: 4.5 g/dL (ref 3.5–5.2)
Alkaline Phosphatase: 48 U/L (ref 39–117)
Bilirubin, Direct: 0.1 mg/dL (ref 0.0–0.3)
Total Bilirubin: 0.6 mg/dL (ref 0.2–1.2)
Total Protein: 7.4 g/dL (ref 6.0–8.3)

## 2023-11-19 ENCOUNTER — Other Ambulatory Visit: Payer: Self-pay | Admitting: Orthopedic Surgery

## 2023-11-20 ENCOUNTER — Encounter: Payer: Self-pay | Admitting: Internal Medicine

## 2023-11-20 ENCOUNTER — Ambulatory Visit (INDEPENDENT_AMBULATORY_CARE_PROVIDER_SITE_OTHER): Payer: Managed Care, Other (non HMO) | Admitting: Internal Medicine

## 2023-11-20 ENCOUNTER — Ambulatory Visit: Payer: Managed Care, Other (non HMO) | Admitting: Occupational Therapy

## 2023-11-20 VITALS — BP 132/74 | HR 75 | Temp 98.0°F | Resp 16 | Ht 67.0 in | Wt 271.0 lb

## 2023-11-20 DIAGNOSIS — E538 Deficiency of other specified B group vitamins: Secondary | ICD-10-CM | POA: Diagnosis not present

## 2023-11-20 DIAGNOSIS — I10 Essential (primary) hypertension: Secondary | ICD-10-CM | POA: Diagnosis not present

## 2023-11-20 DIAGNOSIS — R739 Hyperglycemia, unspecified: Secondary | ICD-10-CM | POA: Diagnosis not present

## 2023-11-20 DIAGNOSIS — E7800 Pure hypercholesterolemia, unspecified: Secondary | ICD-10-CM | POA: Diagnosis not present

## 2023-11-20 DIAGNOSIS — K219 Gastro-esophageal reflux disease without esophagitis: Secondary | ICD-10-CM

## 2023-11-20 DIAGNOSIS — F419 Anxiety disorder, unspecified: Secondary | ICD-10-CM

## 2023-11-20 DIAGNOSIS — G4730 Sleep apnea, unspecified: Secondary | ICD-10-CM

## 2023-11-20 MED ORDER — AMLODIPINE BESYLATE 10 MG PO TABS: 10.0000 mg | ORAL_TABLET | Freq: Every day | ORAL | 3 refills | Status: DC

## 2023-11-20 MED ORDER — BD LUER-LOK SYRINGE 25G X 1" 3 ML MISC: 0 refills | Status: DC

## 2023-11-20 MED ORDER — CYANOCOBALAMIN 1000 MCG/ML IJ SOLN
1000.0000 ug | INTRAMUSCULAR | 0 refills | Status: DC
Start: 2023-11-20 — End: 2024-05-24

## 2023-11-20 MED ORDER — BD DISP NEEDLE 25G X 1" MISC: 0 refills | Status: DC

## 2023-11-20 MED ORDER — CITALOPRAM HYDROBROMIDE 40 MG PO TABS: 40.0000 mg | ORAL_TABLET | Freq: Every day | ORAL | 1 refills | Status: DC

## 2023-11-20 MED ORDER — ROSUVASTATIN CALCIUM 40 MG PO TABS: 40.0000 mg | ORAL_TABLET | Freq: Every day | ORAL | 3 refills | Status: DC

## 2023-11-20 MED ORDER — BUSPIRONE HCL 5 MG PO TABS: 5.0000 mg | ORAL_TABLET | Freq: Two times a day (BID) | ORAL | 1 refills | Status: DC

## 2023-11-20 MED ORDER — SPIRONOLACTONE 25 MG PO TABS: 25.0000 mg | ORAL_TABLET | Freq: Every day | ORAL | 3 refills | Status: DC

## 2023-11-20 NOTE — Assessment & Plan Note (Signed)
 Low carb diet and exercise.  Follow met b and A1c.   Lab Results  Component Value Date   HGBA1C 6.2 11/18/2023

## 2023-11-20 NOTE — Assessment & Plan Note (Signed)
 Continue aldactone .  Follow pressures.  Follow metabolic panel. No changes in medication at this time.

## 2023-11-20 NOTE — Assessment & Plan Note (Signed)
 Continue  cpap. Followed by Dr Belia Heman.

## 2023-11-20 NOTE — Assessment & Plan Note (Signed)
No upper symptoms reported.  On Protonix. 

## 2023-11-20 NOTE — Progress Notes (Signed)
 Subjective:    [name], female    DOB: 02-Apr-1961, 63 y.o.   MRN: [id number]  Patient here for  Chief Complaint  Patient presents with   Medical Management of Chronic Issues    HPI  Here for a scheduled follow up - f/u regarding hypertension and increased stress. Continues on citalopram . Saw cardiology. Recommended echo and coronary CT. Normal coronary CTA. No evidence of CAD. ECHO - normal ejection fraction and mild MR. F/u with cardiology 06/26/23 - stable.Using cpap - OSA. Is s/p ORIF of the right distal radius and carpal tunnel release 08/28/23. Has been attending hand therapy. Sw Dr Kathlynn 11/16/23 - f/u - painful hardware. Planning for right wrist DVR plate removal - 12/01/23. She is doing well. No chest pain. Breathing stable. No cough or congestion. Did well with her last surgery. No abdominal pain.  Bowels moving. Handling stress. Discussed labs.    Past Medical History:  Diagnosis Date   Allergy    Anemia    Cancer (HCC)    Depression    Diverticulosis    FHx: migraine headaches    GERD (gastroesophageal reflux disease)    History of chickenpox    Hypercholesterolemia    Hyperlipidemia    Hypertension    Morbid obesity with BMI of 40.0-44.9, adult (HCC)    Motion sickness    ocean boats   OSA on CPAP    Osteoarthritis    Panic attacks    Sleep apnea    CPAP   Vertigo    none for over 10 yrs   Past Surgical History:  Procedure Laterality Date   ABDOMINAL HYSTERECTOMY     CATARACT EXTRACTION W/PHACO Left 08/11/2017   Procedure: CATARACT EXTRACTION PHACO AND INTRAOCULAR LENS PLACEMENT (IOC);  Surgeon: Jaye Fallow, MD;  Location: [facility];  Service: Ophthalmology;  Laterality: Left;  US  00:31 AP% 19.2 CDE 6.13 Fluid Pack lot # [id number]   CATARACT EXTRACTION W/PHACO Right 09/01/2017   Procedure: CATARACT EXTRACTION PHACO AND INTRAOCULAR LENS PLACEMENT (IOC);  Surgeon: Jaye Fallow, MD;  Location: [facility];  Service: Ophthalmology;   Laterality: Right;  US  00:30 AP% 11.7 CDE3.52 fluid pack lot #[id number]   Child Birth  1983 and 1979   COLONOSCOPY     COLONOSCOPY WITH PROPOFOL  N/A 08/25/2018   Procedure: COLONOSCOPY WITH PROPOFOL ;  Surgeon: Toledo, Ladell POUR, MD;  Location: [facility];  Service: Gastroenterology;  Laterality: N/A;   ESOPHAGOGASTRODUODENOSCOPY     ESOPHAGOGASTRODUODENOSCOPY (EGD) WITH PROPOFOL  N/A 08/25/2018   Procedure: ESOPHAGOGASTRODUODENOSCOPY (EGD) WITH PROPOFOL ;  Surgeon: Toledo, Ladell POUR, MD;  Location: [facility];  Service: Gastroenterology;  Laterality: N/A;   FRACTURE SURGERY     LAPAROSCOPIC SUPRACERVICAL HYSTERECTOMY  2006   ovaries left in place   left elbow     left knee     Miscarriage  1987   OPEN REDUCTION INTERNAL FIXATION (ORIF) DISTAL RADIAL FRACTURE Right 08/28/2023   Procedure: Open reduction & internal fixation of right distal radius and right carpal tunnel release;  Surgeon: Kathlynn Sharper, MD;  Location: [facility];  Service: Orthopedics;  Laterality: Right;   TONSILLECTOMY  1963   TUBAL LIGATION  1993   Family History  Problem Relation Age of Onset   Heart disease Mother        Incomplete heart block   Hypertension Mother    Diabetes Mother    Nephrolithiasis Mother    Migraines Mother    Arthritis Mother  degenerative-back,osteoporosis   Heart failure Mother    Heart disease Father 7       Myocardial infarction   Heart failure Father    Heart disease Sister        h/o MI   Migraines Sister    Breast cancer Neg Hx    Social History   Socioeconomic History   Marital status: Married    Spouse name: Not on file   Number of children: Not on file   Years of education: Not on file   Highest education level: Not on file  Occupational History   Not on file  Tobacco Use   Smoking status: Former    Current packs/day: 0.00    Average packs/day: 0.1 packs/day for 2.5 years (0.3 ttl pk-yrs)    Types: Cigarettes    Start date: 04/1994     Quit date: 10/13/1996    Years since quitting: 27.1   Smokeless tobacco: Never  Vaping Use   Vaping status: Never Used  Substance and Sexual Activity   Alcohol use: No   Drug use: No   Sexual activity: Not on file  Other Topics Concern   Not on file  Social History Narrative   Not on file   Social Drivers of Health   Financial Resource Strain: Medium Risk (09/14/2023)   Received from [facility]   Overall Financial Resource Strain (CARDIA)    Difficulty of Paying Living Expenses: Somewhat hard  Food Insecurity: Food Insecurity Present (09/14/2023)   Received from [facility]   Hunger Vital Sign    Worried About Running Out of Food in the Last Year: Often true    Ran Out of Food in the Last Year: Often true  Transportation Needs: No Transportation Needs (09/14/2023)   Received from [facility] - Transportation    In the past 12 months, has lack of transportation kept you from medical appointments or from getting medications?: No    Lack of Transportation (Non-Medical): No  Physical Activity: Not on file  Stress: Not on file  Social Connections: Not on file     Review of Systems  Constitutional:  Negative for appetite change and unexpected weight change.  HENT:  Negative for congestion and sinus pressure.   Respiratory:  Negative for cough, chest tightness and shortness of breath.   Cardiovascular:  Negative for chest pain, palpitations and leg swelling.  Gastrointestinal:  Negative for abdominal pain, diarrhea, nausea and vomiting.  Genitourinary:  Negative for difficulty urinating and dysuria.  Musculoskeletal:  Negative for joint swelling and myalgias.  Skin:  Negative for color change and rash.  Neurological:  Negative for dizziness and headaches.  Psychiatric/Behavioral:  Negative for agitation and dysphoric mood.        Objective:     BP 132/74   Pulse 75   Temp 98 F (36.7 C)   Resp 16   Ht 5' 7  (1.702 m)   Wt 271 lb (122.9 kg)   SpO2 98%   BMI 42.44 kg/m  Wt Readings from Last 3 Encounters:  11/20/23 271 lb (122.9 kg)  08/28/23 271 lb 12.8 oz (123.3 kg)  08/25/23 274 lb (124.3 kg)    Physical Exam Vitals reviewed.  Constitutional:      General: She is not in acute distress.    Appearance: Normal appearance.  HENT:     Head: Normocephalic and atraumatic.     Right Ear: External ear normal.  Left Ear: External ear normal.     Mouth/Throat:     Pharynx: No oropharyngeal exudate or posterior oropharyngeal erythema.  Eyes:     General: No scleral icterus.       Right eye: No discharge.        Left eye: No discharge.     Conjunctiva/sclera: Conjunctivae normal.  Neck:     Thyroid : No thyromegaly.  Cardiovascular:     Rate and Rhythm: Normal rate and regular rhythm.  Pulmonary:     Effort: No respiratory distress.     Breath sounds: Normal breath sounds. No wheezing.  Abdominal:     General: Bowel sounds are normal.     Palpations: Abdomen is soft.     Tenderness: There is no abdominal tenderness.  Musculoskeletal:        General: No swelling or tenderness.     Cervical back: Neck supple. No tenderness.  Lymphadenopathy:     Cervical: No cervical adenopathy.  Skin:    Findings: No erythema or rash.  Neurological:     Mental Status: She is alert.  Psychiatric:        Mood and Affect: Mood normal.        Behavior: Behavior normal.         Outpatient Encounter Medications as of 11/20/2023  Medication Sig   acetaminophen  (TYLENOL ) 650 MG CR tablet Take 650 mg by mouth every 8 (eight) hours as needed for pain.   amLODipine  (NORVASC ) 10 MG tablet Take 1 tablet (10 mg total) by mouth daily.   Blood Glucose Monitoring Suppl DEVI Use as directed to check blood sugars twice daily.   busPIRone  (BUSPAR ) 5 MG tablet Take 1 tablet (5 mg total) by mouth 2 (two) times daily.   citalopram  (CELEXA ) 40 MG tablet Take 1 tablet (40 mg total) by mouth daily.    cyanocobalamin  (VITAMIN B12) 1000 MCG/ML injection Inject 1 mL (1,000 mcg total) into the muscle every 30 (thirty) days.   docusate sodium  (COLACE) 100 MG capsule Take 1 tablet once or twice daily as needed for constipation while taking narcotic pain medicine   Fructose-Dextrose -Phosphor Acd (SB ANTI-NAUSEA PO) Take by mouth.   Glucose Blood (BLOOD GLUCOSE TEST STRIPS) STRP Use as directed to check blood sugars twice daily.   HYDROcodone -acetaminophen  (NORCO/VICODIN) 5-325 MG tablet Take 2 tablets by mouth every 6 (six) hours as needed for up to 40 doses.   Lancets Misc. MISC Use as directed to check blood sugars twice daily.   NEEDLE, DISP, 25 G (B-D DISP NEEDLE 25GX1) 25G X 1 MISC Used to give monthly B-12 injections   ondansetron  (ZOFRAN -ODT) 4 MG disintegrating tablet Allow 1-2 tablets to dissolve in your mouth every 8 hours as needed for nausea/vomiting   pantoprazole  (PROTONIX ) 40 MG tablet Take 40 mg by mouth daily.   rosuvastatin  (CRESTOR ) 40 MG tablet Take 1 tablet (40 mg total) by mouth daily.   spironolactone  (ALDACTONE ) 25 MG tablet Take 1 tablet (25 mg total) by mouth daily.   SYRINGE-NEEDLE, DISP, 3 ML (B-D 3CC LUER-LOK SYR 25GX1) 25G X 1 3 ML MISC USE TO GIVE MONTHLY B-12 INJECTIONS   [DISCONTINUED] albuterol  (VENTOLIN  HFA) 108 (90 Base) MCG/ACT inhaler Inhale 2 puffs into the lungs every 6 (six) hours as needed for wheezing or shortness of breath. (Patient not taking: Reported on 08/19/2023)   [DISCONTINUED] amLODipine  (NORVASC ) 10 MG tablet Take 1 tablet (10 mg total) by mouth daily.   [DISCONTINUED] B-D 3CC LUER-LOK SYR 25GX1 25G  X 1 3 ML MISC TO GIVE MONTHLY B-12 INJECTIONS   [DISCONTINUED] busPIRone  (BUSPAR ) 5 MG tablet Take 1 tablet (5 mg total) by mouth 2 (two) times daily.   [DISCONTINUED] citalopram  (CELEXA ) 40 MG tablet Take 1 tablet (40 mg total) by mouth daily.   [DISCONTINUED] cyanocobalamin  (VITAMIN B12) 1000 MCG/ML injection INJECT 1 ML INTO THE MUSCLE  ONCE EVERY  MONTH   [DISCONTINUED] NEEDLE, DISP, 25 G (B-D DISP NEEDLE 25GX1) 25G X 1 MISC Used to give monthly B-12 injections   [DISCONTINUED] rosuvastatin  (CRESTOR ) 40 MG tablet Take 1 tablet (40 mg total) by mouth daily.   [DISCONTINUED] spironolactone  (ALDACTONE ) 25 MG tablet Take 1 tablet by mouth once daily   [DISCONTINUED] SYRINGE-NEEDLE, DISP, 3 ML (B-D INTEGRA SYRINGE) 25G X 5/8 3 ML MISC Used to draw monthly B-12 injections.   No facility-administered encounter medications on file as of 11/20/2023.     Lab Results  Component Value Date   WBC 8.1 08/14/2023   HGB 13.8 08/14/2023   HCT 42.0 08/14/2023   PLT 242.0 08/14/2023   GLUCOSE 102 (H) 11/18/2023   CHOL 128 11/18/2023   TRIG 90.0 11/18/2023   HDL 57.80 11/18/2023   LDLDIRECT 73.0 12/26/2022   LDLCALC 53 11/18/2023   ALT 17 11/18/2023   AST 24 11/18/2023   NA 139 11/18/2023   K 3.7 11/18/2023   CL 105 11/18/2023   CREATININE 0.64 11/18/2023   BUN 11 11/18/2023   CO2 24 11/18/2023   TSH 0.610 04/27/2023   HGBA1C 6.2 11/18/2023    MM 3D SCREENING MAMMOGRAM BILATERAL BREAST Result Date: 09/18/2023 CLINICAL DATA:  Screening. EXAM: DIGITAL SCREENING BILATERAL MAMMOGRAM WITH TOMOSYNTHESIS AND CAD TECHNIQUE: Bilateral screening digital craniocaudal and mediolateral oblique mammograms were obtained. Bilateral screening digital breast tomosynthesis was performed. The images were evaluated with computer-aided detection. COMPARISON:  Previous exam(s). ACR Breast Density Category a: The breasts are almost entirely fatty. FINDINGS: There are no findings suspicious for malignancy. IMPRESSION: No mammographic evidence of malignancy. A result letter of this screening mammogram will be mailed directly to the patient. RECOMMENDATION: Screening mammogram in one year. (Code:SM-B-01Y) BI-RADS CATEGORY  1: Negative. Electronically Signed   By: Inocente Ast M.D.   On: 09/18/2023 14:26       Assessment & Plan:  Hypercholesterolemia Assessment  & Plan: On crestor .  Low-cholesterol diet and exercise.  Follow lipid panel liver function tests.   Lab Results  Component Value Date   CHOL 128 11/18/2023   HDL 57.80 11/18/2023   LDLCALC 53 11/18/2023   LDLDIRECT 73.0 12/26/2022   TRIG 90.0 11/18/2023   CHOLHDL 2 11/18/2023    Orders: -     Lipid panel; Future -     Hepatic function panel; Future  Hyperglycemia Assessment & Plan: Low carb diet and exercise.  Follow met b and A1c.   Lab Results  Component Value Date   HGBA1C 6.2 11/18/2023    Orders: -     Hemoglobin A1c; Future  Essential hypertension, benign Assessment & Plan: Continue aldactone .  Follow pressures.  Follow metabolic panel. No changes in medication at this time.   Orders: -     Basic metabolic panel; Future -     TSH; Future  B12 deficiency -     Cyanocobalamin ; Inject 1 mL (1,000 mcg total) into the muscle every 30 (thirty) days.  Dispense: 10 mL; Refill: 0  Anxiety Assessment & Plan: Increased stress as outlined.  Discussed.  Overall doing better. Continue celexa . No  changes. Follow.    Gastroesophageal reflux disease, unspecified whether esophagitis present Assessment & Plan: No upper symptoms reported.  On Protonix .   Sleep apnea, unspecified type Assessment & Plan: Continue  cpap. Followed by Dr Isaiah.    Other orders -     amLODIPine  Besylate; Take 1 tablet (10 mg total) by mouth daily.  Dispense: 90 tablet; Refill: 3 -     BD Luer-Lok Syringe; USE TO GIVE MONTHLY B-12 INJECTIONS  Dispense: 50 each; Refill: 0 -     busPIRone  HCl; Take 1 tablet (5 mg total) by mouth 2 (two) times daily.  Dispense: 180 tablet; Refill: 1 -     Citalopram  Hydrobromide; Take 1 tablet (40 mg total) by mouth daily.  Dispense: 90 tablet; Refill: 1 -     BD Disp Needle; Used to give monthly B-12 injections  Dispense: 50 each; Refill: 0 -     Rosuvastatin  Calcium ; Take 1 tablet (40 mg total) by mouth daily.  Dispense: 90 tablet; Refill: 3 -     Spironolactone ;  Take 1 tablet (25 mg total) by mouth daily.  Dispense: 90 tablet; Refill: 3     Allena Hamilton, MD

## 2023-11-20 NOTE — Assessment & Plan Note (Signed)
Increased stress as outlined.  Discussed.  Overall doing better. Continue celexa. No changes. Follow.

## 2023-11-20 NOTE — Assessment & Plan Note (Signed)
 On crestor .  Low-cholesterol diet and exercise.  Follow lipid panel liver function tests.   Lab Results  Component Value Date   CHOL 128 11/18/2023   HDL 57.80 11/18/2023   LDLCALC 53 11/18/2023   LDLDIRECT 73.0 12/26/2022   TRIG 90.0 11/18/2023   CHOLHDL 2 11/18/2023

## 2023-11-23 ENCOUNTER — Ambulatory Visit: Payer: Managed Care, Other (non HMO) | Admitting: Family Medicine

## 2023-11-23 ENCOUNTER — Encounter: Payer: Self-pay | Admitting: Family Medicine

## 2023-11-23 ENCOUNTER — Ambulatory Visit: Payer: Self-pay | Admitting: Internal Medicine

## 2023-11-23 VITALS — BP 120/74 | HR 93 | Temp 100.2°F | Ht 67.0 in | Wt 265.0 lb

## 2023-11-23 DIAGNOSIS — J101 Influenza due to other identified influenza virus with other respiratory manifestations: Secondary | ICD-10-CM | POA: Diagnosis not present

## 2023-11-23 LAB — POCT INFLUENZA A/B
Influenza A, POC: POSITIVE — AB
Influenza B, POC: NEGATIVE

## 2023-11-23 LAB — POC COVID19 BINAXNOW: SARS Coronavirus 2 Ag: NEGATIVE

## 2023-11-23 MED ORDER — GUAIFENESIN-CODEINE 100-10 MG/5ML PO SOLN: 10.0000 mL | Freq: Three times a day (TID) | ORAL | 0 refills | Status: DC | PRN

## 2023-11-23 MED ORDER — OSELTAMIVIR PHOSPHATE 75 MG PO CAPS: 75.0000 mg | ORAL_CAPSULE | Freq: Two times a day (BID) | ORAL | 0 refills | Status: DC

## 2023-11-23 NOTE — Telephone Encounter (Signed)
  Additional Notes: This Triage RN Attempted to call patient. No Answer. Left Voicemail.

## 2023-11-23 NOTE — Progress Notes (Signed)
 Nelly Banco, MD Phone: [phone]  Heather Blake is a 63 y.o. female who presents today for same-day visit.  Cough: Onset 2 days ago.  Has cough productive of creamy mucus.  Has had and chest congestion.  Has had fevers.  Some shortness of breath with coughing.  Some postnasal drip.  No taste or smell changes.  Has had some diarrhea that is nonbloody.  No vomiting.  Does have body aches and headache as well.  Social History   Tobacco Use  Smoking Status Former   Current packs/day: 0.00   Average packs/day: 0.1 packs/day for 2.5 years (0.3 ttl pk-yrs)   Types: Cigarettes   Start date: 04/1994   Quit date: 10/13/1996   Years since quitting: 27.1  Smokeless Tobacco Never    Current Outpatient Medications on File Prior to Visit  Medication Sig Dispense Refill   acetaminophen  (TYLENOL ) 650 MG CR tablet Take 650 mg by mouth every 8 (eight) hours as needed for pain.     amLODipine  (NORVASC ) 10 MG tablet Take 1 tablet (10 mg total) by mouth daily. 90 tablet 3   Blood Glucose Monitoring Suppl DEVI Use as directed to check blood sugars twice daily. 1 each 0   busPIRone  (BUSPAR ) 5 MG tablet Take 1 tablet (5 mg total) by mouth 2 (two) times daily. 180 tablet 1   citalopram  (CELEXA ) 40 MG tablet Take 1 tablet (40 mg total) by mouth daily. 90 tablet 1   cyanocobalamin  (VITAMIN B12) 1000 MCG/ML injection Inject 1 mL (1,000 mcg total) into the muscle every 30 (thirty) days. 10 mL 0   docusate sodium  (COLACE) 100 MG capsule Take 1 tablet once or twice daily as needed for constipation while taking narcotic pain medicine 96.42 capsule 0   Fructose-Dextrose -Phosphor Acd (SB ANTI-NAUSEA PO) Take by mouth.     Glucose Blood (BLOOD GLUCOSE TEST STRIPS) STRP Use as directed to check blood sugars twice daily. 100 strip 11   HYDROcodone -acetaminophen  (NORCO/VICODIN) 5-325 MG tablet Take 2 tablets by mouth every 6 (six) hours as needed for up to 40 doses. 40 tablet 0   Lancets Misc. MISC Use as directed  to check blood sugars twice daily. 100 each 11   NEEDLE, DISP, 25 G (B-D DISP NEEDLE 25GX1") 25G X 1" MISC Used to give monthly B-12 injections 50 each 0   ondansetron  (ZOFRAN -ODT) 4 MG disintegrating tablet Allow 1-2 tablets to dissolve in your mouth every 8 hours as needed for nausea/vomiting 27 tablet 0   pantoprazole  (PROTONIX ) 40 MG tablet Take 40 mg by mouth daily.     rosuvastatin  (CRESTOR ) 40 MG tablet Take 1 tablet (40 mg total) by mouth daily. 90 tablet 3   spironolactone  (ALDACTONE ) 25 MG tablet Take 1 tablet (25 mg total) by mouth daily. 90 tablet 3   SYRINGE-NEEDLE, DISP, 3 ML (B-D 3CC LUER-LOK SYR 25GX1") 25G X 1" 3 ML MISC USE TO GIVE MONTHLY B-12 INJECTIONS 50 each 0   No current facility-administered medications on file prior to visit.     ROS see history of present illness  Objective  Physical Exam Vitals:   11/23/23 1441  BP: 120/74  Pulse: 93  Temp: 100.2 F (37.9 C)  SpO2: 100%    BP Readings from Last 3 Encounters:  11/23/23 120/74  11/20/23 132/74  08/28/23 121/65   Wt Readings from Last 3 Encounters:  11/23/23 265 lb (120.2 kg)  11/20/23 271 lb (122.9 kg)  08/28/23 271 lb 12.8 oz (123.3 kg)  Physical Exam Constitutional:      General: She is not in acute distress.    Appearance: She is not diaphoretic.  HENT:     Mouth/Throat:     Mouth: Mucous membranes are moist.     Pharynx: Posterior oropharyngeal erythema present. No oropharyngeal exudate.  Cardiovascular:     Rate and Rhythm: Normal rate and regular rhythm.     Heart sounds: Normal heart sounds.  Pulmonary:     Effort: Pulmonary effort is normal.     Breath sounds: Normal breath sounds.  Lymphadenopathy:     Cervical: No cervical adenopathy.  Skin:    General: Skin is warm and dry.  Neurological:     Mental Status: She is alert.      Assessment/Plan: Please see individual problem list.  Influenza A Assessment & Plan: Acute issue with systemic symptoms of fever.  Patient  with positive influenza A test with symptoms of influenza.  Discussed treatment with Tamiflu .  Discussed on average Tamiflu  shortens duration of influenza by about a day.  Discussed small risk of psychiatric disturbances on this medication and advised if those occurred she needs to let us  know right away.  We will treat cough with codeine  cough medication.  Advised on the risk of drowsiness with this.  If she is excessively drowsy she will discontinue use of the codeine  cough suppressant.  Advised to seek medical attention for worsening shortness of breath, cough productive of blood, or fever of 103 F or higher that does not come down with Tylenol .  Discussed she should start to improve towards the end of this week.  Discussed if the Tamiflu  is too expensive she did not have to pick it up and take it.  Advised that she would improve on her own with time.  Orders: -     Oseltamivir  Phosphate; Take 1 capsule (75 mg total) by mouth 2 (two) times daily.  Dispense: 10 capsule; Refill: 0 -     guaiFENesin -Codeine ; Take 10 mLs by mouth every 8 (eight) hours as needed for cough.  Dispense: 120 mL; Refill: 0 -     POCT Influenza A/B -     POC COVID-19 BinaxNow   Counseled patient to have her husband contact his provider to discuss prophylactic Tamiflu .   Return if symptoms worsen or fail to improve.   Nelly Banco, MD [facility]

## 2023-11-23 NOTE — Assessment & Plan Note (Addendum)
 Acute issue with systemic symptoms of fever.  Patient with positive influenza A test with symptoms of influenza.  Discussed treatment with Tamiflu .  Discussed on average Tamiflu  shortens duration of influenza by about a day.  Discussed small risk of psychiatric disturbances on this medication and advised if those occurred she needs to let us  know right away.  We will treat cough with codeine  cough medication.  Advised on the risk of drowsiness with this.  If she is excessively drowsy she will discontinue use of the codeine  cough suppressant.  Advised to seek medical attention for worsening shortness of breath, cough productive of blood, or fever of 103 F or higher that does not come down with Tylenol .  Discussed she should start to improve towards the end of this week.  Discussed if the Tamiflu  is too expensive she did not have to pick it up and take it.  Advised that she would improve on her own with time.

## 2023-11-23 NOTE — Telephone Encounter (Signed)
 Chief Complaint: Coughing/Sick Symptoms: coughing, chest soreness, congestion, headache Frequency: since Saturday Pertinent Negatives: Patient denies n/a Disposition: [] ED /[] Urgent Care (no appt availability in office) / [x] Appointment(In office/virtual)/ []  [facility]/ [] Home Care/ [] Refused Recommended Disposition /[] [facility]/ []  Follow-up with PCP Additional Notes: Patient called stating she has been feeling ill since Saturday. Patient has been having a productive cough with chest soreness, mild SOB, headache. Patient is unsure if she has had fever but states she is switching between feeling hot and cold. Patient's husband is terminally ill and she has been exposed to previous sick clinicians at his appointments. Patient was scheduled for wrist surgery but had to cancel due to illness, and is looking for further evaluation. Patient appointment made for today in-office with HCP.    Reason for Disposition  [1] MILD difficulty breathing (e.g., minimal/no SOB at rest, SOB with walking, pulse <100) AND [2] still present when not coughing  Answer Assessment - Initial Assessment Questions 1. ONSET: "When did the cough begin?"      Saturday morning 2. SEVERITY: "How bad is the cough today?"      Moderately cough 3. SPUTUM: "Describe the color of your sputum" (none, dry cough; clear, white, yellow, green)     beige 4. HEMOPTYSIS: "Are you coughing up any blood?" If so ask: "How much?" (flecks, streaks, tablespoons, etc.)     No  5. DIFFICULTY BREATHING: "Are you having difficulty breathing?" If Yes, ask: "How bad is it?" (e.g., mild, moderate, severe)    - MILD: No SOB at rest, mild SOB with walking, speaks normally in sentences, can lie down, no retractions, pulse < 100.    - MODERATE: SOB at rest, SOB with minimal exertion and prefers to sit, cannot lie down flat, speaks in phrases, mild retractions, audible wheezing, pulse 100-120.    - SEVERE: Very SOB at rest,  speaks in single words, struggling to breathe, sitting hunched forward, retractions, pulse > 120      Mild 6. FEVER: "Do you have a fever?" If Yes, ask: "What is your temperature, how was it measured, and when did it start?"     Haven't checked 7. CARDIAC HISTORY: "Do you have any history of heart disease?" (e.g., heart attack, congestive heart failure)      No 8. LUNG HISTORY: "Do you have any history of lung disease?"  (e.g., pulmonary embolus, asthma, emphysema)     Cpap machine at night 9. PE RISK FACTORS: "Do you have a history of blood clots?" (or: recent major surgery, recent prolonged travel, bedridden)     No 10. OTHER SYMPTOMS: "Do you have any other symptoms?" (e.g., runny nose, wheezing, chest pain)       Headache, chest congestion, coughing with phlegm, chest soreness, hot and cold flashes  12. TRAVEL: "Have you traveled out of the country in the last month?" (e.g., travel history, exposures)       No  Protocols used: Cough - Acute Productive-A-AH

## 2023-11-23 NOTE — Patient Instructions (Signed)
 Nice to see you. You have influenza. If you develop shortness of breath, cough productive of blood, or fever 103 F or higher that does not come down with Tylenol  please seek medical attention immediately.  Please let us  know if you are not improving by the end of the week.

## 2023-11-23 NOTE — Telephone Encounter (Signed)
 Noted.

## 2023-11-27 ENCOUNTER — Other Ambulatory Visit: Payer: Self-pay | Admitting: Internal Medicine

## 2023-12-01 ENCOUNTER — Ambulatory Visit
Admission: RE | Admit: 2023-12-01 | Payer: Managed Care, Other (non HMO) | Source: Home / Self Care | Admitting: Orthopedic Surgery

## 2023-12-01 ENCOUNTER — Encounter: Admission: RE | Payer: Self-pay | Source: Home / Self Care

## 2023-12-01 SURGERY — REMOVAL, HARDWARE
Anesthesia: Choice | Laterality: Right

## 2023-12-07 ENCOUNTER — Encounter: Payer: Managed Care, Other (non HMO) | Admitting: Occupational Therapy

## 2023-12-13 ENCOUNTER — Emergency Department

## 2023-12-13 ENCOUNTER — Emergency Department
Admission: EM | Admit: 2023-12-13 | Discharge: 2023-12-14 | Disposition: A | Discharge status: Home / Self Care | Attending: Emergency Medicine | Admitting: Emergency Medicine

## 2023-12-13 ENCOUNTER — Other Ambulatory Visit: Payer: Self-pay

## 2023-12-13 DIAGNOSIS — M79621 Pain in right upper arm: Secondary | ICD-10-CM | POA: Diagnosis present

## 2023-12-13 DIAGNOSIS — I10 Essential (primary) hypertension: Secondary | ICD-10-CM | POA: Diagnosis not present

## 2023-12-13 DIAGNOSIS — M79601 Pain in right arm: Secondary | ICD-10-CM

## 2023-12-13 NOTE — ED Provider Notes (Signed)
   [facility] Provider Note    Event Date/Time   First MD Initiated Contact with Patient 12/13/23 2258     (approximate)   History   Foreign Body   HPI  Heather Blake is a 63 y.o. female with history of hypertension, hyperlipidemia who comes in with concerns for foreign body in the right upper arm.  Patient has been does monthly B12 injections.  They had 1 last needle and when he injected it and pulled off the syringe he stated that the needle never came back out.  Denies ever happening previously.  She reports some discomfort at area of injection.   Physical Exam   Triage Vital Signs: ED Triage Vitals [12/13/23 2059]  Encounter Vitals Group     BP      Systolic BP Percentile      Diastolic BP Percentile      Pulse      Resp      Temp      Temp src      SpO2      Weight 264 lb (119.7 kg)     Height 5\' 7"  (1.702 m)     Head Circumference      Peak Flow      Pain Score 4     Pain Loc      Pain Education      Exclude from Growth Chart     Most recent vital signs: There were no vitals filed for this visit.   General: Awake, no distress.  CV:  Good peripheral perfusion.  Resp:  Normal effort.  Abd:  No distention.  Other:  Small dot noted where injection was.  No obvious redness, erythema, swelling   ED Results / Procedures / Treatments   Labs (all labs ordered are listed, but only abnormal results are displayed) Labs Reviewed - No data to display   EKG  My interpretation of EKG:    RADIOLOGY I have reviewed the xray personally and interpreted no evidence of foreign body   PROCEDURES:  Critical Care performed: No  Procedures   MEDICATIONS ORDERED IN ED: Medications - No data to display   IMPRESSION / MDM / ASSESSMENT AND PLAN / ED COURSE  I reviewed the triage vital signs and the nursing notes.   Patient's presentation is most consistent with acute, uncomplicated illness.   Patient comes in with concerns for  foreign body in the right arm including a needle.  X-ray without evidence of foreign body I attempted to use an ultrasound but difficult to appreciate any foreign body.  Discussed with the radiologist who recommended CT without contrast with vitamin E to mark the spot for evaluation of needle  The patient is on the cardiac monitor to evaluate for evidence of arrhythmia and/or significant heart rate changes.      FINAL CLINICAL IMPRESSION(S) / ED DIAGNOSES   Final diagnoses:  None     Rx / DC Orders   ED Discharge Orders     None        Note:  This document was prepared using Dragon voice recognition software and may include unintentional dictation errors.

## 2023-12-13 NOTE — ED Triage Notes (Addendum)
 Pt arrived POV for a needle in her right upper arm, pt reports giving herself her B12 injection and the needle broke off into her arm, pt reports soreness at the injection site. No redness or swelling noted at this time. Possible the needle gage is 25g 1 in needle.

## 2023-12-14 NOTE — Discharge Instructions (Signed)
 Return to the ER if you develop fevers, redness or any other concerns but at this time I do not see any obvious signs of a foreign body both on x-ray, CT imaging.  IMPRESSION: No radiopaque foreign body is noted. No soft tissue abnormality is seen.

## 2023-12-23 ENCOUNTER — Other Ambulatory Visit: Payer: Self-pay | Admitting: Orthopedic Surgery

## 2023-12-24 ENCOUNTER — Encounter: Payer: Self-pay | Admitting: Orthopedic Surgery

## 2023-12-24 NOTE — Anesthesia Preprocedure Evaluation (Addendum)
 Anesthesia Evaluation  Patient identified by MRN, date of birth, ID band Patient awake    Reviewed: Allergy & Precautions, H&P , NPO status , Patient's Chart, lab work & pertinent test results  Airway Mallampati: III  TM Distance: <3 FB Neck ROM: Full    Dental no notable dental hx.    Pulmonary neg pulmonary ROS, sleep apnea , former smoker   Pulmonary exam normal breath sounds clear to auscultation       Cardiovascular hypertension, negative cardio ROS Normal cardiovascular exam Rhythm:Regular Rate:Normal     Neuro/Psych  PSYCHIATRIC DISORDERS Anxiety Depression    negative neurological ROS  negative psych ROS   GI/Hepatic negative GI ROS, Neg liver ROS,GERD  ,,  Endo/Other  negative endocrine ROS    Renal/GU negative Renal ROS  negative genitourinary   Musculoskeletal negative musculoskeletal ROS (+) Arthritis ,    Abdominal   Peds negative pediatric ROS (+)  Hematology negative hematology ROS (+) Blood dyscrasia, anemia   Anesthesia Other Findings Previous  surgery ORIF right distal radius 08-28-23 Dr. Juel Burrow anesthesiologist  Hypercholesterolemia  Diverticulosis Anemia  Hypertension Depression  Allergy History of chickenpox  Osteoarthritis FHx: migraine headaches  GERD (gastroesophageal reflux disease) Panic attacks  Hyperlipidemia Cancer  Sleep apnea Vertigo  Motion sickness OSA on CPAP Morbid obesity with BMI of 40.0-44.9, adult (HCC)    Reproductive/Obstetrics negative OB ROS                             Anesthesia Physical Anesthesia Plan  ASA: 3  Anesthesia Plan: General   Post-op Pain Management:    Induction: Intravenous  PONV Risk Score and Plan:   Airway Management Planned: Oral ETT  Additional Equipment:   Intra-op Plan:   Post-operative Plan: Extubation in OR  Informed Consent: I have reviewed the patients History and Physical, chart, labs  and discussed the procedure including the risks, benefits and alternatives for the proposed anesthesia with the patient or authorized representative who has indicated his/her understanding and acceptance.     Dental Advisory Given  Plan Discussed with: Anesthesiologist, CRNA and Surgeon  Anesthesia Plan Comments: (Patient consented for risks of anesthesia including but not limited to:  - adverse reactions to medications - damage to eyes, teeth, lips or other oral mucosa - nerve damage due to positioning  - sore throat or hoarseness - Damage to heart, brain, nerves, lungs, other parts of body or loss of life  Patient voiced understanding and assent.)        Anesthesia Quick Evaluation

## 2023-12-29 ENCOUNTER — Encounter
Admission: RE | Disposition: A | Discharge status: Home / Self Care | Payer: Self-pay | Source: Home / Self Care | Attending: Orthopedic Surgery

## 2023-12-29 ENCOUNTER — Other Ambulatory Visit: Payer: Self-pay

## 2023-12-29 ENCOUNTER — Ambulatory Visit: Payer: Self-pay | Admitting: Anesthesiology

## 2023-12-29 ENCOUNTER — Ambulatory Visit: Payer: Self-pay

## 2023-12-29 ENCOUNTER — Encounter: Payer: Self-pay | Admitting: Orthopedic Surgery

## 2023-12-29 ENCOUNTER — Ambulatory Visit
Admission: RE | Admit: 2023-12-29 | Discharge: 2023-12-29 | Disposition: A | Discharge status: Home / Self Care | Attending: Orthopedic Surgery | Admitting: Orthopedic Surgery

## 2023-12-29 DIAGNOSIS — K219 Gastro-esophageal reflux disease without esophagitis: Secondary | ICD-10-CM | POA: Insufficient documentation

## 2023-12-29 DIAGNOSIS — F32A Depression, unspecified: Secondary | ICD-10-CM | POA: Diagnosis not present

## 2023-12-29 DIAGNOSIS — T8484XA Pain due to internal orthopedic prosthetic devices, implants and grafts, initial encounter: Secondary | ICD-10-CM | POA: Insufficient documentation

## 2023-12-29 DIAGNOSIS — I10 Essential (primary) hypertension: Secondary | ICD-10-CM | POA: Diagnosis not present

## 2023-12-29 DIAGNOSIS — E7800 Pure hypercholesterolemia, unspecified: Secondary | ICD-10-CM | POA: Insufficient documentation

## 2023-12-29 DIAGNOSIS — R0602 Shortness of breath: Secondary | ICD-10-CM | POA: Insufficient documentation

## 2023-12-29 DIAGNOSIS — Z79899 Other long term (current) drug therapy: Secondary | ICD-10-CM | POA: Diagnosis not present

## 2023-12-29 DIAGNOSIS — M1990 Unspecified osteoarthritis, unspecified site: Secondary | ICD-10-CM | POA: Diagnosis not present

## 2023-12-29 DIAGNOSIS — Z87891 Personal history of nicotine dependence: Secondary | ICD-10-CM | POA: Insufficient documentation

## 2023-12-29 DIAGNOSIS — D649 Anemia, unspecified: Secondary | ICD-10-CM | POA: Insufficient documentation

## 2023-12-29 DIAGNOSIS — F419 Anxiety disorder, unspecified: Secondary | ICD-10-CM | POA: Insufficient documentation

## 2023-12-29 DIAGNOSIS — G4730 Sleep apnea, unspecified: Secondary | ICD-10-CM | POA: Insufficient documentation

## 2023-12-29 HISTORY — PX: HARDWARE REMOVAL: SHX979

## 2023-12-29 SURGERY — REMOVAL, HARDWARE
Anesthesia: General | Site: Wrist | Laterality: Right

## 2023-12-29 MED ORDER — LIDOCAINE HCL (PF) 2 % IJ SOLN
INTRAMUSCULAR | Status: AC
  Filled 2023-12-29: qty 5

## 2023-12-29 MED ORDER — ONDANSETRON HCL 4 MG/2ML IJ SOLN
INTRAMUSCULAR | Status: AC
  Filled 2023-12-29: qty 2

## 2023-12-29 MED ORDER — MORPHINE SULFATE (PF) 2 MG/ML IV SOLN: 0.5000 mg | INTRAVENOUS | Status: DC | PRN

## 2023-12-29 MED ORDER — HYDROCODONE-ACETAMINOPHEN 7.5-325 MG PO TABS: 1.0000 | ORAL_TABLET | ORAL | Status: DC | PRN

## 2023-12-29 MED ORDER — METOCLOPRAMIDE HCL 5 MG PO TABS: 5.0000 mg | ORAL_TABLET | Freq: Three times a day (TID) | ORAL | Status: DC | PRN

## 2023-12-29 MED ORDER — FAMOTIDINE 200 MG/20ML IV SOLN
INTRAVENOUS | Status: AC
  Filled 2023-12-29: qty 1

## 2023-12-29 MED ORDER — ONDANSETRON 4 MG PO TBDP: ORAL_TABLET | ORAL | 0 refills | Status: DC

## 2023-12-29 MED ORDER — 0.9 % SODIUM CHLORIDE (POUR BTL) OPTIME
TOPICAL | Status: DC | PRN
  Administered 2023-12-29: 120 mL

## 2023-12-29 MED ORDER — LIDOCAINE HCL (CARDIAC) PF 100 MG/5ML IV SOSY
PREFILLED_SYRINGE | INTRAVENOUS | Status: DC | PRN
  Administered 2023-12-29: 60 mg via INTRAVENOUS

## 2023-12-29 MED ORDER — MIDAZOLAM HCL 2 MG/2ML IJ SOLN
INTRAMUSCULAR | Status: AC
  Filled 2023-12-29: qty 2

## 2023-12-29 MED ORDER — ONDANSETRON HCL 4 MG PO TABS: 4.0000 mg | ORAL_TABLET | Freq: Four times a day (QID) | ORAL | Status: DC | PRN

## 2023-12-29 MED ORDER — EPHEDRINE SULFATE (PRESSORS) 50 MG/ML IJ SOLN
INTRAMUSCULAR | Status: DC | PRN
Start: 2023-12-29 — End: 2023-12-29
  Administered 2023-12-29: 5 mg via INTRAVENOUS

## 2023-12-29 MED ORDER — PROPOFOL 500 MG/50ML IV EMUL
INTRAVENOUS | Status: DC | PRN
  Administered 2023-12-29: 160 ug/kg/min via INTRAVENOUS

## 2023-12-29 MED ORDER — EPHEDRINE 5 MG/ML INJ
INTRAVENOUS | Status: AC
Start: 2023-12-29 — End: ?
  Filled 2023-12-29: qty 5

## 2023-12-29 MED ORDER — CEFAZOLIN SODIUM-DEXTROSE 2-3 GM-%(50ML) IV SOLR
INTRAVENOUS | Status: AC
  Filled 2023-12-29: qty 50

## 2023-12-29 MED ORDER — FENTANYL CITRATE (PF) 100 MCG/2ML IJ SOLN
INTRAMUSCULAR | Status: AC
  Filled 2023-12-29: qty 2

## 2023-12-29 MED ORDER — PROPOFOL 10 MG/ML IV BOLUS
INTRAVENOUS | Status: DC | PRN
  Administered 2023-12-29: 50 mg via INTRAVENOUS
  Administered 2023-12-29: 200 mg via INTRAVENOUS

## 2023-12-29 MED ORDER — SODIUM CHLORIDE 0.9% FLUSH: 3.0000 mL | INTRAVENOUS | Status: DC | PRN

## 2023-12-29 MED ORDER — ACETAMINOPHEN 325 MG PO TABS: 325.0000 mg | ORAL_TABLET | Freq: Four times a day (QID) | ORAL | Status: DC | PRN

## 2023-12-29 MED ORDER — MIDAZOLAM HCL 5 MG/5ML IJ SOLN
INTRAMUSCULAR | Status: DC | PRN
  Administered 2023-12-29: 2 mg via INTRAVENOUS

## 2023-12-29 MED ORDER — SODIUM CHLORIDE 0.9% FLUSH: 3.0000 mL | Freq: Two times a day (BID) | INTRAVENOUS | Status: DC

## 2023-12-29 MED ORDER — FAMOTIDINE IN NACL 20-0.9 MG/50ML-% IV SOLN
INTRAVENOUS | Status: DC | PRN
Start: 2023-12-29 — End: 2023-12-29
  Administered 2023-12-29: 20 mg via INTRAVENOUS

## 2023-12-29 MED ORDER — CEFAZOLIN SODIUM-DEXTROSE 2-4 GM/100ML-% IV SOLN
2.0000 g | INTRAVENOUS | Status: AC
  Administered 2023-12-29: 2 g via INTRAVENOUS

## 2023-12-29 MED ORDER — HYDROCODONE-ACETAMINOPHEN 5-325 MG PO TABS: 1.0000 | ORAL_TABLET | ORAL | Status: DC | PRN

## 2023-12-29 MED ORDER — ONDANSETRON HCL 4 MG/2ML IJ SOLN: 4.0000 mg | Freq: Four times a day (QID) | INTRAMUSCULAR | Status: DC | PRN

## 2023-12-29 MED ORDER — ONDANSETRON HCL 4 MG/2ML IJ SOLN
4.0000 mg | Freq: Once | INTRAMUSCULAR | Status: AC
  Administered 2023-12-29: 4 mg via INTRAVENOUS

## 2023-12-29 MED ORDER — PROPOFOL 10 MG/ML IV BOLUS
INTRAVENOUS | Status: AC
  Filled 2023-12-29: qty 20

## 2023-12-29 MED ORDER — FENTANYL CITRATE (PF) 100 MCG/2ML IJ SOLN
INTRAMUSCULAR | Status: DC | PRN
  Administered 2023-12-29: 50 ug via INTRAVENOUS

## 2023-12-29 MED ORDER — METOCLOPRAMIDE HCL 5 MG/ML IJ SOLN: 5.0000 mg | Freq: Three times a day (TID) | INTRAMUSCULAR | Status: DC | PRN

## 2023-12-29 MED ORDER — PROPOFOL 1000 MG/100ML IV EMUL
INTRAVENOUS | Status: AC
  Filled 2023-12-29: qty 100

## 2023-12-29 MED ORDER — BUPIVACAINE HCL 0.5 % IJ SOLN
INTRAMUSCULAR | Status: DC | PRN
Start: 2023-12-29 — End: 2023-12-29
  Administered 2023-12-29: 10 mL

## 2023-12-29 MED ORDER — LEVOFLOXACIN IN D5W 500 MG/100ML IV SOLN: 500.0000 mg | INTRAVENOUS | Status: DC

## 2023-12-29 MED ORDER — LACTATED RINGERS IV SOLN: INTRAVENOUS | Status: DC

## 2023-12-29 MED ORDER — SUCCINYLCHOLINE CHLORIDE 200 MG/10ML IV SOSY
PREFILLED_SYRINGE | INTRAVENOUS | Status: DC | PRN
  Administered 2023-12-29: 140 mg via INTRAVENOUS

## 2023-12-29 MED ORDER — HYDROCODONE-ACETAMINOPHEN 5-325 MG PO TABS: 1.0000 | ORAL_TABLET | Freq: Four times a day (QID) | ORAL | 0 refills | Status: DC | PRN

## 2023-12-29 MED ORDER — FENTANYL CITRATE PF 50 MCG/ML IJ SOSY
50.0000 ug | PREFILLED_SYRINGE | Freq: Once | INTRAMUSCULAR | Status: AC
  Administered 2023-12-29: 50 ug via INTRAVENOUS

## 2023-12-29 MED ORDER — SODIUM CHLORIDE 0.9 % IV SOLN: INTRAVENOUS | Status: DC | PRN

## 2023-12-29 SURGICAL SUPPLY — 19 items
BNDG ELASTIC 3X5.8 VLCR NS LF (GAUZE/BANDAGES/DRESSINGS) IMPLANT
CHLORAPREP W/TINT 26 (MISCELLANEOUS) ×1 IMPLANT
COVER LIGHT HANDLE UNIVERSAL (MISCELLANEOUS) ×2 IMPLANT
DRAPE FLUOR MINI C-ARM 54X84 (DRAPES) IMPLANT
ELECT REM PT RETURN 9FT ADLT (ELECTROSURGICAL) ×1 IMPLANT
ELECTRODE REM PT RTRN 9FT ADLT (ELECTROSURGICAL) ×1 IMPLANT
GAUZE SPONGE 4X4 12PLY STRL (GAUZE/BANDAGES/DRESSINGS) ×1 IMPLANT
GAUZE XEROFORM 1X8 LF (GAUZE/BANDAGES/DRESSINGS) ×1 IMPLANT
GLOVE SURG SYN 9.0 PF PI (GLOVE) ×1 IMPLANT
GOWN STRL REIN 2XL XLG LVL4 (GOWN DISPOSABLE) ×1 IMPLANT
GOWN STRL REUS W/ TWL LRG LVL3 (GOWN DISPOSABLE) ×1 IMPLANT
GRAFT DMB PUTTY 1 OPTIUM FD (Bone Implant) IMPLANT
KIT TURNOVER KIT A (KITS) ×1 IMPLANT
NS IRRIG 500ML POUR BTL (IV SOLUTION) ×1 IMPLANT
PACK EXTREMITY ARMC (MISCELLANEOUS) ×1 IMPLANT
PADDING CAST BLEND 3X4 STRL (MISCELLANEOUS) IMPLANT
PUTTY DBM OPTIUM 1CC (Bone Implant) ×1 IMPLANT
SUT ETHILON 4 0 FS (SUTURE) IMPLANT
SUT VIC AB 3-0 SH 27X BRD (SUTURE) IMPLANT

## 2023-12-29 NOTE — Anesthesia Procedure Notes (Addendum)
 Procedure Name: Intubation Date/Time: 12/29/2023 11:28 AM  Performed by: Barbette Hair, CRNAPre-anesthesia Checklist: Patient identified, Emergency Drugs available, Suction available, Patient being monitored and Timeout performed Patient Re-evaluated:Patient Re-evaluated prior to induction Oxygen Delivery Method: Circle system utilized Preoxygenation: Pre-oxygenation with 100% oxygen Induction Type: IV induction and Rapid sequence Laryngoscope Size: Mac and 4 Grade View: Grade I Tube type: Oral Number of attempts: 1 Airway Equipment and Method: Stylet Placement Confirmation: ETT inserted through vocal cords under direct vision, positive ETCO2, CO2 detector and breath sounds checked- equal and bilateral Secured at: 22 cm Tube secured with: Tape

## 2023-12-29 NOTE — H&P (Signed)
 Chief Complaint Patient presents with Right Wrist - Follow-up  Subjective  Heather Blake is a 63 y.o. female who presents for Follow-up of the Right Wrist HPI History of Present Illness The patient presents for a history and physical examination in preparation for right wrist hardware removal surgery.  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 The surgery was initially delayed because she contracted the flu approximately a week before the original date.  She experiences early post-traumatic arthritis in the right wrist, contributing to her symptoms. She can move her wrist with some limitations, with wrist extension to fifteen degrees and flexion to forty-five degrees. Pronation and supination are good, but discomfort occurs with backward movements. She hopes that removing the hardware will improve her ability to pick up things again.  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  Review of Systems  Patient Active Problem List Diagnosis Trochanteric bursitis of right hip Primary osteoarthritis of right knee Lumbar radiculitis Essential hypertension Pure hypercholesterolemia Chest pain with high risk for cardiac etiology Abnormal ECG SOB (shortness of breath) on exertion Preop cardiovascular exam Anemia Near syncope Sleep apnea Influenza A  Outpatient Medications Prior to Visit Medication Sig Dispense Refill amLODIPine (NORVASC) 10 MG tablet Take 10 mg by mouth once daily aspirin 81 MG EC tablet Take 81 mg by mouth BD LUER-LOK SYRINGE 3 mL 25 gauge x 1" Syrg busPIRone (BUSPAR) 10 MG tablet Take 10 mg by mouth 2 (two) times daily citalopram (CELEXA) 40 MG tablet Take 40 mg by mouth once daily. cyanocobalamin (VITAMIN B12) 1,000 mcg/mL  injection Inject into the muscle monthly dextrose (GLUCOSE ORAL) Take 1 tablet by mouth continuously as needed pantoprazole (PROTONIX) 40 MG DR tablet Take 40 mg by mouth once daily rosuvastatin (CRESTOR) 40 MG tablet Take 40 mg by mouth once daily Saccharomyces boulardii (FLORASTOR) 250 mg capsule Take 250 mg by mouth 2 (two) times daily spironolactone (ALDACTONE) 25 MG tablet Take 1 tablet by mouth once daily HYDROcodone-acetaminophen (NORCO) 5-325 mg tablet Take 1 tablet by mouth every 6 (six) hours as needed for Pain (Patient not taking: Reported on 12/24/2023) 35 tablet 0 ondansetron (ZOFRAN-ODT) 4 MG disintegrating tablet Take 1 tablet (4 mg total) by mouth every 8 (eight) hours as needed for Nausea or Vomiting (Patient not taking: Reported on 12/24/2023) 30 tablet 1  No facility-administered medications prior to visit.    Objective  Vitals: 12/24/23 1407 BP: 130/86 Weight: (!) 121.7 kg (268 lb 3.2 oz) Height: 170.2 cm (5\' 7" ) PainSc: 6 PainLoc: Wrist  Body mass index is 42.01 kg/m.  Home Vitals:   Physical Exam Physical Exam HEENT: Normal oropharynx, moist mucous membranes. CHEST: Clear to auscultation bilaterally. MUSCULOSKELETAL: Right wrist flexion 45 degrees, extension 15 degrees. SKIN: Port wine stain on hand.  Constitutional: alert, in NAD, and communicates well  Results    Assessment/Plan:  Assessment & Plan Right distal radius fracture with painful orthopaedic hardware She is scheduled for hardware removal surgery on March 18th due to painful orthopaedic hardware in the right wrist. Early post-traumatic arthritis is present. Hardware removal is expected to alleviate pain and improve function. She trusts the surgical plan and has no questions about the procedure. Proceed with hardware removal surgery on March 18th. Advise limited use of  the wrist for two weeks post-surgery until the skin heals. Allow resumption of activities as tolerated after two  weeks.  Early post-traumatic osteoarthritis, right distal radius Early post-traumatic osteoarthritis in the right distal radius likely contributes to wrist pain. Hardware removal may alleviate some symptoms associated with this condition. Diagnoses and all orders for this visit:  Painful orthopaedic hardware (CMS-HCC)  S/P ORIF (open reduction internal fixation) fracture  This visit was coded based on medical decision making (MDM).    Future Appointments  Date/Time Provider Department Center Visit Type 01/04/2024 1:15 PM Marlena Clipper, MD [facility] C POST-OP 01/15/2024 9:15 AM Patience Musca, PA [facility] C POST-OP    There are no Patient Instructions on file for this visit.  An after visit summary was provided for the patient either in written format (printed) or through MyChart.  This note has been created using automated tools and reviewed for accuracy by [facility].  Electronically signed by Marlena Clipper, MD at 12/25/2023 10:21 AM EDT

## 2023-12-29 NOTE — Transfer of Care (Signed)
 Immediate Anesthesia Transfer of Care Note  Patient: Heather Blake  Procedure(s) Performed: REMOVAL, HARDWARE (Right: Wrist)  Patient Location: PACU  Anesthesia Type: General  Level of Consciousness: awake, alert  and patient cooperative  Airway and Oxygen Therapy: Patient Spontanous Breathing and Patient connected to supplemental oxygen  Post-op Assessment: Post-op Vital signs reviewed, Patient's Cardiovascular Status Stable, Respiratory Function Stable, Patent Airway and No signs of Nausea or vomiting  Post-op Vital Signs: Reviewed and stable  Complications: No notable events documented.

## 2023-12-29 NOTE — Discharge Instructions (Signed)
 Keep arm elevated Work on finger range of motion Loosen Ace wrap if fingers swell Pain medicine and nausea medicine as directed Call office if you are having problems  [phone]

## 2023-12-29 NOTE — Op Note (Signed)
 12/29/2023  12:18 PM  PATIENT:  Heather Blake  63 y.o. female  PRE-OPERATIVE DIAGNOSIS:  Closed fracture of right wrist with routine healing, subsequent encounter S62.101D Painful orthopaedic hardware T84.84XA  POST-OPERATIVE DIAGNOSIS:  Painful hardware  PROCEDURE:  Procedure(s) with comments: REMOVAL, HARDWARE (Right) - Right wrist DVR plate removal 3 screws and 7 pegs  SURGEON: Leitha Schuller, MD  ASSISTANTS: None  ANESTHESIA:   general  EBL:  Total I/O In: 610 [I.V.:500; IV Piggyback:110] Out: 2 [Blood:2]  BLOOD ADMINISTERED:none  DRAINS: none   LOCAL MEDICATIONS USED:  MARCAINE    and Amount: 10 ml  SPECIMEN:  No Specimen  DISPOSITION OF SPECIMEN:  N/A  COUNTS:  YES  TOURNIQUET:   Total Tourniquet Time Documented: Upper Arm (Right) - 23 minutes Total: Upper Arm (Right) - 23 minutes   IMPLANTS: 0.5 cc DBM  DICTATION: .Dragon Dictation patient was brought to the operating room and after placing the patient under general anesthesia the right arm was prepped and draped in the usual sterile manner with a tourniquet applied the upper arm.  After patient identification and timeout procedures were completed tourniquet was raised to 250 mmHg.  Going through most of the prior incision but not the entire length skin and subcutaneous tissue was incised with care being taken not to damage radial artery.  The FCR tendon sheath was identified and the tendon retracted radially.  Deep tissue was separated and the scar tissue was not too dense until getting down to the plate which had extensive scarring.  After exposure of the entire plate all screws were removed without difficulty and all distal pegs.  The plate was then removed under fluoroscopic exam there was still a slight gap in the radial styloid fracture and so additional DBM was packed into that but the fracture appeared stable on range of motion.  Following this the wound was infiltrated with 10 cc of half percent  Sensorcaine and wound closed with 3-0 Vicryl subcutaneously 4-0 nylon after letting tourniquet down.  Dressing of Xeroform 4 x 4 web roll and Ace wrap applied  PLAN OF CARE: Discharge to home after PACU  PATIENT DISPOSITION:  PACU - hemodynamically stable.

## 2023-12-30 NOTE — Anesthesia Postprocedure Evaluation (Signed)
 Anesthesia Post Note  Patient: Heather Blake  Procedure(s) Performed: REMOVAL, HARDWARE (Right: Wrist)  Patient location during evaluation: PACU Anesthesia Type: General Level of consciousness: awake and alert Pain management: pain level controlled Vital Signs Assessment: post-procedure vital signs reviewed and stable Respiratory status: spontaneous breathing, nonlabored ventilation, respiratory function stable and patient connected to nasal cannula oxygen Cardiovascular status: blood pressure returned to baseline and stable Postop Assessment: no apparent nausea or vomiting Anesthetic complications: no   No notable events documented.   Last Vitals:  Vitals:   12/29/23 1258 12/29/23 1300  BP: 126/70   Pulse: 76 79  Resp: (!) 22 19  Temp: (!) 36.1 C   SpO2: 95% 94%    Last Pain:  Vitals:   12/29/23 1258  TempSrc:   PainSc: 3                  Caliah Kopke C Shamell Suarez

## 2023-12-31 ENCOUNTER — Encounter: Payer: Self-pay | Admitting: Orthopedic Surgery

## 2024-01-19 ENCOUNTER — Encounter: Payer: Self-pay | Admitting: Internal Medicine

## 2024-01-19 DIAGNOSIS — G4730 Sleep apnea, unspecified: Secondary | ICD-10-CM

## 2024-01-21 NOTE — Telephone Encounter (Signed)
 Order for CPAP placed in quick sign folder.

## 2024-01-21 NOTE — Telephone Encounter (Signed)
 Signed and placed in box.

## 2024-02-11 ENCOUNTER — Encounter: Payer: Self-pay | Admitting: Internal Medicine

## 2024-02-11 DIAGNOSIS — E538 Deficiency of other specified B group vitamins: Secondary | ICD-10-CM

## 2024-02-12 NOTE — Telephone Encounter (Signed)
 I see her next week. She has labs scheduled. Please add a B12 level to be drawn with her labs and then I can let her know what to take for her B12 supplement.

## 2024-02-15 ENCOUNTER — Other Ambulatory Visit (INDEPENDENT_AMBULATORY_CARE_PROVIDER_SITE_OTHER): Payer: Managed Care, Other (non HMO)

## 2024-02-15 DIAGNOSIS — E538 Deficiency of other specified B group vitamins: Secondary | ICD-10-CM

## 2024-02-15 DIAGNOSIS — I10 Essential (primary) hypertension: Secondary | ICD-10-CM | POA: Diagnosis not present

## 2024-02-15 DIAGNOSIS — E7800 Pure hypercholesterolemia, unspecified: Secondary | ICD-10-CM

## 2024-02-15 DIAGNOSIS — R739 Hyperglycemia, unspecified: Secondary | ICD-10-CM

## 2024-02-15 LAB — HEPATIC FUNCTION PANEL
ALT: 24 U/L (ref 0–35)
AST: 29 U/L (ref 0–37)
Albumin: 4.8 g/dL (ref 3.5–5.2)
Alkaline Phosphatase: 57 U/L (ref 39–117)
Bilirubin, Direct: 0.1 mg/dL (ref 0.0–0.3)
Total Bilirubin: 0.6 mg/dL (ref 0.2–1.2)
Total Protein: 8 g/dL (ref 6.0–8.3)

## 2024-02-15 LAB — BASIC METABOLIC PANEL WITH GFR
BUN: 10 mg/dL (ref 6–23)
CO2: 24 meq/L (ref 19–32)
Calcium: 9.9 mg/dL (ref 8.4–10.5)
Chloride: 103 meq/L (ref 96–112)
Creatinine, Ser: 0.7 mg/dL (ref 0.40–1.20)
GFR: 91.07 mL/min (ref >60.00)
Glucose, Bld: 117 mg/dL — ABNORMAL HIGH (ref 70–99)
Potassium: 4.2 meq/L (ref 3.5–5.1)
Sodium: 139 meq/L (ref 135–145)

## 2024-02-15 LAB — VITAMIN B12: Vitamin B-12: 1096 pg/mL — ABNORMAL HIGH (ref 211–911)

## 2024-02-15 LAB — LIPID PANEL
Cholesterol: 131 mg/dL (ref 0–200)
HDL: 56.3 mg/dL (ref >39.00)
LDL Cholesterol: 54 mg/dL (ref 0–99)
NonHDL: 74.99
Total CHOL/HDL Ratio: 2
Triglycerides: 105 mg/dL (ref 0.0–149.0)
VLDL: 21 mg/dL (ref 0.0–40.0)

## 2024-02-15 LAB — HEMOGLOBIN A1C: Hgb A1c MFr Bld: 6.1 % (ref 4.6–6.5)

## 2024-02-15 LAB — TSH: TSH: 1.17 u[IU]/mL (ref 0.35–5.50)

## 2024-02-17 ENCOUNTER — Ambulatory Visit (INDEPENDENT_AMBULATORY_CARE_PROVIDER_SITE_OTHER): Payer: Managed Care, Other (non HMO) | Admitting: Internal Medicine

## 2024-02-17 ENCOUNTER — Encounter: Payer: Self-pay | Admitting: Internal Medicine

## 2024-02-17 VITALS — BP 130/70 | HR 83 | Temp 98.0°F | Resp 16 | Ht 67.0 in | Wt 269.4 lb

## 2024-02-17 DIAGNOSIS — F419 Anxiety disorder, unspecified: Secondary | ICD-10-CM | POA: Diagnosis not present

## 2024-02-17 DIAGNOSIS — E7800 Pure hypercholesterolemia, unspecified: Secondary | ICD-10-CM | POA: Diagnosis not present

## 2024-02-17 DIAGNOSIS — M25539 Pain in unspecified wrist: Secondary | ICD-10-CM | POA: Insufficient documentation

## 2024-02-17 DIAGNOSIS — M25531 Pain in right wrist: Secondary | ICD-10-CM

## 2024-02-17 DIAGNOSIS — K219 Gastro-esophageal reflux disease without esophagitis: Secondary | ICD-10-CM

## 2024-02-17 DIAGNOSIS — I10 Essential (primary) hypertension: Secondary | ICD-10-CM

## 2024-02-17 DIAGNOSIS — R739 Hyperglycemia, unspecified: Secondary | ICD-10-CM

## 2024-02-17 DIAGNOSIS — G4730 Sleep apnea, unspecified: Secondary | ICD-10-CM

## 2024-02-17 NOTE — Assessment & Plan Note (Signed)
 If takes her protonix  regularly, upper symptoms controlled. Continue PPI.

## 2024-02-17 NOTE — Assessment & Plan Note (Signed)
Using cpap regularly.  Doing well.  

## 2024-02-17 NOTE — Progress Notes (Signed)
 Subjective:    [name], female    DOB: Jan 08, 1961, 63 y.o.   MRN: [id number]  Patient here for  Chief Complaint  Patient presents with   Medical Management of Chronic Issues    HPI Here for a scheduled follow up - follow up regarding hypercholesterolemia, hypertension and increased stress. Continues on citalopram . Saw ortho 02/10/24 - f/u right wrist pain. S/p removal of hardware. 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 Recommended a wrist sleeve and using voltaren gel. Previously saw cardiology. Recommended echo and coronary CT. Normal coronary CTA. No evidence of CAD. ECHO - normal ejection fraction and mild MR. F/u with cardiology 06/26/23 - stable.Using cpap - OSA. Overall doing relatively well. Handling stress. No chest pain or sob reported. No abdominal pain or bowel change. Acid reflux controlled on PPI.    Past Medical History:  Diagnosis Date   Allergy    Anemia    Cancer (HCC)    Depression    Diverticulosis    FHx: migraine headaches    GERD (gastroesophageal reflux disease)    History of chickenpox    Hypercholesterolemia    Hyperlipidemia    Hypertension    Morbid obesity with BMI of 40.0-44.9, adult (HCC)    Motion sickness    ocean boats   OSA on CPAP    Osteoarthritis    Panic attacks    Sleep apnea    CPAP   Vertigo    none for over 10 yrs   Past Surgical History:  Procedure Laterality Date   ABDOMINAL HYSTERECTOMY     CATARACT EXTRACTION W/PHACO Left 08/11/2017   Procedure: CATARACT EXTRACTION PHACO AND INTRAOCULAR LENS PLACEMENT (IOC);  Surgeon: Clair Crews, MD;  Location: [facility];  Service: Ophthalmology;  Laterality: Left;  US  00:31 AP% 19.2 CDE 6.13 Fluid Pack lot # [id number]   CATARACT EXTRACTION W/PHACO Right 09/01/2017   Procedure: CATARACT EXTRACTION PHACO AND INTRAOCULAR LENS PLACEMENT (IOC);  Surgeon: Clair Crews, MD;  Location: [facility];  Service: Ophthalmology;  Laterality: Right;  US   00:30 AP% 11.7 CDE3.52 fluid pack lot #[id number]   Child Birth  1981 and 1979   COLONOSCOPY     COLONOSCOPY WITH PROPOFOL  N/A 08/25/2018   Procedure: COLONOSCOPY WITH PROPOFOL ;  Surgeon: Toledo, Alphonsus Jeans, MD;  Location: [facility];  Service: Gastroenterology;  Laterality: N/A;   ESOPHAGOGASTRODUODENOSCOPY     ESOPHAGOGASTRODUODENOSCOPY (EGD) WITH PROPOFOL  N/A 08/25/2018   Procedure: ESOPHAGOGASTRODUODENOSCOPY (EGD) WITH PROPOFOL ;  Surgeon: Toledo, Alphonsus Jeans, MD;  Location: [facility];  Service: Gastroenterology;  Laterality: N/A;   FRACTURE SURGERY     HARDWARE REMOVAL Right 12/29/2023   Procedure: REMOVAL, HARDWARE;  Surgeon: Molli Angelucci, MD;  Location: [facility];  Service: Orthopedics;  Laterality: Right;  Right wrist DVR plate removal 3 screws and 7 pegs   LAPAROSCOPIC SUPRACERVICAL HYSTERECTOMY  2006   ovaries left in place   left elbow     left knee     Miscarriage  1987   OPEN REDUCTION INTERNAL FIXATION (ORIF) DISTAL RADIAL FRACTURE Right 08/28/2023   Procedure: Open reduction & internal fixation of right distal radius and right carpal tunnel release;  Surgeon: Molli Angelucci, MD;  Location: [facility];  Service: Orthopedics;  Laterality: Right;   TONSILLECTOMY  1963   TUBAL LIGATION  1993   Family History  Problem Relation Age of Onset   Heart disease Mother        Incomplete heart block  Hypertension Mother    Diabetes Mother    Nephrolithiasis Mother    Migraines Mother    Arthritis Mother        degenerative-back,osteoporosis   Heart failure Mother    Heart disease Father 25       Myocardial infarction   Heart failure Father    Heart disease Sister        h/o MI   Migraines Sister    Breast cancer Neg Hx    Social History   Socioeconomic History   Marital status: Married    Spouse name: Not on file   Number of children: Not on file   Years of education: Not on file   Highest education level: Not on file  Occupational History    Not on file  Tobacco Use   Smoking status: Former    Current packs/day: 0.00    Average packs/day: 0.1 packs/day for 2.5 years (0.3 ttl pk-yrs)    Types: Cigarettes    Start date: 04/1994    Quit date: 10/13/1996    Years since quitting: 27.3   Smokeless tobacco: Never  Vaping Use   Vaping status: Never Used  Substance and Sexual Activity   Alcohol use: No   Drug use: No   Sexual activity: Not on file  Other Topics Concern   Not on file  Social History Narrative   Not on file   Social Drivers of Health   Financial Resource Strain: Medium Risk (09/14/2023)   Received from [facility]   Overall Financial Resource Strain (CARDIA)    Difficulty of Paying Living Expenses: Somewhat hard  Food Insecurity: Food Insecurity Present (09/14/2023)   Received from [facility]   Hunger Vital Sign    Worried About Running Out of Food in the Last Year: Often true    Ran Out of Food in the Last Year: Often true  Transportation Needs: No Transportation Needs (09/14/2023)   Received from [facility]    In the past 12 months, has lack of transportation kept you from medical appointments or from getting medications?: No    Lack of Transportation (Non-Medical): No  Physical Activity: Not on file  Stress: Not on file  Social Connections: Not on file     Review of Systems  Constitutional:  Negative for appetite change and unexpected weight change.  HENT:  Negative for congestion and sinus pressure.   Respiratory:  Negative for cough, chest tightness and shortness of breath.   Cardiovascular:  Negative for chest pain, palpitations and leg swelling.  Gastrointestinal:  Negative for abdominal pain, diarrhea, nausea and vomiting.  Genitourinary:  Negative for difficulty urinating and dysuria.  Musculoskeletal:  Negative for myalgias.       Persistent wrist pain as outlined.   Skin:  Negative for color change and rash.   Neurological:  Negative for dizziness and headaches.  Psychiatric/Behavioral:  Negative for agitation and dysphoric mood.        Objective:     BP 130/70   Pulse 83   Temp 98 F (36.7 C)   Resp 16   Ht 5\' 7"  (1.702 m)   Wt 269 lb 6.4 oz (122.2 kg)   SpO2 98%   BMI 42.19 kg/m  Wt Readings from Last 3 Encounters:  02/17/24 269 lb 6.4 oz (122.2 kg)  12/29/23 267 lb (121.1 kg)  12/13/23 264 lb (119.7 kg)    Physical Exam Vitals reviewed.  Constitutional:      General: She is not in acute distress.    Appearance: Normal appearance.  HENT:     Head: Normocephalic and atraumatic.     Right Ear: External ear normal.     Left Ear: External ear normal.     Mouth/Throat:     Pharynx: No oropharyngeal exudate or posterior oropharyngeal erythema.  Eyes:     General: No scleral icterus.       Right eye: No discharge.        Left eye: No discharge.     Conjunctiva/sclera: Conjunctivae normal.  Neck:     Thyroid : No thyromegaly.  Cardiovascular:     Rate and Rhythm: Normal rate and regular rhythm.  Pulmonary:     Effort: No respiratory distress.     Breath sounds: Normal breath sounds. No wheezing.  Abdominal:     General: Bowel sounds are normal.     Palpations: Abdomen is soft.     Tenderness: There is no abdominal tenderness.  Musculoskeletal:        General: No swelling or tenderness.     Cervical back: Neck supple. No tenderness.  Lymphadenopathy:     Cervical: No cervical adenopathy.  Skin:    Findings: No erythema or rash.  Neurological:     Mental Status: She is alert.  Psychiatric:        Mood and Affect: Mood normal.        Behavior: Behavior normal.         Outpatient Encounter Medications as of 02/17/2024  Medication Sig   acetaminophen  (TYLENOL ) 650 MG CR tablet Take 650 mg by mouth every 8 (eight) hours as needed for pain.   amLODipine  (NORVASC ) 10 MG tablet Take 1 tablet (10 mg total) by mouth daily.   Blood Glucose Monitoring Suppl DEVI Use as  directed to check blood sugars twice daily.   busPIRone  (BUSPAR ) 5 MG tablet Take 1 tablet (5 mg total) by mouth 2 (two) times daily.   citalopram  (CELEXA ) 40 MG tablet Take 1 tablet (40 mg total) by mouth daily.   cyanocobalamin  (VITAMIN B12) 1000 MCG/ML injection Inject 1 mL (1,000 mcg total) into the muscle every 30 (thirty) days.   Fructose-Dextrose -Phosphor Acd (SB ANTI-NAUSEA PO) Take by mouth.   Glucose Blood (BLOOD GLUCOSE TEST STRIPS) STRP Use as directed to check blood sugars twice daily.   HYDROcodone -acetaminophen  (NORCO/VICODIN) 5-325 MG tablet Take 1-2 tablets by mouth every 6 (six) hours as needed for moderate pain (pain score 4-6).   Lancets Misc. MISC Use as directed to check blood sugars twice daily.   NEEDLE, DISP, 25 G (B-D DISP NEEDLE 25GX1") 25G X 1" MISC Used to give monthly B-12 injections   ondansetron  (ZOFRAN -ODT) 4 MG disintegrating tablet Allow 1-2 tablets to dissolve in your mouth every 8 hours as needed for nausea/vomiting   pantoprazole  (PROTONIX ) 40 MG tablet Take 40 mg by mouth daily.   rosuvastatin  (CRESTOR ) 40 MG tablet Take 1 tablet (40 mg total) by mouth daily.   spironolactone  (ALDACTONE ) 25 MG tablet Take 1 tablet (25 mg total) by mouth daily.   SYRINGE-NEEDLE, DISP, 3 ML (B-D 3CC LUER-LOK SYR 25GX1") 25G X 1" 3 ML MISC TO GIVE MONTHLY B-12 INJECTIONS   [DISCONTINUED] docusate sodium  (COLACE) 100 MG capsule Take 1 tablet once or twice daily as needed for constipation while taking narcotic pain medicine (Patient not taking: Reported on 12/24/2023)   No facility-administered encounter medications on file as of 02/17/2024.  Lab Results  Component Value Date   WBC 8.1 08/14/2023   HGB 13.8 08/14/2023   HCT 42.0 08/14/2023   PLT 242.0 08/14/2023   GLUCOSE 117 (H) 02/15/2024   CHOL 131 02/15/2024   TRIG 105.0 02/15/2024   HDL 56.30 02/15/2024   LDLDIRECT 73.0 12/26/2022   LDLCALC 54 02/15/2024   ALT 24 02/15/2024   AST 29 02/15/2024   NA 139  02/15/2024   K 4.2 02/15/2024   CL 103 02/15/2024   CREATININE 0.70 02/15/2024   BUN 10 02/15/2024   CO2 24 02/15/2024   TSH 1.17 02/15/2024   HGBA1C 6.1 02/15/2024    DG MINI C-ARM IMAGE ONLY Result Date: 12/29/2023 There is no interpretation for this exam.  This order is for images obtained during a surgical procedure.  Please See "Surgeries" Tab for more information regarding the procedure.       Assessment & Plan:  Anxiety Assessment & Plan: Increased stress.  Discussed.  Overall doing better.  Continue citalopram . Stable. Follow.    Hypercholesterolemia Assessment & Plan: On crestor .  Low-cholesterol diet and exercise.  Follow lipid panel and liver function tests.  Lab Results  Component Value Date   CHOL 131 02/15/2024   HDL 56.30 02/15/2024   LDLCALC 54 02/15/2024   LDLDIRECT 73.0 12/26/2022   TRIG 105.0 02/15/2024   CHOLHDL 2 02/15/2024    Orders: -     Lipid panel; Future -     Hepatic function panel; Future  Hyperglycemia Assessment & Plan: Low carb diet and exercise.  Follow met b and A1c.  Lab Results  Component Value Date   HGBA1C 6.1 02/15/2024    Orders: -     Hemoglobin A1c; Future  Essential hypertension, benign Assessment & Plan: Continue aldactone .  Follow pressures.  Follow metabolic panel.  No changes in medication. Recheck GFR wnl.   Orders: -     Basic metabolic panel with GFR; Future  Sleep apnea, unspecified type Assessment & Plan: Using cpap regularly. Doing well.    Gastroesophageal reflux disease, unspecified whether esophagitis present Assessment & Plan: If takes her protonix  regularly, upper symptoms controlled. Continue PPI.    Right wrist pain Assessment & Plan: S/p hardware removal. Continue f/u with surgery.       Dellar Fenton, MD

## 2024-02-17 NOTE — Assessment & Plan Note (Signed)
 Continue aldactone .  Follow pressures.  Follow metabolic panel.  No changes in medication. Recheck GFR wnl.

## 2024-02-17 NOTE — Assessment & Plan Note (Signed)
 On crestor .  Low-cholesterol diet and exercise.  Follow lipid panel and liver function tests.  Lab Results  Component Value Date   CHOL 131 02/15/2024   HDL 56.30 02/15/2024   LDLCALC 54 02/15/2024   LDLDIRECT 73.0 12/26/2022   TRIG 105.0 02/15/2024   CHOLHDL 2 02/15/2024

## 2024-02-17 NOTE — Assessment & Plan Note (Signed)
 Increased stress.  Discussed.  Overall doing better.  Continue citalopram . Stable. Follow.

## 2024-02-17 NOTE — Patient Instructions (Signed)
 Stop B12 injections. Start oral B12 1000mcg per day.

## 2024-02-17 NOTE — Assessment & Plan Note (Signed)
 Low carb diet and exercise. Follow met b and A1c.   Lab Results  Component Value Date   HGBA1C 6.1 02/15/2024

## 2024-02-17 NOTE — Assessment & Plan Note (Signed)
 S/p hardware removal. Continue f/u with surgery.

## 2024-03-01 ENCOUNTER — Ambulatory Visit: Payer: Managed Care, Other (non HMO) | Admitting: Dermatology

## 2024-04-16 ENCOUNTER — Encounter: Payer: Self-pay | Admitting: Internal Medicine

## 2024-04-18 MED ORDER — BLOOD GLUCOSE TEST VI STRP: ORAL_STRIP | 11 refills | Status: DC

## 2024-04-18 MED ORDER — LANCETS MISC. MISC
11 refills | Status: DC
Start: 2024-04-18 — End: 2024-04-19

## 2024-04-18 MED ORDER — LANCETS MISC. MISC
11 refills | Status: DC
Start: 2024-04-18 — End: 2024-04-18

## 2024-04-19 MED ORDER — BLOOD GLUCOSE TEST VI STRP: ORAL_STRIP | 11 refills | Status: DC

## 2024-04-19 MED ORDER — LANCETS MISC. MISC: 11 refills | Status: AC

## 2024-04-19 NOTE — Addendum Note (Signed)
 Addended by: HARRIETTE RAISIN on: 04/19/2024 08:53 AM   Modules accepted: Orders

## 2024-04-19 NOTE — Telephone Encounter (Signed)
 Rx sent for test strips - to check sugar daily.

## 2024-04-19 NOTE — Addendum Note (Signed)
 Addended by: GLENDIA ALLENA RAMAN on: 04/19/2024 08:15 PM   Modules accepted: Orders

## 2024-04-25 ENCOUNTER — Other Ambulatory Visit: Payer: Self-pay

## 2024-04-25 NOTE — Telephone Encounter (Signed)
 She does not have a dx of diabetes but I did see an A1C from 2023 of 6.4. Can we use diabetes as a dx ?

## 2024-04-25 NOTE — Telephone Encounter (Signed)
 If 6.4, will have to use pre diabetes diagnosis.

## 2024-04-28 ENCOUNTER — Other Ambulatory Visit: Payer: Self-pay

## 2024-04-28 DIAGNOSIS — R7303 Prediabetes: Secondary | ICD-10-CM

## 2024-04-28 MED ORDER — BLOOD GLUCOSE TEST VI STRP: ORAL_STRIP | 11 refills | Status: DC

## 2024-04-29 ENCOUNTER — Other Ambulatory Visit: Payer: Self-pay

## 2024-04-29 DIAGNOSIS — R7303 Prediabetes: Secondary | ICD-10-CM

## 2024-04-29 MED ORDER — BLOOD GLUCOSE TEST VI STRP: ORAL_STRIP | 11 refills | Status: DC

## 2024-05-05 ENCOUNTER — Other Ambulatory Visit: Payer: Self-pay | Admitting: Internal Medicine

## 2024-05-05 DIAGNOSIS — R7303 Prediabetes: Secondary | ICD-10-CM

## 2024-05-05 DIAGNOSIS — R739 Hyperglycemia, unspecified: Secondary | ICD-10-CM

## 2024-05-06 NOTE — Telephone Encounter (Signed)
 Rx for test strilps sent in with hyperglycemia diagonsis

## 2024-05-07 ENCOUNTER — Other Ambulatory Visit: Payer: Self-pay | Admitting: Internal Medicine

## 2024-05-10 ENCOUNTER — Other Ambulatory Visit: Payer: Self-pay | Admitting: Internal Medicine

## 2024-05-11 ENCOUNTER — Other Ambulatory Visit: Payer: Self-pay | Admitting: Internal Medicine

## 2024-05-11 DIAGNOSIS — R7303 Prediabetes: Secondary | ICD-10-CM

## 2024-05-11 DIAGNOSIS — R739 Hyperglycemia, unspecified: Secondary | ICD-10-CM

## 2024-05-20 ENCOUNTER — Other Ambulatory Visit

## 2024-05-20 DIAGNOSIS — R739 Hyperglycemia, unspecified: Secondary | ICD-10-CM

## 2024-05-20 DIAGNOSIS — E7800 Pure hypercholesterolemia, unspecified: Secondary | ICD-10-CM

## 2024-05-20 DIAGNOSIS — I10 Essential (primary) hypertension: Secondary | ICD-10-CM

## 2024-05-20 LAB — LIPID PANEL
Cholesterol: 138 mg/dL (ref 0–200)
HDL: 59 mg/dL (ref >39.00)
LDL Cholesterol: 57 mg/dL (ref 0–99)
NonHDL: 78.76
Total CHOL/HDL Ratio: 2
Triglycerides: 107 mg/dL (ref 0.0–149.0)
VLDL: 21.4 mg/dL (ref 0.0–40.0)

## 2024-05-20 LAB — BASIC METABOLIC PANEL WITH GFR
BUN: 12 mg/dL (ref 6–23)
CO2: 29 meq/L (ref 19–32)
Calcium: 10 mg/dL (ref 8.4–10.5)
Chloride: 101 meq/L (ref 96–112)
Creatinine, Ser: 0.58 mg/dL (ref 0.40–1.20)
GFR: 95.12 mL/min (ref >60.00)
Glucose, Bld: 102 mg/dL — ABNORMAL HIGH (ref 70–99)
Potassium: 4.4 meq/L (ref 3.5–5.1)
Sodium: 140 meq/L (ref 135–145)

## 2024-05-20 LAB — HEPATIC FUNCTION PANEL
ALT: 20 U/L (ref 0–35)
AST: 20 U/L (ref 0–37)
Albumin: 4.6 g/dL (ref 3.5–5.2)
Alkaline Phosphatase: 50 U/L (ref 39–117)
Bilirubin, Direct: 0.2 mg/dL (ref 0.0–0.3)
Total Bilirubin: 0.6 mg/dL (ref 0.2–1.2)
Total Protein: 7.3 g/dL (ref 6.0–8.3)

## 2024-05-20 LAB — HEMOGLOBIN A1C: Hgb A1c MFr Bld: 6 % (ref 4.6–6.5)

## 2024-05-21 ENCOUNTER — Ambulatory Visit: Payer: Self-pay | Admitting: Internal Medicine

## 2024-05-24 ENCOUNTER — Encounter: Payer: Self-pay | Admitting: Internal Medicine

## 2024-05-24 ENCOUNTER — Ambulatory Visit (INDEPENDENT_AMBULATORY_CARE_PROVIDER_SITE_OTHER): Admitting: Internal Medicine

## 2024-05-24 VITALS — BP 120/84 | HR 82 | Temp 97.9°F | Ht 67.0 in | Wt 269.8 lb

## 2024-05-24 DIAGNOSIS — E7800 Pure hypercholesterolemia, unspecified: Secondary | ICD-10-CM

## 2024-05-24 DIAGNOSIS — M25512 Pain in left shoulder: Secondary | ICD-10-CM | POA: Insufficient documentation

## 2024-05-24 DIAGNOSIS — G4730 Sleep apnea, unspecified: Secondary | ICD-10-CM

## 2024-05-24 DIAGNOSIS — R739 Hyperglycemia, unspecified: Secondary | ICD-10-CM

## 2024-05-24 DIAGNOSIS — I10 Essential (primary) hypertension: Secondary | ICD-10-CM | POA: Diagnosis not present

## 2024-05-24 DIAGNOSIS — E2839 Other primary ovarian failure: Secondary | ICD-10-CM | POA: Diagnosis not present

## 2024-05-24 DIAGNOSIS — K219 Gastro-esophageal reflux disease without esophagitis: Secondary | ICD-10-CM

## 2024-05-24 DIAGNOSIS — Z0000 Encounter for general adult medical examination without abnormal findings: Secondary | ICD-10-CM

## 2024-05-24 DIAGNOSIS — Z124 Encounter for screening for malignant neoplasm of cervix: Secondary | ICD-10-CM

## 2024-05-24 DIAGNOSIS — F419 Anxiety disorder, unspecified: Secondary | ICD-10-CM

## 2024-05-24 NOTE — Assessment & Plan Note (Signed)
 On crestor .  Low-cholesterol diet and exercise.  Follow lipid panel and liver function tests. No change today in medication.  Lab Results  Component Value Date   CHOL 138 05/20/2024   HDL 59.00 05/20/2024   LDLCALC 57 05/20/2024   LDLDIRECT 73.0 12/26/2022   TRIG 107.0 05/20/2024   CHOLHDL 2 05/20/2024

## 2024-05-24 NOTE — Progress Notes (Signed)
 Subjective:    [name], female    DOB: 09-20-1961, 63 y.o.   MRN: [id number]  Patient here for  Chief Complaint  Patient presents with   Medical Management of Chronic Issues    Follow up     HPI With past history of hypercholesterolemia, hypertension and increased stress. Comes in today to follow up on these issues as well as for a complete physical exam. Continues on citalopram .  Previously saw cardiology. Recommended echo and coronary CT. Normal coronary CTA. No evidence of CAD. ECHO - normal ejection fraction and mild MR. F/u with cardiology 06/26/23 - stable.Using cpap - OSA. Had f/u with ortho 03/23/24 - s/p ORIF right wrist. Stable. She is having left shoulder pain. Increased pain with abduction - especially at or above 90 degrees. No chest pain. Breathing stable. No abdominal pain or bowel change.    Past Medical History:  Diagnosis Date   Allergy    Anemia    Cancer (HCC)    Depression    Diverticulosis    FHx: migraine headaches    GERD (gastroesophageal reflux disease)    History of chickenpox    Hypercholesterolemia    Hyperlipidemia    Hypertension    Morbid obesity with BMI of 40.0-44.9, adult (HCC)    Motion sickness    ocean boats   OSA on CPAP    Osteoarthritis    Panic attacks    Sleep apnea    CPAP   Vertigo    none for over 10 yrs   Past Surgical History:  Procedure Laterality Date   ABDOMINAL HYSTERECTOMY     CATARACT EXTRACTION W/PHACO Left 08/11/2017   Procedure: CATARACT EXTRACTION PHACO AND INTRAOCULAR LENS PLACEMENT (IOC);  Surgeon: Jaye Fallow, MD;  Location: [facility];  Service: Ophthalmology;  Laterality: Left;  US  00:31 AP% 19.2 CDE 6.13 Fluid Pack lot # [id number]   CATARACT EXTRACTION W/PHACO Right 09/01/2017   Procedure: CATARACT EXTRACTION PHACO AND INTRAOCULAR LENS PLACEMENT (IOC);  Surgeon: Jaye Fallow, MD;  Location: [facility];  Service: Ophthalmology;  Laterality: Right;  US  00:30 AP%  11.7 CDE3.52 fluid pack lot #[id number]   Child Birth  1983 and 1981   COLONOSCOPY     COLONOSCOPY WITH PROPOFOL  N/A 08/25/2018   Procedure: COLONOSCOPY WITH PROPOFOL ;  Surgeon: Toledo, Ladell POUR, MD;  Location: [facility];  Service: Gastroenterology;  Laterality: N/A;   ESOPHAGOGASTRODUODENOSCOPY     ESOPHAGOGASTRODUODENOSCOPY (EGD) WITH PROPOFOL  N/A 08/25/2018   Procedure: ESOPHAGOGASTRODUODENOSCOPY (EGD) WITH PROPOFOL ;  Surgeon: Toledo, Ladell POUR, MD;  Location: [facility];  Service: Gastroenterology;  Laterality: N/A;   FRACTURE SURGERY     HARDWARE REMOVAL Right 12/29/2023   Procedure: REMOVAL, HARDWARE;  Surgeon: Kathlynn Sharper, MD;  Location: [facility];  Service: Orthopedics;  Laterality: Right;  Right wrist DVR plate removal 3 screws and 7 pegs   LAPAROSCOPIC SUPRACERVICAL HYSTERECTOMY  2006   ovaries left in place   left elbow     left knee     Miscarriage  1987   OPEN REDUCTION INTERNAL FIXATION (ORIF) DISTAL RADIAL FRACTURE Right 08/28/2023   Procedure: Open reduction & internal fixation of right distal radius and right carpal tunnel release;  Surgeon: Kathlynn Sharper, MD;  Location: [facility];  Service: Orthopedics;  Laterality: Right;   TONSILLECTOMY  1963   TUBAL LIGATION  1993   Family History  Problem Relation Age of Onset   Heart disease Mother  Incomplete heart block   Hypertension Mother    Diabetes Mother    Nephrolithiasis Mother    Migraines Mother    Arthritis Mother        degenerative-back,osteoporosis   Heart failure Mother    Heart disease Father 62       Myocardial infarction   Heart failure Father    Heart disease Sister        h/o MI   Migraines Sister    Breast cancer Neg Hx    Social History   Socioeconomic History   Marital status: Married    Spouse name: Not on file   Number of children: Not on file   Years of education: Not on file   Highest education level: Not on file  Occupational History   Not on  file  Tobacco Use   Smoking status: Former    Current packs/day: 0.00    Average packs/day: 0.1 packs/day for 2.5 years (0.3 ttl pk-yrs)    Types: Cigarettes    Start date: 04/1994    Quit date: 10/13/1996    Years since quitting: 27.6   Smokeless tobacco: Never  Vaping Use   Vaping status: Never Used  Substance and Sexual Activity   Alcohol use: No   Drug use: No   Sexual activity: Not on file  Other Topics Concern   Not on file  Social History Narrative   Not on file   Social Drivers of Health   Financial Resource Strain: Medium Risk (09/14/2023)   Received from [facility]   Overall Financial Resource Strain (CARDIA)    Difficulty of Paying Living Expenses: Somewhat hard  Food Insecurity: Food Insecurity Present (09/14/2023)   Received from [facility]   Hunger Vital Sign    Within the past 12 months, you worried that your food would run out before you got the money to buy more.: Often true    Within the past 12 months, the food you bought just didn't last and you didn't have money to get more.: Often true  Transportation Needs: No Transportation Needs (09/14/2023)   Received from [facility] - Transportation    In the past 12 months, has lack of transportation kept you from medical appointments or from getting medications?: No    Lack of Transportation (Non-Medical): No  Physical Activity: Not on file  Stress: Not on file  Social Connections: Not on file     Review of Systems  Constitutional:  Negative for appetite change and unexpected weight change.  HENT:  Negative for congestion, sinus pressure and sore throat.   Eyes:  Negative for pain and visual disturbance.  Respiratory:  Negative for cough, chest tightness and shortness of breath.   Cardiovascular:  Negative for chest pain, palpitations and leg swelling.  Gastrointestinal:  Negative for abdominal pain, diarrhea, nausea and vomiting.   Genitourinary:  Negative for difficulty urinating and dysuria.  Musculoskeletal:  Negative for joint swelling and myalgias.       Left shoulder pain. Present for months. Increased pain with abduction of her arm.   Skin:  Negative for color change and rash.  Neurological:  Negative for dizziness and headaches.  Hematological:  Negative for adenopathy. Does not bruise/bleed easily.  Psychiatric/Behavioral:  Negative for agitation and dysphoric mood.        Objective:     BP 120/84 (BP Location: Left Arm, Patient Position: Sitting, Cuff Size: Normal)   Pulse 82  Temp 97.9 F (36.6 C) (Oral)   Ht 5' 7 (1.702 m)   Wt 269 lb 12.8 oz (122.4 kg)   SpO2 96%   BMI 42.26 kg/m  Wt Readings from Last 3 Encounters:  05/24/24 269 lb 12.8 oz (122.4 kg)  02/17/24 269 lb 6.4 oz (122.2 kg)  12/29/23 267 lb (121.1 kg)    Physical Exam Vitals reviewed.  Constitutional:      General: She is not in acute distress.    Appearance: Normal appearance. She is well-developed.  HENT:     Head: Normocephalic and atraumatic.     Right Ear: External ear normal.     Left Ear: External ear normal.     Mouth/Throat:     Pharynx: No oropharyngeal exudate or posterior oropharyngeal erythema.  Eyes:     General: No scleral icterus.       Right eye: No discharge.        Left eye: No discharge.     Conjunctiva/sclera: Conjunctivae normal.  Neck:     Thyroid : No thyromegaly.  Cardiovascular:     Rate and Rhythm: Normal rate and regular rhythm.  Pulmonary:     Effort: No tachypnea, accessory muscle usage or respiratory distress.     Breath sounds: Normal breath sounds. No decreased breath sounds or wheezing.  Chest:  Breasts:    Right: No inverted nipple, mass, nipple discharge or tenderness (no axillary adenopathy).     Left: No inverted nipple, mass, nipple discharge or tenderness (no axilarry adenopathy).  Abdominal:     General: Bowel sounds are normal.     Palpations: Abdomen is soft.      Tenderness: There is no abdominal tenderness.  Musculoskeletal:        General: No swelling.     Cervical back: Neck supple.     Comments: Increased pain - abduction - at or above 90 degrees - left arm. Increased pain to palpation - left shoulder.- anterior   Lymphadenopathy:     Cervical: No cervical adenopathy.  Skin:    Findings: No erythema or rash.  Neurological:     Mental Status: She is alert and oriented to person, place, and time.  Psychiatric:        Mood and Affect: Mood normal.        Behavior: Behavior normal.         Outpatient Encounter Medications as of 05/24/2024  Medication Sig   acetaminophen  (TYLENOL ) 650 MG CR tablet Take 650 mg by mouth every 8 (eight) hours as needed for pain.   amLODipine  (NORVASC ) 10 MG tablet Take 1 tablet (10 mg total) by mouth daily.   Blood Glucose Monitoring Suppl DEVI Use as directed to check blood sugars twice daily.   busPIRone  (BUSPAR ) 5 MG tablet TAKE 1 TABLET BY MOUTH TWICE DAILY   citalopram  (CELEXA ) 40 MG tablet Take 1 tablet (40 mg total) by mouth daily.   glucose blood (FREESTYLE LITE) test strip USE  STRIP TO CHECK GLUCOSE ONCE DAILY   Lancets Misc. MISC Use as directed to check blood sugars twice daily.   ondansetron  (ZOFRAN -ODT) 4 MG disintegrating tablet Allow 1-2 tablets to dissolve in your mouth every 8 hours as needed for nausea/vomiting   pantoprazole  (PROTONIX ) 40 MG tablet TAKE 1 TABLET BY MOUTH DAILY   rosuvastatin  (CRESTOR ) 40 MG tablet Take 1 tablet (40 mg total) by mouth daily.   spironolactone  (ALDACTONE ) 25 MG tablet Take 1 tablet (25 mg total) by mouth daily.   [  DISCONTINUED] cyanocobalamin  (VITAMIN B12) 1000 MCG/ML injection Inject 1 mL (1,000 mcg total) into the muscle every 30 (thirty) days.   [DISCONTINUED] Fructose-Dextrose -Phosphor Acd (SB ANTI-NAUSEA PO) Take by mouth.   [DISCONTINUED] HYDROcodone -acetaminophen  (NORCO/VICODIN) 5-325 MG tablet Take 1-2 tablets by mouth every 6 (six) hours as needed for  moderate pain (pain score 4-6).   [DISCONTINUED] NEEDLE, DISP, 25 G (B-D DISP NEEDLE 25GX1) 25G X 1 MISC Used to give monthly B-12 injections   [DISCONTINUED] SYRINGE-NEEDLE, DISP, 3 ML (B-D 3CC LUER-LOK SYR 25GX1) 25G X 1 3 ML MISC TO GIVE MONTHLY B-12 INJECTIONS   No facility-administered encounter medications on file as of 05/24/2024.     Lab Results  Component Value Date   WBC 8.1 08/14/2023   HGB 13.8 08/14/2023   HCT 42.0 08/14/2023   PLT 242.0 08/14/2023   GLUCOSE 102 (H) 05/20/2024   CHOL 138 05/20/2024   TRIG 107.0 05/20/2024   HDL 59.00 05/20/2024   LDLDIRECT 73.0 12/26/2022   LDLCALC 57 05/20/2024   ALT 20 05/20/2024   AST 20 05/20/2024   NA 140 05/20/2024   K 4.4 05/20/2024   CL 101 05/20/2024   CREATININE 0.58 05/20/2024   BUN 12 05/20/2024   CO2 29 05/20/2024   TSH 1.17 02/15/2024   HGBA1C 6.0 05/20/2024    DG MINI C-ARM IMAGE ONLY Result Date: 12/29/2023 There is no interpretation for this exam.  This order is for images obtained during a surgical procedure.  Please See Surgeries Tab for more information regarding the procedure.       Assessment & Plan:  Healthcare maintenance Assessment & Plan: Physical today 05/24/24.  Mammogram 09/17/23 - Birads I.  Colonoscopy 08/2018 - internal hemorrhoids.  F/u in 10 years.     Hypercholesterolemia Assessment & Plan: On crestor .  Low-cholesterol diet and exercise.  Follow lipid panel and liver function tests. No change today in medication.  Lab Results  Component Value Date   CHOL 138 05/20/2024   HDL 59.00 05/20/2024   LDLCALC 57 05/20/2024   LDLDIRECT 73.0 12/26/2022   TRIG 107.0 05/20/2024   CHOLHDL 2 05/20/2024    Orders: -     Lipid panel; Future -     Hepatic function panel; Future  Cervical cancer screening  Estrogen deficiency -     DG Bone Density; Future  Essential hypertension, benign Assessment & Plan: Continue aldactone . Blood pressure as outlined. No change in medication. Follow  metabolic panel.   Orders: -     CBC with Differential/Platelet; Future -     Basic metabolic panel with GFR; Future  Hyperglycemia Assessment & Plan: Low carb diet and exercise. Follow met b and A1c.  Lab Results  Component Value Date   HGBA1C 6.0 05/20/2024     Orders: -     Hemoglobin A1c; Future  Left shoulder pain, unspecified chronicity Assessment & Plan: Persistent pain - left shoulder as outlined. Exam as outlined. Refer to PT for evaluation and treatment.   Orders: -     Ambulatory referral to Physical Therapy  Sleep apnea, unspecified type Assessment & Plan: Continue cpap.    Gastroesophageal reflux disease, unspecified whether esophagitis present Assessment & Plan: No upper symptoms - on protonix .    Anxiety Assessment & Plan: Increased stress with her husband's health issues. Daughter just recently came and stayed with her for a few weeks. Doing better currently. Follow. Continue citalopram .       Allena Hamilton, MD

## 2024-05-24 NOTE — Assessment & Plan Note (Signed)
 Physical today 05/24/24.  Mammogram 09/17/23 - Birads I.  Colonoscopy 08/2018 - internal hemorrhoids.  F/u in 10 years.

## 2024-05-29 NOTE — Assessment & Plan Note (Signed)
No upper symptoms on protonix.

## 2024-05-29 NOTE — Assessment & Plan Note (Signed)
 Persistent pain - left shoulder as outlined. Exam as outlined. Refer to PT for evaluation and treatment.

## 2024-05-29 NOTE — Assessment & Plan Note (Signed)
 Increased stress with her husband's health issues. Daughter just recently came and stayed with her for a few weeks. Doing better currently. Follow. Continue citalopram .

## 2024-05-29 NOTE — Assessment & Plan Note (Signed)
 Continue aldactone . Blood pressure as outlined. No change in medication. Follow metabolic panel.

## 2024-05-29 NOTE — Assessment & Plan Note (Signed)
 Continue cpap.

## 2024-05-29 NOTE — Assessment & Plan Note (Signed)
 Low carb diet and exercise. Follow met b and A1c.  Lab Results  Component Value Date   HGBA1C 6.0 05/20/2024

## 2024-06-27 ENCOUNTER — Ambulatory Visit
Admission: RE | Admit: 2024-06-27 | Discharge: 2024-06-27 | Disposition: A | Discharge status: Home / Self Care | Source: Ambulatory Visit | Attending: Internal Medicine | Admitting: Internal Medicine

## 2024-06-27 ENCOUNTER — Ambulatory Visit: Payer: Self-pay | Admitting: Internal Medicine

## 2024-06-27 DIAGNOSIS — E2839 Other primary ovarian failure: Secondary | ICD-10-CM | POA: Diagnosis present

## 2024-06-28 NOTE — Telephone Encounter (Signed)
 Patient returned call from Almarie Hummer, CMA.  I read Dr. Allena Scott's message to patient and scheduled an appointment for patient to meet with Dr. Glendia on 07/08/2024 to discuss treatment options.

## 2024-07-08 ENCOUNTER — Ambulatory Visit (INDEPENDENT_AMBULATORY_CARE_PROVIDER_SITE_OTHER): Admitting: Internal Medicine

## 2024-07-08 ENCOUNTER — Encounter: Payer: Self-pay | Admitting: Internal Medicine

## 2024-07-08 VITALS — BP 130/82 | HR 82 | Temp 97.9°F | Ht 67.0 in | Wt 272.2 lb

## 2024-07-08 DIAGNOSIS — F419 Anxiety disorder, unspecified: Secondary | ICD-10-CM

## 2024-07-08 DIAGNOSIS — M8580 Other specified disorders of bone density and structure, unspecified site: Secondary | ICD-10-CM | POA: Diagnosis not present

## 2024-07-08 DIAGNOSIS — E7800 Pure hypercholesterolemia, unspecified: Secondary | ICD-10-CM | POA: Diagnosis not present

## 2024-07-08 DIAGNOSIS — R739 Hyperglycemia, unspecified: Secondary | ICD-10-CM

## 2024-07-08 DIAGNOSIS — I10 Essential (primary) hypertension: Secondary | ICD-10-CM | POA: Diagnosis not present

## 2024-07-08 MED ORDER — PANTOPRAZOLE SODIUM 40 MG PO TBEC: 40.0000 mg | DELAYED_RELEASE_TABLET | Freq: Every day | ORAL | 1 refills | Status: DC

## 2024-07-08 MED ORDER — ALENDRONATE SODIUM 70 MG PO TABS: 70.0000 mg | ORAL_TABLET | ORAL | 3 refills | Status: AC

## 2024-07-08 MED ORDER — BUSPIRONE HCL 5 MG PO TABS: 5.0000 mg | ORAL_TABLET | Freq: Two times a day (BID) | ORAL | 1 refills | Status: DC

## 2024-07-08 MED ORDER — CITALOPRAM HYDROBROMIDE 40 MG PO TABS: 40.0000 mg | ORAL_TABLET | Freq: Every day | ORAL | 1 refills | Status: DC

## 2024-07-08 NOTE — Patient Instructions (Signed)
 Calcium  (oscal or caltrate) one tablet twice a day  Vitamin D3 1000 units - take one per day.

## 2024-07-08 NOTE — Progress Notes (Signed)
 Subjective:    [name], female    DOB: 30-Apr-1961, 63 y.o.   MRN: [id number]  Patient here for  Chief Complaint  Patient presents with   Results   Medical Management of Chronic Issues    HPI Here for a scheduled follow up - follow up to discussed bone density and treatment options.  Had f/u with ortho 03/23/24 - s/p ORIF right wrist. Stable. Bone density 06/27/24 - revealed osteopenia. Frax 10 year probability of fracture - Major osteoporotic fracture 22.2%. hip fracture .8%. T score -1.3 lumbar spine and left femoral neck -1.0. discussed treatment options. Discussed oral bisphosphonates, IV reclast and prolia. Discussed calcium  and vitamin d and weight bearing exercise. Also discussed increased stress. She feels she is handling things relatively well. Does not feel needs further intervention. Breathing stable.    Past Medical History:  Diagnosis Date   Allergy    Anemia    Cancer (HCC)    Depression    Diverticulosis    FHx: migraine headaches    GERD (gastroesophageal reflux disease)    History of chickenpox    Hypercholesterolemia    Hyperlipidemia    Hypertension    Morbid obesity with BMI of 40.0-44.9, adult (HCC)    Motion sickness    ocean boats   OSA on CPAP    Osteoarthritis    Panic attacks    Sleep apnea    CPAP   Vertigo    none for over 10 yrs   Past Surgical History:  Procedure Laterality Date   ABDOMINAL HYSTERECTOMY     CATARACT EXTRACTION W/PHACO Left 08/11/2017   Procedure: CATARACT EXTRACTION PHACO AND INTRAOCULAR LENS PLACEMENT (IOC);  Surgeon: Jaye Fallow, MD;  Location: [facility];  Service: Ophthalmology;  Laterality: Left;  US  00:31 AP% 19.2 CDE 6.13 Fluid Pack lot # [id number]   CATARACT EXTRACTION W/PHACO Right 09/01/2017   Procedure: CATARACT EXTRACTION PHACO AND INTRAOCULAR LENS PLACEMENT (IOC);  Surgeon: Jaye Fallow, MD;  Location: [facility];  Service: Ophthalmology;  Laterality: Right;  US  00:30 AP%  11.7 CDE3.52 fluid pack lot #[id number]   Child Birth  1983 and 1979   COLONOSCOPY     COLONOSCOPY WITH PROPOFOL  N/A 08/25/2018   Procedure: COLONOSCOPY WITH PROPOFOL ;  Surgeon: Toledo, Ladell POUR, MD;  Location: [facility];  Service: Gastroenterology;  Laterality: N/A;   ESOPHAGOGASTRODUODENOSCOPY     ESOPHAGOGASTRODUODENOSCOPY (EGD) WITH PROPOFOL  N/A 08/25/2018   Procedure: ESOPHAGOGASTRODUODENOSCOPY (EGD) WITH PROPOFOL ;  Surgeon: Toledo, Ladell POUR, MD;  Location: [facility];  Service: Gastroenterology;  Laterality: N/A;   FRACTURE SURGERY     HARDWARE REMOVAL Right 12/29/2023   Procedure: REMOVAL, HARDWARE;  Surgeon: Kathlynn Sharper, MD;  Location: [facility];  Service: Orthopedics;  Laterality: Right;  Right wrist DVR plate removal 3 screws and 7 pegs   LAPAROSCOPIC SUPRACERVICAL HYSTERECTOMY  2006   ovaries left in place   left elbow     left knee     Miscarriage  1987   OPEN REDUCTION INTERNAL FIXATION (ORIF) DISTAL RADIAL FRACTURE Right 08/28/2023   Procedure: Open reduction & internal fixation of right distal radius and right carpal tunnel release;  Surgeon: Kathlynn Sharper, MD;  Location: [facility];  Service: Orthopedics;  Laterality: Right;   TONSILLECTOMY  1963   TUBAL LIGATION  1993   Family History  Problem Relation Age of Onset   Heart disease Mother        Incomplete heart block   Hypertension Mother  Diabetes Mother    Nephrolithiasis Mother    Migraines Mother    Arthritis Mother        degenerative-back,osteoporosis   Heart failure Mother    Heart disease Father 2       Myocardial infarction   Heart failure Father    Heart disease Sister        h/o MI   Migraines Sister    Breast cancer Neg Hx    Social History   Socioeconomic History   Marital status: Married    Spouse name: Not on file   Number of children: Not on file   Years of education: Not on file   Highest education level: Not on file  Occupational History   Not on  file  Tobacco Use   Smoking status: Former    Current packs/day: 0.00    Average packs/day: 0.1 packs/day for 2.5 years (0.3 ttl pk-yrs)    Types: Cigarettes    Start date: 04/1994    Quit date: 10/13/1996    Years since quitting: 27.7   Smokeless tobacco: Never  Vaping Use   Vaping status: Never Used  Substance and Sexual Activity   Alcohol use: No   Drug use: No   Sexual activity: Not on file  Other Topics Concern   Not on file  Social History Narrative   Not on file   Social Drivers of Health   Financial Resource Strain: Medium Risk (09/14/2023)   Received from [facility]   Overall Financial Resource Strain (CARDIA)    Difficulty of Paying Living Expenses: Somewhat hard  Food Insecurity: Food Insecurity Present (09/14/2023)   Received from [facility]   Hunger Vital Sign    Within the past 12 months, you worried that your food would run out before you got the money to buy more.: Often true    Within the past 12 months, the food you bought just didn't last and you didn't have money to get more.: Often true  Transportation Needs: No Transportation Needs (09/14/2023)   Received from [facility] - Transportation    In the past 12 months, has lack of transportation kept you from medical appointments or from getting medications?: No    Lack of Transportation (Non-Medical): No  Physical Activity: Not on file  Stress: Not on file  Social Connections: Not on file     Review of Systems  Constitutional:  Negative for appetite change and unexpected weight change.  HENT:  Negative for congestion and sinus pressure.   Respiratory:  Negative for cough, chest tightness and shortness of breath.   Cardiovascular:  Negative for chest pain, palpitations and leg swelling.  Gastrointestinal:  Negative for abdominal pain, diarrhea, nausea and vomiting.  Genitourinary:  Negative for difficulty urinating and dysuria.   Musculoskeletal:  Negative for joint swelling and myalgias.  Skin:  Negative for color change and rash.  Neurological:  Negative for dizziness and headaches.  Psychiatric/Behavioral:  Negative for agitation and dysphoric mood.        Objective:     BP 130/82   Pulse 82   Temp 97.9 F (36.6 C) (Oral)   Ht 5' 7 (1.702 m)   Wt 272 lb 3.2 oz (123.5 kg)   SpO2 98%   BMI 42.63 kg/m  Wt Readings from Last 3 Encounters:  07/08/24 272 lb 3.2 oz (123.5 kg)  05/24/24 269 lb 12.8 oz (122.4 kg)  02/17/24 269 lb 6.4  oz (122.2 kg)    Physical Exam Vitals reviewed.  Constitutional:      General: She is not in acute distress.    Appearance: Normal appearance.  HENT:     Head: Normocephalic and atraumatic.     Right Ear: External ear normal.     Left Ear: External ear normal.     Mouth/Throat:     Pharynx: No oropharyngeal exudate or posterior oropharyngeal erythema.  Eyes:     General: No scleral icterus.       Right eye: No discharge.        Left eye: No discharge.     Conjunctiva/sclera: Conjunctivae normal.  Neck:     Thyroid : No thyromegaly.  Cardiovascular:     Rate and Rhythm: Normal rate and regular rhythm.  Pulmonary:     Effort: No respiratory distress.     Breath sounds: Normal breath sounds. No wheezing.  Abdominal:     General: Bowel sounds are normal.     Palpations: Abdomen is soft.     Tenderness: There is no abdominal tenderness.  Musculoskeletal:        General: No swelling or tenderness.     Cervical back: Neck supple. No tenderness.  Lymphadenopathy:     Cervical: No cervical adenopathy.  Skin:    Findings: No erythema or rash.  Neurological:     Mental Status: She is alert.  Psychiatric:        Mood and Affect: Mood normal.        Behavior: Behavior normal.         Outpatient Encounter Medications as of 07/08/2024  Medication Sig   acetaminophen  (TYLENOL ) 650 MG CR tablet Take 650 mg by mouth every 8 (eight) hours as needed for pain.    alendronate  (FOSAMAX ) 70 MG tablet Take 1 tablet (70 mg total) by mouth every 7 (seven) days. Take with a full glass of water on an empty stomach.   amLODipine  (NORVASC ) 10 MG tablet Take 1 tablet (10 mg total) by mouth daily.   Blood Glucose Monitoring Suppl DEVI Use as directed to check blood sugars twice daily.   glucose blood (FREESTYLE LITE) test strip USE  STRIP TO CHECK GLUCOSE ONCE DAILY   Lancets Misc. MISC Use as directed to check blood sugars twice daily.   rosuvastatin  (CRESTOR ) 40 MG tablet Take 1 tablet (40 mg total) by mouth daily.   spironolactone  (ALDACTONE ) 25 MG tablet Take 1 tablet (25 mg total) by mouth daily.   busPIRone  (BUSPAR ) 5 MG tablet Take 1 tablet (5 mg total) by mouth 2 (two) times daily.   citalopram  (CELEXA ) 40 MG tablet Take 1 tablet (40 mg total) by mouth daily.   pantoprazole  (PROTONIX ) 40 MG tablet Take 1 tablet (40 mg total) by mouth daily.   [DISCONTINUED] busPIRone  (BUSPAR ) 5 MG tablet TAKE 1 TABLET BY MOUTH TWICE DAILY   [DISCONTINUED] citalopram  (CELEXA ) 40 MG tablet Take 1 tablet (40 mg total) by mouth daily.   [DISCONTINUED] pantoprazole  (PROTONIX ) 40 MG tablet TAKE 1 TABLET BY MOUTH DAILY   No facility-administered encounter medications on file as of 07/08/2024.     Lab Results  Component Value Date   WBC 8.1 08/14/2023   HGB 13.8 08/14/2023   HCT 42.0 08/14/2023   PLT 242.0 08/14/2023   GLUCOSE 102 (H) 05/20/2024   CHOL 138 05/20/2024   TRIG 107.0 05/20/2024   HDL 59.00 05/20/2024   LDLDIRECT 73.0 12/26/2022   LDLCALC 57 05/20/2024   ALT  20 05/20/2024   AST 20 05/20/2024   NA 140 05/20/2024   K 4.4 05/20/2024   CL 101 05/20/2024   CREATININE 0.58 05/20/2024   BUN 12 05/20/2024   CO2 29 05/20/2024   TSH 1.17 02/15/2024   HGBA1C 6.0 05/20/2024    DG Bone Density Result Date: 06/27/2024 EXAM: DUAL X-RAY ABSORPTIOMETRY (DXA) FOR BONE MINERAL DENSITY 06/27/2024 9:40 am CLINICAL DATA:  63 year old Female Postmenopausal. Estrogen  deficiency History of fragility fracture. TECHNIQUE: An axial (e.g., hips, spine) and/or appendicular (e.g., radius) exam was performed, as appropriate, using GE Secretary/administrator at [facility]. Images are obtained for bone mineral density measurement and are not obtained for diagnostic purposes. [id number] Exclusions: None. COMPARISON:  None. FINDINGS: Scan quality: Good. LUMBAR SPINE (L1-L4): BMD (in g/cm2): 1.031 T-score: -1.3 Z-score: 0.3 LEFT FEMORAL NECK: BMD (in g/cm2): 0.899 T-score: -1.0 Z-score: 0.5 LEFT TOTAL HIP: BMD (in g/cm2): 0.966 T-score: -0.3 Z-score: 0.9 RIGHT FEMORAL NECK: BMD (in g/cm2): 0.933 T-score: -0.8 Z-score: 0.7 RIGHT TOTAL HIP: BMD (in g/cm2): 0.933 T-score: -0.6 Z-score: 0.6 FRAX 10-YEAR PROBABILITY OF FRACTURE: 10-year fracture risk is performed using the [employer] FRAX calculator based on patient-reported risk factors. Major osteoporotic fracture: 22.2% Hip fracture: 0.8% Other situations known to alter the reliability of the FRAX score should be considered when making treatment decisions, including chronic glucocorticoid use and past treatments. Further guidance on treatment can be found at the [facility]'s website https://www.patton.com/. IMPRESSION: Osteopenia based on BMD. Fracture risk is increased. Increased risk is based on low BMD, FRAX calculation and history of fragility fracture. RECOMMENDATIONS: 1. All patients should optimize calcium  and vitamin D intake. 2. Consider FDA-approved medical therapies in postmenopausal women and men aged 74 years and older, based on the following: - A hip or vertebral (clinical or morphometric) fracture - T-score less than or equal to -2.5 and secondary causes have been excluded. - Low bone mass (T-score between -1.0 and -2.5) and a 10-year probability of a hip fracture greater than or equal to 3% or a 10-year probability of a major osteoporosis-related fracture greater than or equal to  20% based on the [place] -adapted WHO algorithm. - Clinician judgment and/or patient preferences may indicate treatment for people with 10-year fracture probabilities above or below these levels 3. Patients with diagnosis of osteoporosis or at high risk for fracture should have regular bone mineral density tests. For patients eligible for Medicare, routine testing is allowed once every 2 years. The testing frequency can be increased to one year for patients who have rapidly progressing disease, those who are receiving or discontinuing medical therapy to restore bone mass, or have additional risk factors. Electronically Signed   By: Dina  Arceo M.D.   On: 06/27/2024 12:41       Assessment & Plan:  Osteopenia, unspecified location Assessment & Plan: Bone density as outlined. Frax wrist discussed. Tscores discussed. Also discussed treatment options. Will continue calcium , vitamin D and weight bearing exercise. Hold on prescription medication at this time. Follow.    Hyperglycemia Assessment & Plan: Low carb diet and exercise.  Lab Results  Component Value Date   HGBA1C 6.0 05/20/2024      Hypercholesterolemia Assessment & Plan: On crestor .  Low-cholesterol diet and exercise.  Lab Results  Component Value Date   CHOL 138 05/20/2024   HDL 59.00 05/20/2024   LDLCALC 57 05/20/2024   LDLDIRECT 73.0 12/26/2022   TRIG 107.0 05/20/2024   CHOLHDL 2 05/20/2024  Essential hypertension, benign Assessment & Plan: Continue aldactone . Blood pressure as outlined. No change in medication today.    Anxiety Assessment & Plan: Increased stress with her husband's health issues. Continue citalopram . Does not feel needs further intervention. Follow.    Other orders -     busPIRone  HCl; Take 1 tablet (5 mg total) by mouth 2 (two) times daily.  Dispense: 180 tablet; Refill: 1 -     Citalopram  Hydrobromide; Take 1 tablet (40 mg total) by mouth daily.  Dispense: 90 tablet; Refill: 1 -     Pantoprazole   Sodium; Take 1 tablet (40 mg total) by mouth daily.  Dispense: 90 tablet; Refill: 1 -     Alendronate  Sodium; Take 1 tablet (70 mg total) by mouth every 7 (seven) days. Take with a full glass of water on an empty stomach.  Dispense: 12 tablet; Refill: 3     Allena Hamilton, MD

## 2024-07-09 ENCOUNTER — Encounter: Payer: Self-pay | Admitting: Internal Medicine

## 2024-07-11 ENCOUNTER — Encounter: Payer: Self-pay | Admitting: Internal Medicine

## 2024-07-11 DIAGNOSIS — M8580 Other specified disorders of bone density and structure, unspecified site: Secondary | ICD-10-CM | POA: Insufficient documentation

## 2024-07-11 NOTE — Assessment & Plan Note (Signed)
 Low carb diet and exercise.  Lab Results  Component Value Date   HGBA1C 6.0 05/20/2024

## 2024-07-11 NOTE — Assessment & Plan Note (Signed)
 Increased stress with her husband's health issues. Continue citalopram . Does not feel needs further intervention. Follow.

## 2024-07-11 NOTE — Assessment & Plan Note (Signed)
 Continue aldactone . Blood pressure as outlined. No change in medication today.

## 2024-07-11 NOTE — Assessment & Plan Note (Signed)
 On crestor .  Low-cholesterol diet and exercise.  Lab Results  Component Value Date   CHOL 138 05/20/2024   HDL 59.00 05/20/2024   LDLCALC 57 05/20/2024   LDLDIRECT 73.0 12/26/2022   TRIG 107.0 05/20/2024   CHOLHDL 2 05/20/2024

## 2024-07-11 NOTE — Assessment & Plan Note (Signed)
 Bone density as outlined. Frax wrist discussed. Tscores discussed. Also discussed treatment options. Will continue calcium , vitamin D and weight bearing exercise. Hold on prescription medication at this time. Follow.

## 2024-07-18 ENCOUNTER — Encounter: Payer: Self-pay | Admitting: Internal Medicine

## 2024-08-09 ENCOUNTER — Other Ambulatory Visit: Payer: Self-pay | Admitting: Internal Medicine

## 2024-08-09 ENCOUNTER — Encounter: Payer: Self-pay | Admitting: Internal Medicine

## 2024-08-09 DIAGNOSIS — Z1231 Encounter for screening mammogram for malignant neoplasm of breast: Secondary | ICD-10-CM

## 2024-08-26 ENCOUNTER — Encounter: Payer: Self-pay | Admitting: Internal Medicine

## 2024-08-26 ENCOUNTER — Ambulatory Visit (INDEPENDENT_AMBULATORY_CARE_PROVIDER_SITE_OTHER): Admitting: Internal Medicine

## 2024-08-26 VITALS — BP 130/82 | HR 78 | Temp 98.9°F | Ht 67.0 in | Wt 273.0 lb

## 2024-08-26 DIAGNOSIS — G4730 Sleep apnea, unspecified: Secondary | ICD-10-CM

## 2024-08-26 DIAGNOSIS — R739 Hyperglycemia, unspecified: Secondary | ICD-10-CM

## 2024-08-26 DIAGNOSIS — E7800 Pure hypercholesterolemia, unspecified: Secondary | ICD-10-CM

## 2024-08-26 DIAGNOSIS — Z0000 Encounter for general adult medical examination without abnormal findings: Secondary | ICD-10-CM | POA: Diagnosis not present

## 2024-08-26 DIAGNOSIS — I10 Essential (primary) hypertension: Secondary | ICD-10-CM

## 2024-08-26 NOTE — Progress Notes (Signed)
 Chief Complaint  Patient presents with   welcome to medicare     Subjective:   Heather Blake is a 63 y.o. female who presents for a Welcome to Medicare Exam.   Allergies (verified) Aspartame, Celebrex [celecoxib], Imitrex [sumatriptan], Keflex  [cephalexin ], Nsaids, Paxil [paroxetine hcl], Percocet [oxycodone -acetaminophen ], Rofecoxib, Sulfa antibiotics, and Tylenol  [acetaminophen ]   History: Past Medical History:  Diagnosis Date   Allergy    Anemia    Cancer (HCC)    Depression    Diverticulosis    FHx: migraine headaches    GERD (gastroesophageal reflux disease)    History of chickenpox    Hypercholesterolemia    Hyperlipidemia    Hypertension    Morbid obesity with BMI of 40.0-44.9, adult (HCC)    Motion sickness    ocean boats   OSA on CPAP    Osteoarthritis    Panic attacks    Sleep apnea    CPAP   Vertigo    none for over 10 yrs   Past Surgical History:  Procedure Laterality Date   ABDOMINAL HYSTERECTOMY     CATARACT EXTRACTION W/PHACO Left 08/11/2017   Procedure: CATARACT EXTRACTION PHACO AND INTRAOCULAR LENS PLACEMENT (IOC);  Surgeon: Jaye Fallow, MD;  Location: [facility];  Service: Ophthalmology;  Laterality: Left;  US  00:31 AP% 19.2 CDE 6.13 Fluid Pack lot # [id number]   CATARACT EXTRACTION W/PHACO Right 09/01/2017   Procedure: CATARACT EXTRACTION PHACO AND INTRAOCULAR LENS PLACEMENT (IOC);  Surgeon: Jaye Fallow, MD;  Location: [facility];  Service: Ophthalmology;  Laterality: Right;  US  00:30 AP% 11.7 CDE3.52 fluid pack lot #[id number]   Child Birth  1983 and 1981   COLONOSCOPY     COLONOSCOPY WITH PROPOFOL  N/A 08/25/2018   Procedure: COLONOSCOPY WITH PROPOFOL ;  Surgeon: Toledo, Ladell POUR, MD;  Location: [facility];  Service: Gastroenterology;  Laterality: N/A;   ESOPHAGOGASTRODUODENOSCOPY     ESOPHAGOGASTRODUODENOSCOPY (EGD) WITH PROPOFOL  N/A 08/25/2018   Procedure: ESOPHAGOGASTRODUODENOSCOPY (EGD) WITH PROPOFOL ;  Surgeon: Toledo,  Ladell POUR, MD;  Location: [facility];  Service: Gastroenterology;  Laterality: N/A;   FRACTURE SURGERY     HARDWARE REMOVAL Right 12/29/2023   Procedure: REMOVAL, HARDWARE;  Surgeon: Kathlynn Sharper, MD;  Location: [facility];  Service: Orthopedics;  Laterality: Right;  Right wrist DVR plate removal 3 screws and 7 pegs   LAPAROSCOPIC SUPRACERVICAL HYSTERECTOMY  2006   ovaries left in place   left elbow     left knee     Miscarriage  1987   OPEN REDUCTION INTERNAL FIXATION (ORIF) DISTAL RADIAL FRACTURE Right 08/28/2023   Procedure: Open reduction & internal fixation of right distal radius and right carpal tunnel release;  Surgeon: Kathlynn Sharper, MD;  Location: [facility];  Service: Orthopedics;  Laterality: Right;   TONSILLECTOMY  1963   TUBAL LIGATION  1993   Family History  Problem Relation Age of Onset   Heart disease Mother        Incomplete heart block   Hypertension Mother    Diabetes Mother    Nephrolithiasis Mother    Migraines Mother    Arthritis Mother        degenerative-back,osteoporosis   Heart failure Mother    Heart disease Father 29       Myocardial infarction   Heart failure Father    Heart disease Sister        h/o MI   Migraines Sister    Breast cancer Neg Hx    Social History  Occupational History   Not on file  Tobacco Use   Smoking status: Former    Current packs/day: 0.00    Average packs/day: 0.1 packs/day for 2.5 years (0.3 ttl pk-yrs)    Types: Cigarettes    Start date: 04/1994    Quit date: 10/13/1996    Years since quitting: 27.9   Smokeless tobacco: Never  Vaping Use   Vaping status: Never Used  Substance and Sexual Activity   Alcohol use: No   Drug use: No   Sexual activity: Not on file   Tobacco Counseling Does not smoke.   SDOH Screenings   Food Insecurity: No Food Insecurity (08/25/2024)  Housing: Low Risk  (08/25/2024)  Transportation Needs: No Transportation Needs (08/25/2024)  Utilities: Not At Risk  (09/14/2023)   Received from [facility]  Depression [phone]): Medium Risk (07/08/2024)  Financial Resource Strain: Low Risk  (08/25/2024)  Physical Activity: Inactive (08/25/2024)  Social Connections: Socially Isolated (08/25/2024)  Stress: Stress Concern Present (08/26/2024)  Tobacco Use: Medium Risk (09/04/2024)   See flowsheets for full screening details  Depression Screen PHQ 2 & 9 Depression Scale- Over the past 2 weeks, how often have you been bothered by any of the following problems? Little interest or pleasure in doing things: 1 Feeling down, depressed, or hopeless (PHQ Adolescent also includes...irritable): 1 PHQ-2 Total Score: 2 Trouble falling or staying asleep, or sleeping too much: 1 Feeling tired or having little energy: 1 Poor appetite or overeating (PHQ Adolescent also includes...weight loss): 1 Feeling bad about yourself - or that you are a failure or have let yourself or your family down: 1 Trouble concentrating on things, such as reading the newspaper or watching television (PHQ Adolescent also includes...like school work): 1 Moving or speaking so slowly that other people could have noticed. Or the opposite - being so fidgety or restless that you have been moving around a lot more than usual: 1 Thoughts that you would be better off dead, or of hurting yourself in some way: 0 PHQ-9 Total Score: 8 If you checked off any problems, how difficult have these problems made it for you to do your work, take care of things at home, or get along with other people?: Somewhat difficult  Depression Treatment Depression Interventions/Treatment : Medication  On citalopram . Overall appears to be handling things well. Follow.    Visit info / Clinical Intake: Medicare Wellness Visit Type:: Welcome to [employer] (IPPE) Persons participating in visit:: patient Medicare Wellness Visit Mode:: In-person (required for WTM) Information given by:: patient Interpreter  Needed?: No Pre-visit prep was completed: yes AWV questionnaire completed by patient prior to visit?: yes Date:: 08/26/24 Living arrangements:: lives with spouse/significant other Patient's Overall Health Status Rating: good Typical amount of pain: some Does pain affect daily life?: no Are you currently prescribed opioids?: no  Dietary Habits and Nutritional Risks How many meals a day?: 3 Eats fruit and vegetables daily?: yes Most meals are obtained by: preparing own meals In the last 2 weeks, have you had any of the following?: none Diabetic:: (!) yes Any non-healing wounds?: no How often do you check your BS?: 3 Would you like to be referred to a Nutritionist or for Diabetic Management? : no  Functional Status Activities of Daily Living (to include ambulation/medication): Independent Ambulation: Independent Medication Administration: Independent Home Management: Independent Manage your own finances?: yes Primary transportation is: driving Concerns about vision?: no *vision screening is required for WTM* Concerns about hearing?: no  Fall Screening Falls  in the past year?: 1 Number of falls in past year: 1 Was there an injury with Fall?: 1 Fall Risk Category Calculator: 3 Patient Fall Risk Level: High Fall Risk  Fall Risk Patient at Risk for Falls Due to: History of fall(s) Fall risk Follow up: Falls evaluation completed  Home and Transportation Safety: All rugs have non-skid backing?: N/A, no rugs All stairs or steps have railings?: yes Grab bars in the bathtub or shower?: (!) no Have non-skid surface in bathtub or shower?: yes Good home lighting?: yes Regular seat belt use?: yes Hospital stays in the last year:: (!) yes How many hospital stays:: 2  Cognitive Assessment Difficulty concentrating, remembering, or making decisions? : no Will 6CIT or Mini Cog be Completed: yes  Advance Directives (For Healthcare) Does Patient Have a Medical Advance Directive?:  Yes Does patient want to make changes to medical advance directive?: Yes (MAU/Ambulatory/Procedural Areas - Information given) Type of Advance Directive: Healthcare Power of [place]; Living will Copy of Healthcare Power of Attorney in Chart?: Yes - validated most recent copy scanned in chart (See row information) Copy of Living Will in Chart?: Yes - validated most recent copy scanned in chart (See row information) Would patient like information on creating a medical advance directive?: No - Patient declined  Reviewed/Updated  Reviewed/Updated: Reviewed All (Medical, Surgical, Family, Medications, Allergies, Care Teams, Patient Goals); Family History; Medications; Allergies; Medical History; Surgical History         Objective:    Today's Vitals   08/26/24 1510 09/04/24 1505  BP: (!) 140/82 130/82  Pulse: 78   Temp: 98.9 F (37.2 C)   TempSrc: Oral   SpO2: 97%   Weight: 273 lb (123.8 kg)   Height: 5' 7 (1.702 m)    Body mass index is 42.76 kg/m.   Physical Exam Constitutional:      General: She is not in acute distress.    Appearance: Normal appearance.  HENT:     Head: Normocephalic and atraumatic.     Mouth/Throat:     Pharynx: No oropharyngeal exudate or posterior oropharyngeal erythema.  Cardiovascular:     Rate and Rhythm: Normal rate and regular rhythm.  Pulmonary:     Effort: No respiratory distress.     Breath sounds: Normal breath sounds. No wheezing.  Abdominal:     General: Bowel sounds are normal.     Palpations: Abdomen is soft.     Tenderness: There is no abdominal tenderness.  Musculoskeletal:        General: No swelling or tenderness.     Cervical back: Neck supple. No tenderness.  Lymphadenopathy:     Cervical: No cervical adenopathy.  Skin:    Findings: No erythema or rash.  Neurological:     Mental Status: She is alert.    Physical Exam Constitutional:      General: She is not in acute distress.    Appearance: Normal appearance.  HENT:      Head: Normocephalic and atraumatic.     Mouth/Throat:     Pharynx: No oropharyngeal exudate or posterior oropharyngeal erythema.  Cardiovascular:     Rate and Rhythm: Normal rate and regular rhythm.  Pulmonary:     Effort: No respiratory distress.     Breath sounds: Normal breath sounds. No wheezing.  Abdominal:     General: Bowel sounds are normal.     Palpations: Abdomen is soft.     Tenderness: There is no abdominal tenderness.  Musculoskeletal:  General: No swelling or tenderness.     Cervical back: Neck supple. No tenderness.  Lymphadenopathy:     Cervical: No cervical adenopathy.  Skin:    Findings: No erythema or rash.  Neurological:     Mental Status: She is alert.      Current Medications (verified) Outpatient Encounter Medications as of 08/26/2024  Medication Sig   acetaminophen  (TYLENOL ) 650 MG CR tablet Take 650 mg by mouth every 8 (eight) hours as needed for pain.   alendronate  (FOSAMAX ) 70 MG tablet Take 1 tablet (70 mg total) by mouth every 7 (seven) days. Take with a full glass of water on an empty stomach.   amLODipine  (NORVASC ) 10 MG tablet Take 1 tablet (10 mg total) by mouth daily.   Blood Glucose Monitoring Suppl DEVI Use as directed to check blood sugars twice daily.   glucose blood (FREESTYLE LITE) test strip USE  STRIP TO CHECK GLUCOSE ONCE DAILY   Lancets Misc. MISC Use as directed to check blood sugars twice daily.   rosuvastatin  (CRESTOR ) 40 MG tablet Take 1 tablet (40 mg total) by mouth daily.   spironolactone  (ALDACTONE ) 25 MG tablet Take 1 tablet (25 mg total) by mouth daily.   busPIRone  (BUSPAR ) 5 MG tablet Take 1 tablet (5 mg total) by mouth 2 (two) times daily.   citalopram  (CELEXA ) 40 MG tablet Take 1 tablet (40 mg total) by mouth daily.   pantoprazole  (PROTONIX ) 40 MG tablet Take 1 tablet (40 mg total) by mouth daily.   [DISCONTINUED] busPIRone  (BUSPAR ) 5 MG tablet Take 1 tablet (5 mg total) by mouth 2 (two) times daily.    [DISCONTINUED] citalopram  (CELEXA ) 40 MG tablet Take 1 tablet (40 mg total) by mouth daily.   [DISCONTINUED] pantoprazole  (PROTONIX ) 40 MG tablet Take 1 tablet (40 mg total) by mouth daily.   No facility-administered encounter medications on file as of 08/26/2024.   Hearing/Vision screen Vision Screening   Right eye Left eye Both eyes  Without correction 20/50 20/40 20/50   With correction      Immunizations and Health Maintenance Health Maintenance  Topic Date Due   Medicare Annual Wellness (AWV)  Never done   HIV Screening  Never done   Hepatitis C Screening  Never done   Pneumococcal Vaccine: 50+ Years (1 of 1 - PCV) Never done   Zoster Vaccines- Shingrix (1 of 2) Never done   DTaP/Tdap/Td (2 - Td or Tdap) 05/03/2023   COVID-19 Vaccine (4 - 2025-26 season) 06/13/2024   Mammogram  09/16/2025   Colonoscopy  08/25/2028   Influenza Vaccine  Completed   Bone Density Scan  Completed   Hepatitis B Vaccines 19-59 Average Risk  Aged Out   Meningococcal B Vaccine  Aged Out    Discussed pneumonia vaccine and shingles vaccine.   EKG: normal EKG, normal sinus rhythm, unchanged from previous tracings     Assessment/Plan:  This is a routine wellness examination for Heather Blake.  Patient Care Team: Glendia Shad, MD as PCP - General (Internal Medicine) Darliss Rogue, MD as PCP - Cardiology (Cardiology)  I have personally reviewed and noted the following in the patient's chart:   Medical and social history Use of alcohol, tobacco or illicit drugs  Current medications and supplements including opioid prescriptions. Functional ability and status Nutritional status Physical activity Advanced directives List of other physicians Hospitalizations, surgeries, and ER visits in previous 12 months Vitals Screenings to include cognitive, depression, and falls Referrals and appointments  Orders Placed This Encounter  Procedures   Hepatic function panel    Standing Status:   Future     Expiration Date:   08/23/2025   Basic metabolic panel    Standing Status:   Future    Expiration Date:   08/23/2025   Hemoglobin A1c    Standing Status:   Future    Expiration Date:   08/23/2025   CBC with Differential/Platelet    Standing Status:   Future    Expiration Date:   08/24/2025   Lipid panel    Standing Status:   Future    Expiration Date:   08/23/2025   EKG 12-Lead   In addition, I have reviewed and discussed with patient certain preventive protocols, quality metrics, and best practice recommendations. A written personalized care plan for preventive services as well as general preventive health recommendations were provided to patient.   Allena Hamilton, MD   09/04/2024   Return in 1 year (on 08/26/2025) for annual wellness visit.  SABRA

## 2024-08-29 ENCOUNTER — Ambulatory Visit: Payer: Self-pay | Admitting: Internal Medicine

## 2024-09-04 ENCOUNTER — Encounter: Payer: Self-pay | Admitting: Internal Medicine

## 2024-09-04 MED ORDER — BUSPIRONE HCL 5 MG PO TABS: 5.0000 mg | ORAL_TABLET | Freq: Two times a day (BID) | ORAL | 1 refills | Status: AC

## 2024-09-04 MED ORDER — CITALOPRAM HYDROBROMIDE 40 MG PO TABS: 40.0000 mg | ORAL_TABLET | Freq: Every day | ORAL | 1 refills | Status: AC

## 2024-09-04 MED ORDER — PANTOPRAZOLE SODIUM 40 MG PO TBEC
40.0000 mg | DELAYED_RELEASE_TABLET | Freq: Every day | ORAL | 1 refills | Status: AC
Start: 2024-09-04 — End: ?

## 2024-09-04 NOTE — Assessment & Plan Note (Signed)
 Continue cpap.

## 2024-09-04 NOTE — Assessment & Plan Note (Signed)
 Low carb diet and exercise. Follow met b and A1c.  Lab Results  Component Value Date   HGBA1C 6.0 05/20/2024

## 2024-09-04 NOTE — Assessment & Plan Note (Signed)
 Continue aldactone . Blood pressure as outlined. No change in medication today.

## 2024-09-04 NOTE — Assessment & Plan Note (Signed)
 On crestor .  Low-cholesterol diet and exercise. Follow lipid panel.  Lab Results  Component Value Date   CHOL 138 05/20/2024   HDL 59.00 05/20/2024   LDLCALC 57 05/20/2024   LDLDIRECT 73.0 12/26/2022   TRIG 107.0 05/20/2024   CHOLHDL 2 05/20/2024

## 2024-09-19 ENCOUNTER — Encounter

## 2024-09-21 ENCOUNTER — Other Ambulatory Visit

## 2024-09-22 ENCOUNTER — Other Ambulatory Visit

## 2024-09-22 DIAGNOSIS — I10 Essential (primary) hypertension: Secondary | ICD-10-CM

## 2024-09-22 DIAGNOSIS — R739 Hyperglycemia, unspecified: Secondary | ICD-10-CM

## 2024-09-22 DIAGNOSIS — E7800 Pure hypercholesterolemia, unspecified: Secondary | ICD-10-CM

## 2024-09-22 LAB — LIPID PANEL
Cholesterol: 131 mg/dL (ref 0–200)
HDL: 57.8 mg/dL (ref >39.00)
LDL Cholesterol: 54 mg/dL (ref 0–99)
NonHDL: 73.61
Total CHOL/HDL Ratio: 2
Triglycerides: 99 mg/dL (ref 0.0–149.0)
VLDL: 19.8 mg/dL (ref 0.0–40.0)

## 2024-09-22 LAB — CBC WITH DIFFERENTIAL/PLATELET
Basophils Absolute: 0.1 K/uL (ref 0.0–0.1)
Basophils Relative: 0.8 % (ref 0.0–3.0)
Eosinophils Absolute: 0.1 K/uL (ref 0.0–0.7)
Eosinophils Relative: 1.4 % (ref 0.0–5.0)
HCT: 40.1 % (ref 36.0–46.0)
Hemoglobin: 13.7 g/dL (ref 12.0–15.0)
Lymphocytes Relative: 36.6 % (ref 12.0–46.0)
Lymphs Abs: 2.9 K/uL (ref 0.7–4.0)
MCHC: 34.3 g/dL (ref 30.0–36.0)
MCV: 92.2 fl (ref 78.0–100.0)
Monocytes Absolute: 0.6 K/uL (ref 0.1–1.0)
Monocytes Relative: 7.9 % (ref 3.0–12.0)
Neutro Abs: 4.2 K/uL (ref 1.4–7.7)
Neutrophils Relative %: 53.3 % (ref 43.0–77.0)
Platelets: 218 K/uL (ref 150.0–400.0)
RBC: 4.35 Mil/uL (ref 3.87–5.11)
RDW: 13.4 % (ref 11.5–15.5)
WBC: 7.8 K/uL (ref 4.0–10.5)

## 2024-09-22 LAB — HEPATIC FUNCTION PANEL
ALT: 27 U/L (ref 0–35)
AST: 34 U/L (ref 0–37)
Albumin: 4.7 g/dL (ref 3.5–5.2)
Alkaline Phosphatase: 43 U/L (ref 39–117)
Bilirubin, Direct: 0.1 mg/dL (ref 0.0–0.3)
Total Bilirubin: 0.6 mg/dL (ref 0.2–1.2)
Total Protein: 7.4 g/dL (ref 6.0–8.3)

## 2024-09-22 LAB — BASIC METABOLIC PANEL WITH GFR
BUN: 11 mg/dL (ref 6–23)
CO2: 27 meq/L (ref 19–32)
Calcium: 9.5 mg/dL (ref 8.4–10.5)
Chloride: 102 meq/L (ref 96–112)
Creatinine, Ser: 0.65 mg/dL (ref 0.40–1.20)
GFR: 92.32 mL/min (ref >60.00)
Glucose, Bld: 101 mg/dL — ABNORMAL HIGH (ref 70–99)
Potassium: 4.2 meq/L (ref 3.5–5.1)
Sodium: 138 meq/L (ref 135–145)

## 2024-09-22 LAB — HEMOGLOBIN A1C: Hgb A1c MFr Bld: 5.7 % (ref 4.6–6.5)

## 2024-09-23 ENCOUNTER — Encounter: Payer: Self-pay | Admitting: Internal Medicine

## 2024-09-23 ENCOUNTER — Ambulatory Visit: Admitting: Internal Medicine

## 2024-09-23 VITALS — BP 132/84 | HR 82 | Temp 98.6°F | Ht 67.0 in | Wt 274.4 lb

## 2024-09-23 DIAGNOSIS — M8580 Other specified disorders of bone density and structure, unspecified site: Secondary | ICD-10-CM

## 2024-09-23 DIAGNOSIS — E7800 Pure hypercholesterolemia, unspecified: Secondary | ICD-10-CM

## 2024-09-23 DIAGNOSIS — F419 Anxiety disorder, unspecified: Secondary | ICD-10-CM

## 2024-09-23 DIAGNOSIS — G4730 Sleep apnea, unspecified: Secondary | ICD-10-CM | POA: Diagnosis not present

## 2024-09-23 DIAGNOSIS — R739 Hyperglycemia, unspecified: Secondary | ICD-10-CM | POA: Diagnosis not present

## 2024-09-23 DIAGNOSIS — K219 Gastro-esophageal reflux disease without esophagitis: Secondary | ICD-10-CM | POA: Diagnosis not present

## 2024-09-23 DIAGNOSIS — D649 Anemia, unspecified: Secondary | ICD-10-CM

## 2024-09-23 DIAGNOSIS — I10 Essential (primary) hypertension: Secondary | ICD-10-CM | POA: Diagnosis not present

## 2024-09-23 MED ORDER — SPIRONOLACTONE 25 MG PO TABS: 25.0000 mg | ORAL_TABLET | Freq: Every day | ORAL | 3 refills | Status: AC

## 2024-09-23 MED ORDER — AMLODIPINE BESYLATE 10 MG PO TABS: 10.0000 mg | ORAL_TABLET | Freq: Every day | ORAL | 3 refills | Status: AC

## 2024-09-23 MED ORDER — ROSUVASTATIN CALCIUM 40 MG PO TABS: 40.0000 mg | ORAL_TABLET | Freq: Every day | ORAL | 3 refills | Status: AC

## 2024-09-23 NOTE — Progress Notes (Unsigned)
 Subjective:    [name], female    DOB: 1961/09/13, 63 y.o.   MRN: [id number]  Patient here for  Chief Complaint  Patient presents with   chronic medical conditions    HPI Here for a scheduled follow up - follow up regarding hypertension and hypercholesterolemia. Also with increased stress. Continues on citalopram . Had f/u with ortho 03/23/24 - s/p ORIF right wrist. Stable. Bone density 06/27/24 - revealed osteopenia. Frax 10 year probability of fracture - Major osteoporotic fracture 22.2%. hip fracture .8%. T score -1.3 lumbar spine and left femoral neck -1.0. elected to hold on prescription medication. Has been watching her diet. Decreased carb intake. Discussed labs - improved. Handling stress. No chest pain or sob reported. No abdominal pain or bowel change reported.    Past Medical History:  Diagnosis Date   Allergy    Anemia    Cancer (HCC)    Depression    Diverticulosis    FHx: migraine headaches    GERD (gastroesophageal reflux disease)    History of chickenpox    Hypercholesterolemia    Hyperlipidemia    Hypertension    Morbid obesity with BMI of 40.0-44.9, adult (HCC)    Motion sickness    ocean boats   OSA on CPAP    Osteoarthritis    Panic attacks    Sleep apnea    CPAP   Vertigo    none for over 10 yrs   Past Surgical History:  Procedure Laterality Date   ABDOMINAL HYSTERECTOMY     CATARACT EXTRACTION W/PHACO Left 08/11/2017   Procedure: CATARACT EXTRACTION PHACO AND INTRAOCULAR LENS PLACEMENT (IOC);  Surgeon: Jaye Fallow, MD;  Location: [facility];  Service: Ophthalmology;  Laterality: Left;  US  00:31 AP% 19.2 CDE 6.13 Fluid Pack lot # [id number]   CATARACT EXTRACTION W/PHACO Right 09/01/2017   Procedure: CATARACT EXTRACTION PHACO AND INTRAOCULAR LENS PLACEMENT (IOC);  Surgeon: Jaye Fallow, MD;  Location: [facility];  Service: Ophthalmology;  Laterality: Right;  US  00:30 AP% 11.7 CDE3.52 fluid pack lot #[id number]   Child Birth   1981 and 1979   COLONOSCOPY     COLONOSCOPY WITH PROPOFOL  N/A 08/25/2018   Procedure: COLONOSCOPY WITH PROPOFOL ;  Surgeon: Toledo, Ladell POUR, MD;  Location: [facility];  Service: Gastroenterology;  Laterality: N/A;   ESOPHAGOGASTRODUODENOSCOPY     ESOPHAGOGASTRODUODENOSCOPY (EGD) WITH PROPOFOL  N/A 08/25/2018   Procedure: ESOPHAGOGASTRODUODENOSCOPY (EGD) WITH PROPOFOL ;  Surgeon: Toledo, Ladell POUR, MD;  Location: [facility];  Service: Gastroenterology;  Laterality: N/A;   FRACTURE SURGERY     HARDWARE REMOVAL Right 12/29/2023   Procedure: REMOVAL, HARDWARE;  Surgeon: Kathlynn Sharper, MD;  Location: [facility];  Service: Orthopedics;  Laterality: Right;  Right wrist DVR plate removal 3 screws and 7 pegs   LAPAROSCOPIC SUPRACERVICAL HYSTERECTOMY  2006   ovaries left in place   left elbow     left knee     Miscarriage  1987   OPEN REDUCTION INTERNAL FIXATION (ORIF) DISTAL RADIAL FRACTURE Right 08/28/2023   Procedure: Open reduction & internal fixation of right distal radius and right carpal tunnel release;  Surgeon: Kathlynn Sharper, MD;  Location: [facility];  Service: Orthopedics;  Laterality: Right;   TONSILLECTOMY  1963   TUBAL LIGATION  1993   Family History  Problem Relation Age of Onset   Heart disease Mother        Incomplete heart block   Hypertension Mother    Diabetes Mother  Nephrolithiasis Mother    Migraines Mother    Arthritis Mother        degenerative-back,osteoporosis   Heart failure Mother    Heart disease Father 79       Myocardial infarction   Heart failure Father    Heart disease Sister        h/o MI   Migraines Sister    Breast cancer Neg Hx    Social History   Socioeconomic History   Marital status: Married    Spouse name: Not on file   Number of children: Not on file   Years of education: Not on file   Highest education level: 12th grade  Occupational History   Not on file  Tobacco Use   Smoking status: Former    Current  packs/day: 0.00    Average packs/day: 0.1 packs/day for 2.5 years (0.3 ttl pk-yrs)    Types: Cigarettes    Start date: 04/1994    Quit date: 10/13/1996    Years since quitting: 27.9   Smokeless tobacco: Never  Vaping Use   Vaping status: Never Used  Substance and Sexual Activity   Alcohol use: No   Drug use: No   Sexual activity: Not on file  Other Topics Concern   Not on file  Social History Narrative   Not on file   Social Drivers of Health   Tobacco Use: Medium Risk (09/25/2024)   Patient History    Smoking Tobacco Use: Former    Smokeless Tobacco Use: Never    Passive Exposure: Not on Actuary Strain: Low Risk (08/25/2024)   Overall Financial Resource Strain (CARDIA)    Difficulty of Paying Living Expenses: Not very hard  Food Insecurity: No Food Insecurity (08/25/2024)   Epic    Worried About Programme Researcher, Broadcasting/film/video in the Last Year: Never true    Ran Out of Food in the Last Year: Never true  Transportation Needs: No Transportation Needs (08/25/2024)   Epic    Lack of Transportation (Medical): No    Lack of Transportation (Non-Medical): No  Physical Activity: Inactive (08/25/2024)   Exercise Vital Sign    Days of Exercise per Week: 0 days    Minutes of Exercise per Session: Not on file  Stress: Stress Concern Present (08/26/2024)   [employer] of Occupational Health - Occupational Stress Questionnaire    Feeling of Stress: To some extent  Social Connections: Socially Isolated (08/25/2024)   Social Connection and Isolation Panel    Frequency of Communication with Friends and Family: Once a week    Frequency of Social Gatherings with Friends and Family: Never    Attends Religious Services: Never    Database Administrator or Organizations: No    Attends Engineer, Structural: Not on file    Marital Status: Married  Depression (PHQ2-9): Medium Risk (09/23/2024)   Depression (PHQ2-9)    PHQ-2 Score: 8  Alcohol Screen: Not on file   Housing: Low Risk (08/25/2024)   Epic    Unable to Pay for Housing in the Last Year: No    Number of Times Moved in the Last Year: 0    Homeless in the Last Year: No  Utilities: Not At Risk (09/14/2023)   Received from [facility] Utilities    Threatened with loss of utilities: No  Health Literacy: Not on file     Review of Systems  Constitutional:  Negative for appetite change and  unexpected weight change.  HENT:  Negative for congestion and sinus pressure.   Respiratory:  Negative for cough, chest tightness and shortness of breath.   Cardiovascular:  Negative for chest pain, palpitations and leg swelling.  Gastrointestinal:  Negative for abdominal pain, diarrhea, nausea and vomiting.  Genitourinary:  Negative for difficulty urinating and dysuria.  Musculoskeletal:  Negative for joint swelling and myalgias.  Skin:  Negative for color change and rash.  Neurological:  Negative for dizziness and headaches.  Psychiatric/Behavioral:  Negative for agitation and dysphoric mood.        Objective:     BP 132/84   Pulse 82   Temp 98.6 F (37 C)   Ht 5' 7 (1.702 m)   Wt 274 lb 6.4 oz (124.5 kg)   SpO2 98%   BMI 42.98 kg/m  Wt Readings from Last 3 Encounters:  09/23/24 274 lb 6.4 oz (124.5 kg)  08/26/24 273 lb (123.8 kg)  07/08/24 272 lb 3.2 oz (123.5 kg)    Physical Exam Vitals reviewed.  Constitutional:      General: She is not in acute distress.    Appearance: Normal appearance.  HENT:     Head: Normocephalic and atraumatic.     Right Ear: External ear normal.     Left Ear: External ear normal.     Mouth/Throat:     Pharynx: No oropharyngeal exudate or posterior oropharyngeal erythema.  Eyes:     General: No scleral icterus.       Right eye: No discharge.        Left eye: No discharge.     Conjunctiva/sclera: Conjunctivae normal.  Neck:     Thyroid : No thyromegaly.  Cardiovascular:     Rate and Rhythm: Normal rate and regular rhythm.   Pulmonary:     Effort: No respiratory distress.     Breath sounds: Normal breath sounds. No wheezing.  Abdominal:     General: Bowel sounds are normal.     Palpations: Abdomen is soft.     Tenderness: There is no abdominal tenderness.  Musculoskeletal:        General: No swelling or tenderness.     Cervical back: Neck supple. No tenderness.  Lymphadenopathy:     Cervical: No cervical adenopathy.  Skin:    Findings: No erythema or rash.  Neurological:     Mental Status: She is alert.  Psychiatric:        Mood and Affect: Mood normal.        Behavior: Behavior normal.         Outpatient Encounter Medications as of 09/23/2024  Medication Sig   acetaminophen  (TYLENOL ) 650 MG CR tablet Take 650 mg by mouth every 8 (eight) hours as needed for pain.   alendronate  (FOSAMAX ) 70 MG tablet Take 1 tablet (70 mg total) by mouth every 7 (seven) days. Take with a full glass of water on an empty stomach.   Blood Glucose Monitoring Suppl DEVI Use as directed to check blood sugars twice daily.   busPIRone  (BUSPAR ) 5 MG tablet Take 1 tablet (5 mg total) by mouth 2 (two) times daily.   citalopram  (CELEXA ) 40 MG tablet Take 1 tablet (40 mg total) by mouth daily.   glucose blood (FREESTYLE LITE) test strip USE  STRIP TO CHECK GLUCOSE ONCE DAILY   Lancets Misc. MISC Use as directed to check blood sugars twice daily.   pantoprazole  (PROTONIX ) 40 MG tablet Take 1 tablet (40 mg total) by mouth daily.  amLODipine  (NORVASC ) 10 MG tablet Take 1 tablet (10 mg total) by mouth daily.   rosuvastatin  (CRESTOR ) 40 MG tablet Take 1 tablet (40 mg total) by mouth daily.   spironolactone  (ALDACTONE ) 25 MG tablet Take 1 tablet (25 mg total) by mouth daily.   [DISCONTINUED] amLODipine  (NORVASC ) 10 MG tablet Take 1 tablet (10 mg total) by mouth daily.   [DISCONTINUED] rosuvastatin  (CRESTOR ) 40 MG tablet Take 1 tablet (40 mg total) by mouth daily.   [DISCONTINUED] spironolactone  (ALDACTONE ) 25 MG tablet Take 1  tablet (25 mg total) by mouth daily.   No facility-administered encounter medications on file as of 09/23/2024.     Lab Results  Component Value Date   WBC 7.8 09/22/2024   HGB 13.7 09/22/2024   HCT 40.1 09/22/2024   PLT 218.0 09/22/2024   GLUCOSE 101 (H) 09/22/2024   CHOL 131 09/22/2024   TRIG 99.0 09/22/2024   HDL 57.80 09/22/2024   LDLDIRECT 73.0 12/26/2022   LDLCALC 54 09/22/2024   ALT 27 09/22/2024   AST 34 09/22/2024   NA 138 09/22/2024   K 4.2 09/22/2024   CL 102 09/22/2024   CREATININE 0.65 09/22/2024   BUN 11 09/22/2024   CO2 27 09/22/2024   TSH 1.17 02/15/2024   HGBA1C 5.7 09/22/2024    DG Bone Density Result Date: 06/27/2024 EXAM: DUAL X-RAY ABSORPTIOMETRY (DXA) FOR BONE MINERAL DENSITY 06/27/2024 9:40 am CLINICAL DATA:  63 year old Female Postmenopausal. Estrogen deficiency History of fragility fracture. TECHNIQUE: An axial (e.g., hips, spine) and/or appendicular (e.g., radius) exam was performed, as appropriate, using GE Secretary/administrator at [facility]. Images are obtained for bone mineral density measurement and are not obtained for diagnostic purposes. [id number] Exclusions: None. COMPARISON:  None. FINDINGS: Scan quality: Good. LUMBAR SPINE (L1-L4): BMD (in g/cm2): 1.031 T-score: -1.3 Z-score: 0.3 LEFT FEMORAL NECK: BMD (in g/cm2): 0.899 T-score: -1.0 Z-score: 0.5 LEFT TOTAL HIP: BMD (in g/cm2): 0.966 T-score: -0.3 Z-score: 0.9 RIGHT FEMORAL NECK: BMD (in g/cm2): 0.933 T-score: -0.8 Z-score: 0.7 RIGHT TOTAL HIP: BMD (in g/cm2): 0.933 T-score: -0.6 Z-score: 0.6 FRAX 10-YEAR PROBABILITY OF FRACTURE: 10-year fracture risk is performed using the [employer] FRAX calculator based on patient-reported risk factors. Major osteoporotic fracture: 22.2% Hip fracture: 0.8% Other situations known to alter the reliability of the FRAX score should be considered when making treatment decisions, including chronic glucocorticoid use and past  treatments. Further guidance on treatment can be found at the [facility]opor[facility] Foundation's website https://www.patton.com/. IMPRESSION: Osteopenia based on BMD. Fracture risk is increased. Increased risk is based on low BMD, FRAX calculation and history of fragility fracture. RECOMMENDATIONS: 1. All patients should optimize calcium  and vitamin D intake. 2. Consider FDA-approved medical therapies in postmenopausal women and men aged 18 years and older, based on the following: - A hip or vertebral (clinical or morphometric) fracture - T-score less than or equal to -2.5 and secondary causes have been excluded. - Low bone mass (T-score between -1.0 and -2.5) and a 10-year probability of a hip fracture greater than or equal to 3% or a 10-year probability of a major osteoporosis-related fracture greater than or equal to 20% based on the [place] -adapted WHO algorithm. - Clinician judgment and/or patient preferences may indicate treatment for people with 10-year fracture probabilities above or below these levels 3. Patients with diagnosis of osteoporosis or at high risk for fracture should have regular bone mineral density tests. For patients eligible for Medicare, routine testing is allowed once every 2  years. The testing frequency can be increased to one year for patients who have rapidly progressing disease, those who are receiving or discontinuing medical therapy to restore bone mass, or have additional risk factors. Electronically Signed   By: Dina  Arceo M.D.   On: 06/27/2024 12:41       Assessment & Plan:  Sleep apnea, unspecified type Assessment & Plan: CPAP.    Osteopenia, unspecified location Assessment & Plan:  Bone density 06/27/24 - revealed osteopenia. Frax 10 year probability of fracture - Major osteoporotic fracture 22.2%. hip fracture .8%. T score -1.3 lumbar spine and left femoral neck -1.0. elected to hold on prescription medication.    Hyperglycemia Assessment & Plan: Low carb diet and exercise.  Follow met b and A1c.  Lab Results  Component Value Date   HGBA1C 5.7 09/22/2024      Hypercholesterolemia Assessment & Plan: On crestor .  Low-cholesterol diet and exercise. Follow lipid panel.  Lab Results  Component Value Date   CHOL 131 09/22/2024   HDL 57.80 09/22/2024   LDLCALC 54 09/22/2024   LDLDIRECT 73.0 12/26/2022   TRIG 99.0 09/22/2024   CHOLHDL 2 09/22/2024     Gastroesophageal reflux disease, unspecified whether esophagitis present Assessment & Plan: No upper symptoms - on protonix . If remains without symptoms, consider decreasing dose next visit.    Essential hypertension, benign Assessment & Plan: Continue aldactone . Blood pressure as outlined. Follow metabolic panel. No change in medication today.    Anxiety Assessment & Plan: Increased stress with her husband's health issues. Continue citalopram . Overall reports she is handling things relatively well. Follow.    Anemia, unspecified type Assessment & Plan: Hgb wnl 09/22/24.    Severe obesity (BMI >= 40) (HCC) Assessment & Plan: Diet and exercise. Follow.    Other orders -     amLODIPine  Besylate; Take 1 tablet (10 mg total) by mouth daily.  Dispense: 90 tablet; Refill: 3 -     Rosuvastatin  Calcium ; Take 1 tablet (40 mg total) by mouth daily.  Dispense: 90 tablet; Refill: 3 -     Spironolactone ; Take 1 tablet (25 mg total) by mouth daily.  Dispense: 90 tablet; Refill: 3     Allena Hamilton, MD

## 2024-09-25 ENCOUNTER — Encounter: Payer: Self-pay | Admitting: Internal Medicine

## 2024-09-25 NOTE — Assessment & Plan Note (Signed)
 Continue aldactone . Blood pressure as outlined. Follow metabolic panel. No change in medication today.

## 2024-09-25 NOTE — Assessment & Plan Note (Signed)
 CPAP.

## 2024-09-25 NOTE — Assessment & Plan Note (Signed)
Diet and exercise.  Follow.  

## 2024-09-25 NOTE — Assessment & Plan Note (Signed)
 Increased stress with her husband's health issues. Continue citalopram . Overall reports she is handling things relatively well. Follow.

## 2024-09-25 NOTE — Assessment & Plan Note (Signed)
 No upper symptoms - on protonix . If remains without symptoms, consider decreasing dose next visit.

## 2024-09-25 NOTE — Assessment & Plan Note (Signed)
 On crestor .  Low-cholesterol diet and exercise. Follow lipid panel.  Lab Results  Component Value Date   CHOL 131 09/22/2024   HDL 57.80 09/22/2024   LDLCALC 54 09/22/2024   LDLDIRECT 73.0 12/26/2022   TRIG 99.0 09/22/2024   CHOLHDL 2 09/22/2024

## 2024-09-25 NOTE — Assessment & Plan Note (Signed)
 Hgb wnl 09/22/24.

## 2024-09-25 NOTE — Assessment & Plan Note (Signed)
 Low carb diet and exercise. Follow met b and A1c.  Lab Results  Component Value Date   HGBA1C 5.7 09/22/2024

## 2024-09-25 NOTE — Assessment & Plan Note (Signed)
 Bone density 06/27/24 - revealed osteopenia. Frax 10 year probability of fracture - Major osteoporotic fracture 22.2%. hip fracture .8%. T score -1.3 lumbar spine and left femoral neck -1.0. elected to hold on prescription medication.

## 2024-09-28 ENCOUNTER — Inpatient Hospital Stay: Admission: RE | Admit: 2024-09-28 | Discharge: 2024-09-28 | Attending: Internal Medicine | Admitting: Internal Medicine

## 2024-09-28 DIAGNOSIS — Z1231 Encounter for screening mammogram for malignant neoplasm of breast: Secondary | ICD-10-CM | POA: Insufficient documentation

## 2024-11-16 ENCOUNTER — Encounter: Payer: Self-pay | Admitting: Internal Medicine

## 2024-11-16 NOTE — Telephone Encounter (Signed)
 Ok. We can do labs the same day as her appt.

## 2024-12-28 ENCOUNTER — Other Ambulatory Visit

## 2024-12-30 ENCOUNTER — Ambulatory Visit: Admitting: Internal Medicine
